# Patient Record
Sex: Male | Born: 1950 | Hispanic: No | Smoking: Never smoker
Health system: Southern US, Community
[De-identification: ages and names within clinical notes are randomized; demographics above are authoritative.]

## PROBLEM LIST (undated history)

## (undated) DIAGNOSIS — R519 Headache, unspecified: Secondary | ICD-10-CM

## (undated) DIAGNOSIS — I82409 Acute embolism and thrombosis of unspecified deep veins of unspecified lower extremity: Secondary | ICD-10-CM

## (undated) DIAGNOSIS — E785 Hyperlipidemia, unspecified: Secondary | ICD-10-CM

## (undated) DIAGNOSIS — S99921A Unspecified injury of right foot, initial encounter: Secondary | ICD-10-CM

## (undated) DIAGNOSIS — G8929 Other chronic pain: Secondary | ICD-10-CM

## (undated) DIAGNOSIS — K635 Polyp of colon: Secondary | ICD-10-CM

## (undated) DIAGNOSIS — C4491 Basal cell carcinoma of skin, unspecified: Secondary | ICD-10-CM

## (undated) HISTORY — DX: Unspecified injury of right foot, initial encounter: S99.921A

## (undated) HISTORY — DX: Basal cell carcinoma of skin, unspecified: C44.91

## (undated) HISTORY — DX: Acute embolism and thrombosis of unspecified deep veins of unspecified lower extremity: I82.409

## (undated) HISTORY — PX: NO PAST SURGERIES: SHX2092

## (undated) HISTORY — DX: Headache, unspecified: R51.9

## (undated) HISTORY — DX: Polyp of colon: K63.5

## (undated) HISTORY — PX: HERNIA REPAIR: SHX51

## (undated) HISTORY — DX: Other chronic pain: G89.29

## (undated) HISTORY — DX: Hyperlipidemia, unspecified: E78.5

---

## 2010-12-24 ENCOUNTER — Ambulatory Visit
Admission: RE | Admit: 2010-12-24 | Discharge: 2010-12-24 | Payer: Self-pay | Source: Home / Self Care | Attending: Cardiovascular Disease | Admitting: Cardiovascular Disease

## 2010-12-24 DIAGNOSIS — I82409 Acute embolism and thrombosis of unspecified deep veins of unspecified lower extremity: Secondary | ICD-10-CM | POA: Insufficient documentation

## 2011-01-08 ENCOUNTER — Ambulatory Visit: Admission: RE | Admit: 2011-01-08 | Discharge: 2011-01-08 | Payer: Self-pay | Source: Home / Self Care

## 2011-01-08 ENCOUNTER — Encounter: Payer: Self-pay | Admitting: Cardiovascular Disease

## 2011-01-14 NOTE — Assessment & Plan Note (Signed)
Summary: NPV/PT HAS BLOOD CLOT IN LEG   Visit Type:  Initial Consult Primary Provider:  none  CC:  right leg blood clot.  History of Present Illness: 60 year-old male presents for initial evaluation of DVT. This was first diagnosed in [place] over one year ago. The patient presented with chronic leg swelling at the time of diagnosis. He had a right foot injury 10 years ago involving a lawnmower and has some degree of swelling ever since then. However the right leg has been more swollen over the past one year. The patient reports no change in leg swelling since warfarin was started. Since the patient has moved to this country several months ago, his INR's have not been checked.   The patient denies chest pain, dyspnea, orthopnea, PND, palpitations, lightheadedness, or syncope. He denies left leg swelling. He has no history of PE or arterial thrombosis.   Current Medications (verified): 1)  Coumadin 2.5 Mg Tabs (Warfarin Sodium) .... Take As Directed  Allergies (verified): No Known Drug Allergies  Past History:  Past medical, surgical, family and social histories (including risk factors) reviewed, and no changes noted (except as noted below).  Past Medical History: None  Past Surgical History: Hernia repair x 3 - remote  Family History: Reviewed history and no changes required. Mother died age 17 from cancer Father died age 70 from MI  Social History: Reviewed history and no changes required. Divorced No children Nonsmoker Occasional Etoh Used to work as a Microbiologist       Negative except as per HPI   Vital Signs:  Patient profile:   60 year old male Height:      70 inches Weight:      210.50 pounds BMI:     30.31 Pulse rate:   64 / minute Pulse rhythm:   regular Resp:     18 per minute BP sitting:   110 / 72  (left arm) Cuff size:   large  Vitals Entered By: Vikki Ports (December 24, 2010 2:07 PM)  Serial Vital Signs/Assessments:  Time       Position  BP       Pulse  Resp  Temp     By           R Arm     110/70                         Vikki Ports   Physical Exam  General:  Pt is alert and oriented, in no acute distress. HEENT: normal Neck: normal carotid upstrokes without bruits, JVP normal Lungs: CTA CV: RRR without murmur or gallop Abd: soft, NT, positive BS, no bruit, no organomegaly Ext: 1+ edema right calf. No thigh edema. Varicosities present bilaterally. Chronic stasis dermatitis left lower leg. 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 He did not have much improvement from warfarin and now has been on chronic anticoagulation for greater than one year (unmonitored for past several months). Fortunately he has had no bleeding problems.  I have recommended repeating a venous duplex of the right leg to assess for chronic DVT. Pending these results will likely discontinue warfarin at this point, especially considering the patient's financial constraints and difficulty with long-term monitoring.  Treatment will likely consist of compression and elevation,  which he is already doing on an as needed basis. Will followup with him when the results of his duplex is available.  Orders: Venous Duplex Lower Extremity (Venous Duplex Lower)  Patient Instructions: 1)  Your physician recommends that you schedule a follow-up appointment as needed. 2)  Your physician recommends that you continue on your current medications as directed. Please refer to the Current Medication list given to you today. 3)  Your physician has requested that you have a RIGHT lower extremity venous duplex.  This test is an ultrasound of the veins in the legs.  It looks at venous blood flow that carries blood from the heart to the legs.  Allow one hour for a Lower Venous exam.  There are no restrictions or  special instructions.

## 2012-06-16 ENCOUNTER — Telehealth: Payer: Self-pay | Admitting: Cardiovascular Disease

## 2012-06-16 DIAGNOSIS — E7800 Pure hypercholesterolemia, unspecified: Secondary | ICD-10-CM

## 2012-06-16 MED ORDER — ROSUVASTATIN CALCIUM 20 MG PO TABS: 20.0000 mg | ORAL_TABLET | Freq: Every day | ORAL | Status: DC

## 2012-06-16 NOTE — Addendum Note (Signed)
Addended by: Dossie Arbour on: 06/16/2012 12:13 PM   Modules accepted: Orders

## 2012-06-16 NOTE — Telephone Encounter (Signed)
Pt is the brother of one of our cath lab nurses. He needs to have Crestor 20 mg po QHS called into [facility] on [address] with 90 pills and 3 refills. Thanks, Media planner

## 2012-07-10 ENCOUNTER — Other Ambulatory Visit: Payer: Self-pay

## 2012-07-10 DIAGNOSIS — I82409 Acute embolism and thrombosis of unspecified deep veins of unspecified lower extremity: Secondary | ICD-10-CM

## 2012-07-14 ENCOUNTER — Other Ambulatory Visit (INDEPENDENT_AMBULATORY_CARE_PROVIDER_SITE_OTHER): Payer: Medicaid Other

## 2012-07-14 DIAGNOSIS — I82409 Acute embolism and thrombosis of unspecified deep veins of unspecified lower extremity: Secondary | ICD-10-CM

## 2012-07-14 LAB — HEPATIC FUNCTION PANEL
Albumin: 4.2 g/dL (ref 3.5–5.2)
Alkaline Phosphatase: 41 U/L (ref 39–117)
Total Protein: 7.2 g/dL (ref 6.0–8.3)

## 2012-07-14 LAB — BASIC METABOLIC PANEL
BUN: 25 mg/dL — ABNORMAL HIGH (ref 6–23)
CO2: 26 mEq/L (ref 19–32)
Calcium: 9.1 mg/dL (ref 8.4–10.5)
GFR: 69.16 mL/min (ref >60.00)
Glucose, Bld: 105 mg/dL — ABNORMAL HIGH (ref 70–99)
Sodium: 138 mEq/L (ref 135–145)

## 2012-07-14 LAB — LIPID PANEL
Cholesterol: 173 mg/dL (ref 0–200)
HDL: 52.6 mg/dL (ref >39.00)

## 2012-07-25 ENCOUNTER — Encounter: Payer: Self-pay | Admitting: Nurse Practitioner

## 2012-07-25 ENCOUNTER — Ambulatory Visit (INDEPENDENT_AMBULATORY_CARE_PROVIDER_SITE_OTHER): Payer: Medicaid Other | Admitting: Nurse Practitioner

## 2012-07-25 VITALS — BP 130/86 | HR 55 | Ht 70.0 in | Wt 209.1 lb

## 2012-07-25 DIAGNOSIS — I82409 Acute embolism and thrombosis of unspecified deep veins of unspecified lower extremity: Secondary | ICD-10-CM

## 2012-07-25 DIAGNOSIS — E785 Hyperlipidemia, unspecified: Secondary | ICD-10-CM

## 2012-07-25 MED ORDER — ATORVASTATIN CALCIUM 40 MG PO TABS: 40.0000 mg | ORAL_TABLET | Freq: Every day | ORAL | Status: DC

## 2012-07-25 NOTE — Assessment & Plan Note (Signed)
His labs are reviewed. I encouraged him to cut back on calories and sugar. Stay active. We will switch him over to generic Lipitor 40 mg. Recheck labs in 3 months. He will see Dr. Excell Seltzer back in one year. Patient is agreeable to this plan and will call if any problems develop in the interim.

## 2012-07-25 NOTE — Addendum Note (Signed)
Addended by: Vista Mink D on: 07/25/2012 09:10 AM   Modules accepted: Orders

## 2012-07-25 NOTE — Assessment & Plan Note (Signed)
Has been having more swelling and some discomfort in the right leg. Will arrange for venous duplex. He is on aspirin. I have encouraged him to use support stockings.

## 2012-07-25 NOTE — Progress Notes (Signed)
   Andrew [name] Date of Birth: 1951-03-06 Medical Record #[id number]  History of Present Illness: Mr. Schubach is seen today for a follow up visit. He is seen for Dr. Excell Seltzer. He has had a history of HLD and prior DVT. Has been off coumadin for over one year. He is here with his brother [name] who works in the cath lab at [employer].   He comes in today. He is doing ok. Tries to walk about every other day. Does like to cook. Has had recent labs which were ok but the Crestor is quite expensive. No chest pain. Not short of breath. Right leg remains swollen and is sore. He wears a brace but no support stocking. He is on aspirin therapy.   Current Outpatient Prescriptions on File Prior to Visit  Medication Sig Dispense Refill  . atorvastatin (LIPITOR) 40 MG tablet Take 1 tablet (40 mg total) by mouth daily.  90 tablet  3    Not on File  Past Medical History  Diagnosis Date  . DVT (deep venous thrombosis)     right leg in 2011; prior injury to right foot  . HLD (hyperlipidemia)   . Right foot injury     History reviewed. No pertinent past surgical history.  History  Smoking status  . Former Smoker  . Types: Cigarettes  . Quit date: 07/25/1982  Smokeless tobacco  . Never Used  Comment: REMOTE SOCIAL HISTORY    History  Alcohol Use No    History reviewed. No pertinent family history.  Review of Systems: The review of systems is per the HPI.  All other systems were reviewed and are negative.  Physical Exam: BP 130/86  Pulse 55  Ht 5\' 10"  (1.778 m)  Wt 209 lb 1.9 oz (94.856 kg)  BMI 30.01 kg/m2 Patient is very pleasant and in no acute distress. He is overweight. Skin is warm and dry. Color is normal.  HEENT is unremarkable. Normocephalic/atraumatic. PERRL. Sclera are nonicteric. Neck is supple. No masses. No JVD. Lungs are clear. Cardiac exam shows a regular rate and rhythm. Abdomen is soft. Extremities are with 1+ edema on the right with brawny stasis changes. Left leg is ok. Gait  and ROM are intact. No gross neurologic deficits noted.   LABORATORY DATA:  Lab Results  Component Value Date   GLUCOSE 105* 07/14/2012   CHOL 173 07/14/2012   TRIG 99.0 07/14/2012   HDL 52.60 07/14/2012   LDLCALC 101* 07/14/2012   ALT 37 07/14/2012   AST 28 07/14/2012   NA 138 07/14/2012   K 5.1 07/14/2012   CL 104 07/14/2012   CREATININE 1.1 07/14/2012   BUN 25* 07/14/2012   CO2 26 07/14/2012     Assessment / Plan:

## 2012-07-25 NOTE — Patient Instructions (Addendum)
Finish your Crestor and then switch to Lipitor 40 mg daily. This prescription is at the drug store  We are going to get a duplex on your right leg.  Try to wear a support stockings  We will see you back for labs in 3 months  Try to cut back on your calories and stay active  See Dr. Excell Seltzer in one year.  Call the [facility] office at [phone] if you have any questions, problems or concerns.

## 2012-08-02 ENCOUNTER — Encounter (INDEPENDENT_AMBULATORY_CARE_PROVIDER_SITE_OTHER): Payer: Medicaid Other

## 2012-08-02 DIAGNOSIS — Z86718 Personal history of other venous thrombosis and embolism: Secondary | ICD-10-CM

## 2012-08-02 DIAGNOSIS — E785 Hyperlipidemia, unspecified: Secondary | ICD-10-CM

## 2012-08-02 DIAGNOSIS — I87009 Postthrombotic syndrome without complications of unspecified extremity: Secondary | ICD-10-CM

## 2012-08-02 DIAGNOSIS — M7989 Other specified soft tissue disorders: Secondary | ICD-10-CM

## 2012-08-02 DIAGNOSIS — M79609 Pain in unspecified limb: Secondary | ICD-10-CM

## 2012-08-08 ENCOUNTER — Telehealth: Payer: Self-pay | Admitting: *Deleted

## 2012-08-08 NOTE — Telephone Encounter (Signed)
Patient brother returning nurse call, he can be reached at [phone]

## 2012-08-08 NOTE — Telephone Encounter (Signed)
Message copied by Awilda Bill on Tue Aug 08, 2012 12:03 PM ------      Message from: Rosalio Macadamia      Created: Fri Aug 04, 2012  4:31 PM       Ok to report. His doppler appears unchanged from his prior study. Would recommend medium compression support stockings.

## 2012-08-08 NOTE — Telephone Encounter (Signed)
Dr Eden Emms reviewed the pt's LEV study and recommended anticoagulation.  Dr Excell Seltzer aware and will have the pt take Xarelto 15mg  take one by mouth twice a day for 3 weeks and then go to Xarelto 20mg  daily.  Dr Excell Seltzer spoke with the pt's brother Maryann Conners about starting Xarelto.  I did place a 3 week supply of Xarelto 15mg  at the front desk with Rx for 20mg .

## 2012-08-08 NOTE — Telephone Encounter (Signed)
LMTCB

## 2012-10-02 ENCOUNTER — Encounter: Payer: Self-pay | Admitting: Vascular Surgery

## 2012-10-03 ENCOUNTER — Ambulatory Visit (INDEPENDENT_AMBULATORY_CARE_PROVIDER_SITE_OTHER): Payer: Medicaid Other | Admitting: Vascular Surgery

## 2012-10-03 ENCOUNTER — Encounter: Payer: Self-pay | Admitting: Vascular Surgery

## 2012-10-03 VITALS — BP 137/85 | HR 57 | Resp 20 | Ht 70.0 in | Wt 204.0 lb

## 2012-10-03 DIAGNOSIS — I82401 Acute embolism and thrombosis of unspecified deep veins of right lower extremity: Secondary | ICD-10-CM | POA: Insufficient documentation

## 2012-10-03 DIAGNOSIS — I82409 Acute embolism and thrombosis of unspecified deep veins of unspecified lower extremity: Secondary | ICD-10-CM

## 2012-10-03 DIAGNOSIS — M79604 Pain in right leg: Secondary | ICD-10-CM | POA: Insufficient documentation

## 2012-10-03 DIAGNOSIS — M79609 Pain in unspecified limb: Secondary | ICD-10-CM

## 2012-10-03 DIAGNOSIS — M7989 Other specified soft tissue disorders: Secondary | ICD-10-CM | POA: Insufficient documentation

## 2012-10-03 NOTE — Progress Notes (Signed)
Subjective:     Patient ID: Andrew [name], male   DOB: June 07, 1951, 61 y.o.   MRN: [id number]  HPI this 61 year old male was referred by Dr. Tonny Bollman for suggestions regarding chronic DVT in the right leg. Apparently the patient had a DVT about 3-4 years ago. 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 He had a repeat ultrasound performed August of 2013 which reveals residual thrombus in the mid to right distal right SFV and popliteal vein. He was on Coumadin originally but recently has been changed toXeralto. He has no history of pulmonary embolism or other thrombotic problems. He has chronic swelling in the right leg and has noticed some skin changes. He has no history of stasis ulcers or bleeding. He has no swelling in the contralateral left leg. He does not elevate his leg on a regular basis nor use elastic stockings.  Past Medical History  Diagnosis Date  . DVT (deep venous thrombosis)     right leg in 2011; prior injury to right foot  . HLD (hyperlipidemia)   . Right foot injury     History  Substance Use Topics  . Smoking status: Former Smoker    Types: Cigarettes    Quit date: 07/25/1982  . Smokeless tobacco: Never Used   Comment: REMOTE SOCIAL HISTORY  . Alcohol Use: No    Family History  Problem Relation Age of Onset  . Heart disease Mother   . Heart disease Father   . Vascular Disease Maternal Grandfather     No Known Allergies  Current outpatient prescriptions:aspirin 81 MG tablet, Take 81 mg by mouth daily., Disp: , Rfl: ;  atorvastatin (LIPITOR) 40 MG tablet, Take 1 tablet (40 mg total) by mouth daily., Disp: 90 tablet, Rfl: 3;  Rivaroxaban (XARELTO) 15 MG TABS tablet, Take one tablet by mouth twice a day for 3 weeks and then go to 20mg  daily, Disp: 1 tablet, Rfl:   BP 137/85  Pulse 57  Resp 20  Ht 5\' 10"  (1.778 m)  Wt 204 lb (92.534 kg)  BMI 29.27 kg/m2  Body mass index is  29.27 kg/(m^2).         Review of Systems denies chest pain, dyspnea on exertion, PND, orthopnea. Does have discomfort in his legs while lying flat, chronic swelling, varicose veins in the right leg, numbness in his legs, ulcers     Objective:   Physical Exam blood pressure 137/85 heart rate 70 respirations 20 Gen.-alert and oriented x3 in no apparent distress HEENT normal for age Lungs no rhonchi or wheezing Cardiovascular regular rhythm no murmurs carotid pulses 3+ palpable no bruits audible Abdomen soft nontender no palpable masses Musculoskeletal free of  major deformities Skin clear -no rashes Neurologic normal Lower extremities 3+ femoral and dorsalis pedis pulses palpable bilaterally Right leg with chronic edema from the knee distally with some areas of hyperpigmentation but no active ulceration. Edema is 1-2+. Left leg has no edema. 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 #3 would consider discontinuing anticoagulation in another 6 months and use aspirin only #4 if patient develops recurrent DVT we'll then need anticoagulation for life-only one documented episode of DVT at this time No further suggestions

## 2012-10-11 ENCOUNTER — Other Ambulatory Visit: Payer: Self-pay | Admitting: *Deleted

## 2012-10-11 DIAGNOSIS — R001 Bradycardia, unspecified: Secondary | ICD-10-CM | POA: Insufficient documentation

## 2012-10-11 DIAGNOSIS — E785 Hyperlipidemia, unspecified: Secondary | ICD-10-CM

## 2012-10-11 DIAGNOSIS — I82409 Acute embolism and thrombosis of unspecified deep veins of unspecified lower extremity: Secondary | ICD-10-CM

## 2012-10-13 ENCOUNTER — Other Ambulatory Visit: Payer: Medicaid Other

## 2013-01-16 ENCOUNTER — Encounter: Payer: Self-pay | Admitting: Nurse Practitioner

## 2013-01-16 ENCOUNTER — Ambulatory Visit (INDEPENDENT_AMBULATORY_CARE_PROVIDER_SITE_OTHER): Payer: Medicaid Other | Admitting: Nurse Practitioner

## 2013-01-16 ENCOUNTER — Encounter (INDEPENDENT_AMBULATORY_CARE_PROVIDER_SITE_OTHER): Payer: Medicaid Other

## 2013-01-16 VITALS — BP 112/76 | HR 52 | Ht 70.0 in | Wt 203.1 lb

## 2013-01-16 DIAGNOSIS — E785 Hyperlipidemia, unspecified: Secondary | ICD-10-CM

## 2013-01-16 DIAGNOSIS — I82409 Acute embolism and thrombosis of unspecified deep veins of unspecified lower extremity: Secondary | ICD-10-CM

## 2013-01-16 DIAGNOSIS — I87009 Postthrombotic syndrome without complications of unspecified extremity: Secondary | ICD-10-CM

## 2013-01-16 NOTE — Progress Notes (Addendum)
Andrew [name] Date of Birth: 25-Jun-1951 Medical Record #[id number]  History of Present Illness: Andrew [name] is seen back today for a follow up visit. He is seen for Dr. Excell [name]. He has a history of HLD and prior DVT. Has chronic swelling of the right leg. Xarelto started after his last duplex in August. Has seen Dr. Hart Rochester who evaluated the chronic obstruction in the mid SFV and popliteal vein on the right and recommended  #1 elevate foot of bed 3-4 inches; #2 needs to wear short leg elastic compression stockings on daily basis to be placed on first-line in a.m.; #3 would consider discontinuing anticoagulation in another 6 months and use aspirin only and #4 if patient develops recurrent DVT we'll then need anticoagulation for life-only one documented episode of DVT at this time.   He comes in today. He is here with his brother, Andrew [name].  Andrew [name] gives most of the history. No real problems heart wise. Not having chest pain. Not short of breath. Has been walking more. Has lost 6 pounds. His right leg has improved with the above measures. He denies any pain now. His swelling has improved. He is not having trouble affording the Xarelto. His only real issue is that he is having migraines with auras. Using Imitrex with relief. This started before he started his Xarelto. His 6 months of anticoagulation is almost up and Andrew [name] is worried about switching back over.   Current Outpatient Prescriptions on File Prior to Visit  Medication Sig Dispense Refill  . aspirin 81 MG tablet Take 81 mg by mouth daily.      Marland Kitchen atorvastatin (LIPITOR) 40 MG tablet Take 1 tablet (40 mg total) by mouth daily.  90 tablet  3  . SUMAtriptan (IMITREX) 100 MG tablet Take 100 mg by mouth as needed.        Allergies  Allergen Reactions  . Influenza Vaccines     Broke out in hives and had breathing problems    Past Medical History  Diagnosis Date  . DVT (deep venous thrombosis)     right leg in 2011; prior injury to right foot    . HLD (hyperlipidemia)   . Right foot injury     History reviewed. No pertinent past surgical history.  History  Smoking status  . Former Smoker  . Types: Cigarettes  . Quit date: 07/25/1982  Smokeless tobacco  . Never Used    Comment: REMOTE SOCIAL HISTORY    History  Alcohol Use No    Family History  Problem Relation Age of Onset  . Heart disease Mother   . Heart disease Father   . Vascular Disease Maternal Grandfather     Review of Systems: The review of systems is per the HPI.  All other systems were reviewed and are negative.  Physical Exam: BP 112/76  Pulse 52  Ht 5\' 10"  (1.778 m)  Wt 203 lb 1.9 oz (92.135 kg)  BMI 29.14 kg/m2 Patient is very pleasant and in no acute distress. [place] accent. Skin is warm and dry. Color is normal.  HEENT is unremarkable. Normocephalic/atraumatic. PERRL. Sclera are nonicteric. Neck is supple. No masses. No JVD. Lungs are clear. Cardiac exam shows a regular rate and rhythm. Abdomen is soft. Extremities are without edema. He has a support stocking on the right leg. Some varicosities on the left leg noted. His swelling is greatly improved from what I remember. Gait and ROM are intact. No gross neurologic deficits noted.   LABORATORY DATA:  Lab Results  Component Value Date   GLUCOSE 105* 07/14/2012   CHOL 173 07/14/2012   TRIG 99.0 07/14/2012   HDL 52.60 07/14/2012   LDLCALC 101* 07/14/2012   ALT 37 07/14/2012   AST 28 07/14/2012   NA 138 07/14/2012   K 5.1 07/14/2012   CL 104 07/14/2012   CREATININE 1.1 07/14/2012   BUN 25* 07/14/2012   CO2 26 07/14/2012     Assessment / Plan: 1. HLD - on statin therapy  2. Remote DVT with chronic swelling of the right leg - his leg looks better to me. Andrew [name] is worried about the chronic thrombus. Will recheck his duplex. They are not adverse to staying on the Xarelto long term. Expense is not an issue. Will discuss with Dr. Excell [name] as well.   Patient is agreeable to this plan and will call if any problems  develop in the interim.    Addendum 01/27/13 Notes Recorded by Tonny Bollman, MD on 01/26/2013 at 11:42 AM Discussed result with his brother, Andrew [name], who works in the cath lab. I have recommended stopping anticoagulation and starting ASA 81 mg daily

## 2013-01-16 NOTE — Patient Instructions (Addendum)
Stay on your current medicines  We will check a duplex of your right leg  If this is ok, we will stop your Xarelto and go back to aspirin - we will be letting you know  Call the [facility] office at [phone] if you have any questions, problems or concerns.

## 2013-01-31 ENCOUNTER — Other Ambulatory Visit: Payer: Self-pay

## 2013-08-11 ENCOUNTER — Emergency Department (HOSPITAL_BASED_OUTPATIENT_CLINIC_OR_DEPARTMENT_OTHER)
Admission: EM | Admit: 2013-08-11 | Discharge: 2013-08-11 | Disposition: A | Discharge status: Home / Self Care | Payer: Medicaid Other | Attending: Emergency Medicine | Admitting: Emergency Medicine

## 2013-08-11 ENCOUNTER — Encounter (HOSPITAL_BASED_OUTPATIENT_CLINIC_OR_DEPARTMENT_OTHER): Payer: Self-pay | Admitting: *Deleted

## 2013-08-11 ENCOUNTER — Emergency Department (HOSPITAL_BASED_OUTPATIENT_CLINIC_OR_DEPARTMENT_OTHER): Payer: Medicaid Other

## 2013-08-11 DIAGNOSIS — W19XXXA Unspecified fall, initial encounter: Secondary | ICD-10-CM

## 2013-08-11 DIAGNOSIS — R296 Repeated falls: Secondary | ICD-10-CM | POA: Insufficient documentation

## 2013-08-11 DIAGNOSIS — R55 Syncope and collapse: Secondary | ICD-10-CM | POA: Insufficient documentation

## 2013-08-11 DIAGNOSIS — Y92009 Unspecified place in unspecified non-institutional (private) residence as the place of occurrence of the external cause: Secondary | ICD-10-CM | POA: Insufficient documentation

## 2013-08-11 DIAGNOSIS — E785 Hyperlipidemia, unspecified: Secondary | ICD-10-CM | POA: Insufficient documentation

## 2013-08-11 DIAGNOSIS — Y9389 Activity, other specified: Secondary | ICD-10-CM | POA: Insufficient documentation

## 2013-08-11 DIAGNOSIS — Z86718 Personal history of other venous thrombosis and embolism: Secondary | ICD-10-CM | POA: Insufficient documentation

## 2013-08-11 DIAGNOSIS — Z79899 Other long term (current) drug therapy: Secondary | ICD-10-CM | POA: Insufficient documentation

## 2013-08-11 DIAGNOSIS — S161XXA Strain of muscle, fascia and tendon at neck level, initial encounter: Secondary | ICD-10-CM

## 2013-08-11 DIAGNOSIS — Z87828 Personal history of other (healed) physical injury and trauma: Secondary | ICD-10-CM | POA: Insufficient documentation

## 2013-08-11 DIAGNOSIS — Z7982 Long term (current) use of aspirin: Secondary | ICD-10-CM | POA: Insufficient documentation

## 2013-08-11 DIAGNOSIS — S139XXA Sprain of joints and ligaments of unspecified parts of neck, initial encounter: Secondary | ICD-10-CM | POA: Insufficient documentation

## 2013-08-11 LAB — CBC WITH DIFFERENTIAL/PLATELET
Basophils Relative: 0 % (ref 0–1)
Eosinophils Absolute: 0.1 10*3/uL (ref 0.0–0.7)
MCH: 30.1 pg (ref 26.0–34.0)
MCHC: 32.5 g/dL (ref 30.0–36.0)
Monocytes Relative: 7 % (ref 3–12)
Neutrophils Relative %: 74 % (ref 43–77)
Platelets: 221 10*3/uL (ref 150–400)
RDW: 12.8 % (ref 11.5–15.5)

## 2013-08-11 LAB — BASIC METABOLIC PANEL
BUN: 18 mg/dL (ref 6–23)
GFR calc Af Amer: 73 mL/min — ABNORMAL LOW (ref >90)
GFR calc non Af Amer: 63 mL/min — ABNORMAL LOW (ref >90)
Potassium: 4.8 mEq/L (ref 3.5–5.1)
Sodium: 138 mEq/L (ref 135–145)

## 2013-08-11 MED ORDER — DIAZEPAM 5 MG PO TABS: 5.0000 mg | ORAL_TABLET | Freq: Every evening | ORAL | Status: DC | PRN

## 2013-08-11 MED ORDER — IBUPROFEN 800 MG PO TABS
800.0000 mg | ORAL_TABLET | Freq: Once | ORAL | Status: AC
  Administered 2013-08-11: 800 mg via ORAL
  Filled 2013-08-11: qty 1

## 2013-08-11 MED ORDER — IBUPROFEN 800 MG PO TABS: 800.0000 mg | ORAL_TABLET | Freq: Three times a day (TID) | ORAL | Status: DC

## 2013-08-11 MED ORDER — DIAZEPAM 5 MG PO TABS
5.0000 mg | ORAL_TABLET | Freq: Once | ORAL | Status: AC
  Administered 2013-08-11: 5 mg via ORAL
  Filled 2013-08-11: qty 1

## 2013-08-11 NOTE — ED Provider Notes (Signed)
CSN: [id number]     Arrival date & time 08/11/13  1027 History   First MD Initiated Contact with Patient 08/11/13 1100     Chief Complaint  Patient presents with  . Fall   (Consider location/radiation/quality/duration/timing/severity/associated sxs/prior Treatment) HPI This is a 62 year old male with a history of hyperlipidemia, and DVT who presents with a fall. The patient speaks limited English and history is taken from his family.  Per report, the patient was outside working 2 days ago when he bent over and felt dizzy.  He fell. It is unsure whether he lost consciousness. He was noted to have an abrasion on his head. Since that time he has been complaining right neck pain.  He denies any focal weakness or numbness. He denies any urinary retention or bowel problems. Patient has not had any vomiting. He is not currently on any anticoagulants. He does take a baby aspirin. Patient denies any chest pain, shortness of breath, abdominal pain, urinary symptoms, or lower extremity swelling.  Past Medical History  Diagnosis Date  . DVT (deep venous thrombosis)     right leg in 2011; prior injury to right foot  . HLD (hyperlipidemia)   . Right foot injury    No past surgical history on file. Family History  Problem Relation Age of Onset  . Heart disease Mother   . Heart disease Father   . Vascular Disease Maternal Grandfather    History  Substance Use Topics  . Smoking status: Former Smoker    Types: Cigarettes    Quit date: 07/25/1982  . Smokeless tobacco: Never Used     Comment: REMOTE SOCIAL HISTORY  . Alcohol Use: No    Review of Systems  Constitutional: Negative.  Negative for fever.  HENT: Positive for neck pain.   Respiratory: Negative.  Negative for chest tightness and shortness of breath.   Cardiovascular: Negative.  Negative for chest pain.  Gastrointestinal: Negative.  Negative for abdominal pain.  Genitourinary: Negative.        No urinary retention  Musculoskeletal:  Negative for back pain.  Skin: Positive for wound.  Neurological: Positive for headaches.  All other systems reviewed and are negative.    Allergies  Influenza vaccines  Home Medications   Current Outpatient Rx  Name  Route  Sig  Dispense  Refill  . aspirin 81 MG tablet   Oral   Take 81 mg by mouth daily.         Marland Kitchen EXPIRED: atorvastatin (LIPITOR) 40 MG tablet   Oral   Take 1 tablet (40 mg total) by mouth daily.   90 tablet   3   . SUMAtriptan (IMITREX) 100 MG tablet   Oral   Take 100 mg by mouth as needed.          BP 125/71  Pulse 64  Temp(Src) 97.6 F (36.4 C) (Oral)  Resp 22  Ht 5\' 10"  (1.778 m)  Wt 188 lb (85.276 kg)  BMI 26.98 kg/m2  SpO2 97% Physical Exam  Nursing note and vitals reviewed. Constitutional: He is oriented to person, place, and time. He appears well-developed and well-nourished. No distress.  HENT:  Head: Normocephalic.  Oversized abrasion to the vertex of the head  Eyes: Pupils are equal, round, and reactive to light.  Neck: Neck supple.  No midline tenderness to palpation. Tenderness to palpation of the right paraspinous muscles cervical spine  Cardiovascular: Normal rate, regular rhythm and normal heart sounds.   No murmur heard. Pulmonary/Chest: Effort  normal and breath sounds normal. No respiratory distress. He has no wheezes.  Abdominal: Soft. Bowel sounds are normal. There is no tenderness. There is no rebound.  Musculoskeletal: He exhibits no edema.  No Homans sign   Lymphadenopathy:    He has no cervical adenopathy.  Neurological: He is alert and oriented to person, place, and time.  Strength 5 out of 5 in all 4 extremities. No ataxia noted. Gait normal. Deep tendon reflexes within normal limits.  Skin: Skin is warm and dry.  Psychiatric: He has a normal mood and affect.    ED Course  Procedures (including critical care time) Labs Review Labs Reviewed  BASIC METABOLIC PANEL - Abnormal; Notable for the following:     GFR calc non Af Amer 63 (*)    GFR calc Af Amer 73 (*)    All other components within normal limits  CBC WITH DIFFERENTIAL   Imaging Review Dg Cervical Spine Complete  08/11/2013   *RADIOLOGY REPORT*  Clinical Data: Fall  CERVICAL SPINE - COMPLETE 4+ VIEW  Comparison: None.  Findings: Normal alignment and no fracture.  Disc degeneration and spondylosis C5-6 and C6-7 causing foraminal encroachment.  Negative for fracture.  IMPRESSION: Negative for fracture.   Original Report Authenticated By: Janeece Riggers, M.D.   Dg Hand Complete Left  08/11/2013   *RADIOLOGY REPORT*  Clinical Data: Fall.  Pain middle finger  LEFT HAND - COMPLETE 3+ VIEW  Comparison: None  Findings: Normal alignment and no fracture.  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  No significant joint space narrowing of the wrist.  IMPRESSION: Negative for fracture.   Original Report Authenticated By: Janeece Riggers, M.D.   EKG independently reviewed by myself: Sinus rhythm with a rate of 57, no evidence of ST elevation or ischemia, no interval prolongation or arrhythmia noted. No prior for comparison MDM   1. Fall at home, initial encounter   2. Cervical strain, acute, initial encounter   3. Near syncope    This is a 62 year old who presents following a fall 2 days ago. He is nontoxic-appearing on exam. Exam is notable for tenderness to palpation over the right neck as well as a small abrasion over the 4 head. He is awake alert and oriented in his neurologic exam is intact. Near syncopal workup will be initiated including EKG, orthostatics, and basic lab work. Plain films of the cervical spine were obtained and are negative for acute fracture. Patient was given Valium and ibuprofen with improvement of his pain. Syncopal workup is negative at this time. Patient does have a history of DVT but denies any chest pain, shortness of breath or lower extremity swelling. My suspicion is that his dizziness may be related to dehydration  and/or heat as it was late in the afternoon. The patient will be discharged home. Patient was given strict return precautions.  After history, exam, and medical workup I feel the patient has been appropriately medically screened and is safe for discharge home. Pertinent diagnoses were discussed with the patient. Patient was given return precautions.    Shon Baton, MD 08/11/13 1259

## 2013-08-11 NOTE — ED Notes (Signed)
Patient fell Thursday whild working outside and fell on head. C/o back & neck pain

## 2013-08-21 ENCOUNTER — Other Ambulatory Visit: Payer: Self-pay | Admitting: Nurse Practitioner

## 2013-09-10 ENCOUNTER — Ambulatory Visit (INDEPENDENT_AMBULATORY_CARE_PROVIDER_SITE_OTHER): Payer: Medicaid Other | Admitting: Nurse Practitioner

## 2013-09-10 ENCOUNTER — Encounter: Payer: Self-pay | Admitting: Nurse Practitioner

## 2013-09-10 VITALS — BP 110/70 | HR 60 | Ht 70.0 in | Wt 202.0 lb

## 2013-09-10 DIAGNOSIS — R55 Syncope and collapse: Secondary | ICD-10-CM

## 2013-09-10 DIAGNOSIS — E785 Hyperlipidemia, unspecified: Secondary | ICD-10-CM

## 2013-09-10 NOTE — Patient Instructions (Addendum)
We will arrange for a treadmill test  We will check fasting labs on the day of the treadmill test  Continue with current medicines  Call the [facility] office at [phone] if you have any questions, problems or concerns.

## 2013-09-10 NOTE — Progress Notes (Signed)
Andrew [name] Date of Birth: [birth year] Medical Record #[id number]  History of Present Illness: He is seen for Dr. Excell Seltzer. He has a history of HLD and prior DVT. Has chronic swelling of the right leg. Xarelto started after his last duplex in August. Has seen Dr. 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   Seen 6 months ago. Was doing ok. He has been maintained on low dose aspirin.   Comes back today. Here with [name], his brother. [name] provides a lot of the history due to the language barrier. [place] has been doing ok. No chest pain. Does have some DOE with steps. Did have a spell back in the summer where he may have had a syncopal spell - was working in the yard - it was hot - sounds like he usually drinks enough - fell over and hit his head - went to the ER and had a negative CT of the head.  Baseline labs were ok. EKG was ok. He has had no recurrence since. He really does not remember much about the spell and could not provide a lot of information. Some mild cramps in his legs but his leg has been doing fine. He is elevating it and using compression stockings. No excessive swelling or redness. Remains on low dose Aspirin.       Current Outpatient Prescriptions  Medication Sig Dispense Refill  . aspirin 81 MG tablet Take 81 mg by mouth daily.      Marland Kitchen atorvastatin (LIPITOR) 40 MG tablet Take one tablet by mouth one time daily  30 tablet  4  . ibuprofen (ADVIL,MOTRIN) 800 MG tablet Take 1 tablet (800 mg total) by mouth 3 (three) times daily.  21 tablet  0   No current  facility-administered medications for this visit.    Allergies  Allergen Reactions  . Influenza Vaccines     Broke out in hives and had breathing problems    Past Medical History  Diagnosis Date  . DVT (deep venous thrombosis)     right leg in 2011; prior injury to right foot  . HLD (hyperlipidemia)   . Right foot injury     History reviewed. No pertinent past surgical history.  History  Smoking status  . Former Smoker  . Types: Cigarettes  . Quit date: 07/25/1982  Smokeless tobacco  . Never Used    Comment: REMOTE SOCIAL HISTORY    History  Alcohol Use No    Family History  Problem Relation Age of Onset  . Heart disease Mother   . Heart disease Father   . Vascular Disease Maternal Grandfather     Review of Systems: The review of systems is per the HPI.  All other systems were reviewed and are negative.  Physical Exam: BP 110/70  Pulse 60  Ht 5\' 10"  (1.778 m)  Wt 202 lb (91.627 kg)  BMI 28.98 kg/m2 Patient is very pleasant and in no acute distress. Very limited English. Skin is warm and dry. Color is normal.  HEENT is unremarkable. Normocephalic/atraumatic. PERRL. Sclera are nonicteric. Neck is supple. No  masses. No JVD. Lungs are clear. Cardiac exam shows a regular rate and rhythm. Abdomen is soft. Extremities are without edema. Gait and ROM are intact. No gross neurologic deficits noted.  LABORATORY DATA:  Lab Results  Component Value Date   WBC 6.9 08/11/2013   HGB 15.0 08/11/2013   HCT 46.1 08/11/2013   PLT 221 08/11/2013   GLUCOSE 93 08/11/2013   CHOL 173 07/14/2012   TRIG 99.0 07/14/2012   HDL 52.60 07/14/2012   LDLCALC 101* 07/14/2012   ALT 37 07/14/2012   AST 28 07/14/2012   NA 138 08/11/2013   K 4.8 08/11/2013   CL 102 08/11/2013   CREATININE 1.20 08/11/2013   BUN 18 08/11/2013   CO2 30 08/11/2013   No results found for this basename: CKTOTAL,  CKMB,  CKMBINDEX,  TROPONINI     Assessment / Plan: 1. DVT - on just aspirin therapy - doing ok.   2. HLD -  not fasting today - will arrange for fasting labs on the day of his GXT  3. Syncope/DOE - has a positive FH for CAD - will arrange for GXT testing to risk stratify.   Tentatively see him back in one year.   Patient is agreeable to this plan and will call if any problems develop in the interim.   Rosalio Macadamia, RN, ANP-C [facility] [address] [place], [place]  [address]

## 2013-10-23 ENCOUNTER — Other Ambulatory Visit (INDEPENDENT_AMBULATORY_CARE_PROVIDER_SITE_OTHER): Payer: Medicaid Other

## 2013-10-23 ENCOUNTER — Ambulatory Visit (INDEPENDENT_AMBULATORY_CARE_PROVIDER_SITE_OTHER): Payer: Medicaid Other | Admitting: Cardiovascular Disease

## 2013-10-23 DIAGNOSIS — E785 Hyperlipidemia, unspecified: Secondary | ICD-10-CM

## 2013-10-23 DIAGNOSIS — R55 Syncope and collapse: Secondary | ICD-10-CM

## 2013-10-23 LAB — LIPID PANEL
Cholesterol: 159 mg/dL (ref 0–200)
HDL: 44.3 mg/dL (ref >39.00)
LDL Cholesterol: 93 mg/dL (ref 0–99)
Total CHOL/HDL Ratio: 4
Triglycerides: 111 mg/dL (ref 0.0–149.0)
VLDL: 22.2 mg/dL (ref 0.0–40.0)

## 2013-10-23 LAB — HEPATIC FUNCTION PANEL
ALT: 30 U/L (ref 0–53)
AST: 26 U/L (ref 0–37)
Albumin: 3.8 g/dL (ref 3.5–5.2)
Alkaline Phosphatase: 38 U/L — ABNORMAL LOW (ref 39–117)
Bilirubin, Direct: 0 mg/dL (ref 0.0–0.3)
Total Bilirubin: 0.9 mg/dL (ref 0.3–1.2)
Total Protein: 6.9 g/dL (ref 6.0–8.3)

## 2013-10-23 NOTE — Progress Notes (Signed)
Exercise Treadmill Test  Pre-Exercise Testing Evaluation Rhythm: sinus bradycardia  Rate: 57 bpm     Test  Exercise Tolerance Test Ordering MD: Tonny Bollman, MD  Interpreting MD: Tonny Bollman, MD  Unique Test No: 1  Treadmill:  1  Indication for ETT: syncope  Contraindication to ETT: No   Stress Modality: exercise - treadmill  Cardiac Imaging Performed: non   Protocol: standard Bruce - maximal  Max BP:  181/91  Max MPHR (bpm):  157 85% MPR (bpm):  133  MPHR obtained (bpm):  153 % MPHR obtained:  97%  Reached 85% MPHR (min:sec):  5:15 Total Exercise Time (min-sec):  9:00  Workload in METS:  10.1 Borg Scale: 15  Reason ETT Terminated:  dyspnea    ST Segment Analysis At Rest: normal ST segments - no evidence of significant ST depression With Exercise: no evidence of significant ST depression  Other Information Arrhythmia:  No Angina during ETT:  absent (0) Quality of ETT:  diagnostic  ETT Interpretation:  normal - no evidence of ischemia by ST analysis  Comments: Good exercise tolerance. No angina, arrhythmia, or significant EKG changes with exertion.  Recommendations: Graded exercise program. No further cardiac evaluation indicated.

## 2013-10-24 ENCOUNTER — Telehealth: Payer: Self-pay | Admitting: Cardiovascular Disease

## 2013-10-24 NOTE — Telephone Encounter (Signed)
Left message on machine for pt's brother to contact the office.

## 2013-10-24 NOTE — Telephone Encounter (Signed)
Follow up   Pt returning call for results

## 2013-10-29 NOTE — Telephone Encounter (Signed)
Lab results were mailed to pt.  Notes Recorded by Debbe Bales on 10/26/2013 at 8:15 AM Mailed pt lab results

## 2014-02-09 ENCOUNTER — Other Ambulatory Visit: Payer: Self-pay | Admitting: Nurse Practitioner

## 2014-04-08 ENCOUNTER — Ambulatory Visit (INDEPENDENT_AMBULATORY_CARE_PROVIDER_SITE_OTHER): Payer: Medicaid Other | Admitting: *Deleted

## 2014-04-08 DIAGNOSIS — E785 Hyperlipidemia, unspecified: Secondary | ICD-10-CM

## 2014-04-08 DIAGNOSIS — I82409 Acute embolism and thrombosis of unspecified deep veins of unspecified lower extremity: Secondary | ICD-10-CM

## 2014-04-08 LAB — LIPID PANEL
Cholesterol: 170 mg/dL (ref 0–200)
HDL: 57.4 mg/dL (ref >39.00)
LDL Cholesterol: 97 mg/dL (ref 0–99)
TRIGLYCERIDES: 79 mg/dL (ref 0.0–149.0)
Total CHOL/HDL Ratio: 3
VLDL: 15.8 mg/dL (ref 0.0–40.0)

## 2014-04-08 LAB — HEPATIC FUNCTION PANEL
ALBUMIN: 4.2 g/dL (ref 3.5–5.2)
ALT: 36 U/L (ref 0–53)
AST: 26 U/L (ref 0–37)
Alkaline Phosphatase: 41 U/L (ref 39–117)
Bilirubin, Direct: 0 mg/dL (ref 0.0–0.3)
TOTAL PROTEIN: 7.4 g/dL (ref 6.0–8.3)
Total Bilirubin: 0.5 mg/dL (ref 0.3–1.2)

## 2014-04-08 LAB — BASIC METABOLIC PANEL
BUN: 20 mg/dL (ref 6–23)
CALCIUM: 9.3 mg/dL (ref 8.4–10.5)
CO2: 27 meq/L (ref 19–32)
CREATININE: 1 mg/dL (ref 0.4–1.5)
Chloride: 104 mEq/L (ref 96–112)
GFR: 82.86 mL/min (ref >60.00)
GLUCOSE: 98 mg/dL (ref 70–99)
Potassium: 4.2 mEq/L (ref 3.5–5.1)
Sodium: 138 mEq/L (ref 135–145)

## 2014-07-12 ENCOUNTER — Other Ambulatory Visit: Payer: Self-pay | Admitting: Nurse Practitioner

## 2014-08-07 ENCOUNTER — Ambulatory Visit: Payer: Medicaid Other | Admitting: Nurse Practitioner

## 2014-08-20 ENCOUNTER — Ambulatory Visit: Payer: Medicaid Other | Admitting: Cardiology

## 2014-09-03 ENCOUNTER — Ambulatory Visit (INDEPENDENT_AMBULATORY_CARE_PROVIDER_SITE_OTHER): Payer: Medicaid Other | Admitting: Nurse Practitioner

## 2014-09-03 ENCOUNTER — Encounter: Payer: Self-pay | Admitting: Nurse Practitioner

## 2014-09-03 VITALS — BP 140/70 | HR 62 | Ht 69.0 in | Wt 200.1 lb

## 2014-09-03 DIAGNOSIS — I82409 Acute embolism and thrombosis of unspecified deep veins of unspecified lower extremity: Secondary | ICD-10-CM

## 2014-09-03 DIAGNOSIS — E785 Hyperlipidemia, unspecified: Secondary | ICD-10-CM

## 2014-09-03 MED ORDER — ATORVASTATIN CALCIUM 40 MG PO TABS: ORAL_TABLET | ORAL | Status: DC

## 2014-09-03 NOTE — Patient Instructions (Addendum)
Stay on your current medicines  I refilled the Lipitor today  Stay active  See me or Dr. Burt Knack in one year.  Call the [place] office at [phone] if you have any questions, problems or concerns.

## 2014-09-03 NOTE — Progress Notes (Signed)
Marcial Pacas Date of Birth: [birth year] Medical Record #[id number]  History of Present Illness: Mr. Dhaliwal is seen back today for a one year check. He is seen for Dr. Burt Knack. He has a history of HLD and prior DVT. Has chronic swelling of the right leg. Has been on Xarelto in the past. Has also seen Dr. 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   Seen a year ago - was doing ok but had had a syncopal spell earlier last summer. Negative evaluation. Negative GXT back in November.   Comes back today. Here with [name], his brother. [name] provides a lot of the history due to the language barrier. [place] has been doing ok. On Prednisone for some back issues. BP good at home. No chest pain. Not short of breath. Swelling in his right leg stable - wearing his support stocking. Really has no issue. Needs Lipitor refilled. For physical with his PCP later this fall.   Current Outpatient Prescriptions  Medication Sig Dispense Refill  . aspirin 81 MG tablet Take 81 mg by mouth daily.      Marland Kitchen atorvastatin (LIPITOR) 40 MG tablet TAKE ONE TABLET BY MOUTH ONE TIME DAILY   30 tablet  3  . HYDROcodone-acetaminophen (NORCO/VICODIN) 5-325 MG per tablet Take 1 tablet by mouth as needed.       Marland Kitchen ibuprofen (ADVIL,MOTRIN) 800 MG tablet Take 1 tablet (800 mg total) by mouth 3 (three) times daily.  21 tablet  0  . predniSONE (DELTASONE) 20 MG tablet Take 3 tabs daily for 5 days       No current facility-administered medications for this visit.    Allergies  Allergen Reactions  . Influenza  Vaccines     Broke out in hives and had breathing problems    Past Medical History  Diagnosis Date  . DVT (deep venous thrombosis)     right leg in 2011; prior injury to right foot  . HLD (hyperlipidemia)   . Right foot injury     History reviewed. No pertinent past surgical history.  History  Smoking status  . Former Smoker  . Types: Cigarettes  . Quit date: 07/25/1982  Smokeless tobacco  . Never Used    Comment: REMOTE SOCIAL HISTORY    History  Alcohol Use No    Family History  Problem Relation Age of Onset  . Heart disease Mother   . Heart disease Father   . Vascular Disease Maternal Grandfather     Review of Systems: The review of systems is per the HPI.  All other systems were reviewed and are negative.  Physical Exam: BP 140/70  Pulse 62  Ht 5\' 9"  (1.753 m)  Wt 200 lb 1.9 oz (90.774 kg)  BMI 29.54 kg/m2  SpO2 95% BP by me is 118/70 Patient is very pleasant and in no acute distress. Weigh down 2 pounds. Skin is warm and dry. Color is normal.  HEENT is unremarkable. Normocephalic/atraumatic. PERRL. Sclera are nonicteric. Neck is supple. No masses. No JVD. Lungs are clear. Cardiac exam shows a regular rate and rhythm. Abdomen  is soft. Right lower leg with just trace edema -  Has brawny stasis changes - wearing his support stocking. Gait and ROM are intact. No gross neurologic deficits noted.  Wt Readings from Last 3 Encounters:  09/03/14 200 lb 1.9 oz (90.774 kg)  09/10/13 202 lb (91.627 kg)  08/11/13 188 lb (85.276 kg)    LABORATORY DATA/PROCEDURES:  Lab Results  Component Value Date   WBC 6.9 08/11/2013   HGB 15.0 08/11/2013   HCT 46.1 08/11/2013   PLT 221 08/11/2013   GLUCOSE 98 04/08/2014   CHOL 170 04/08/2014   TRIG 79.0 04/08/2014   HDL 57.40 04/08/2014   LDLCALC 97 04/08/2014   ALT 36 04/08/2014   AST 26 04/08/2014   NA 138 04/08/2014   K 4.2 04/08/2014   CL 104 04/08/2014   CREATININE 1.0 04/08/2014   BUN 20 04/08/2014   CO2 27 04/08/2014    BNP  (last 3 results) No results found for this basename: PROBNP,  in the last 8760 hours   Assessment / Plan: 1. DVT - on just aspirin therapy - doing ok.   2. HLD - Needs fasting labs in November - seeing his PCP later this fall for physical and labs.  3. Syncope/DOE last summer - no recurrence.  See back in a year. No change with current regimen.  Patient is agreeable to this plan and will call if any problems develop in the interim.   Burtis Junes, RN, [place] [address] [place],   [phone]

## 2014-09-04 ENCOUNTER — Ambulatory Visit: Payer: Self-pay | Admitting: Podiatry

## 2014-09-20 ENCOUNTER — Ambulatory Visit: Payer: Medicaid Other | Admitting: Podiatrist

## 2014-09-20 ENCOUNTER — Ambulatory Visit (INDEPENDENT_AMBULATORY_CARE_PROVIDER_SITE_OTHER): Payer: Medicaid Other

## 2014-09-20 VITALS — BP 138/76 | HR 60 | Resp 16

## 2014-09-20 DIAGNOSIS — M2140 Flat foot [pes planus] (acquired), unspecified foot: Secondary | ICD-10-CM

## 2014-09-20 DIAGNOSIS — R52 Pain, unspecified: Secondary | ICD-10-CM

## 2014-09-20 DIAGNOSIS — M7750 Other enthesopathy of unspecified foot: Secondary | ICD-10-CM

## 2014-09-20 DIAGNOSIS — Z79899 Other long term (current) drug therapy: Secondary | ICD-10-CM

## 2014-09-20 IMAGING — CR DG HAND COMPLETE 3+V*L*
3 series · 3 of 3 positions shown · non-contrast
Comparison: None

CLINICAL DATA: Fall.  Pain middle finger

LEFT HAND - COMPLETE 3+ VIEW

[x hand pa left]
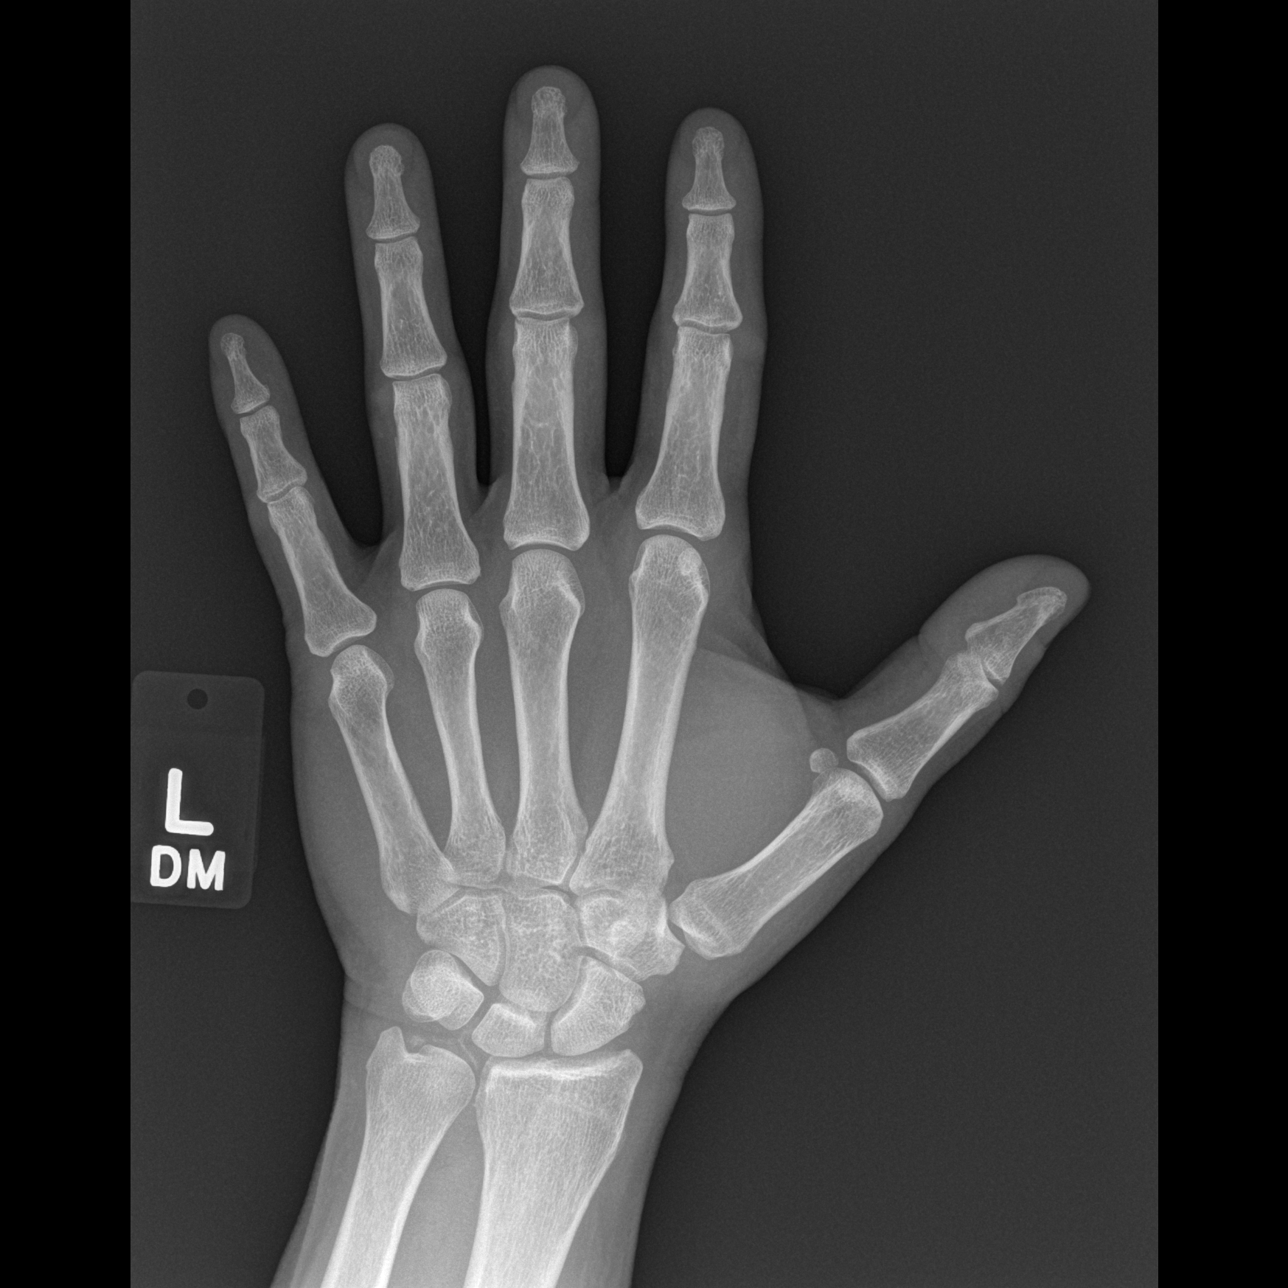

[x hand oblique left]
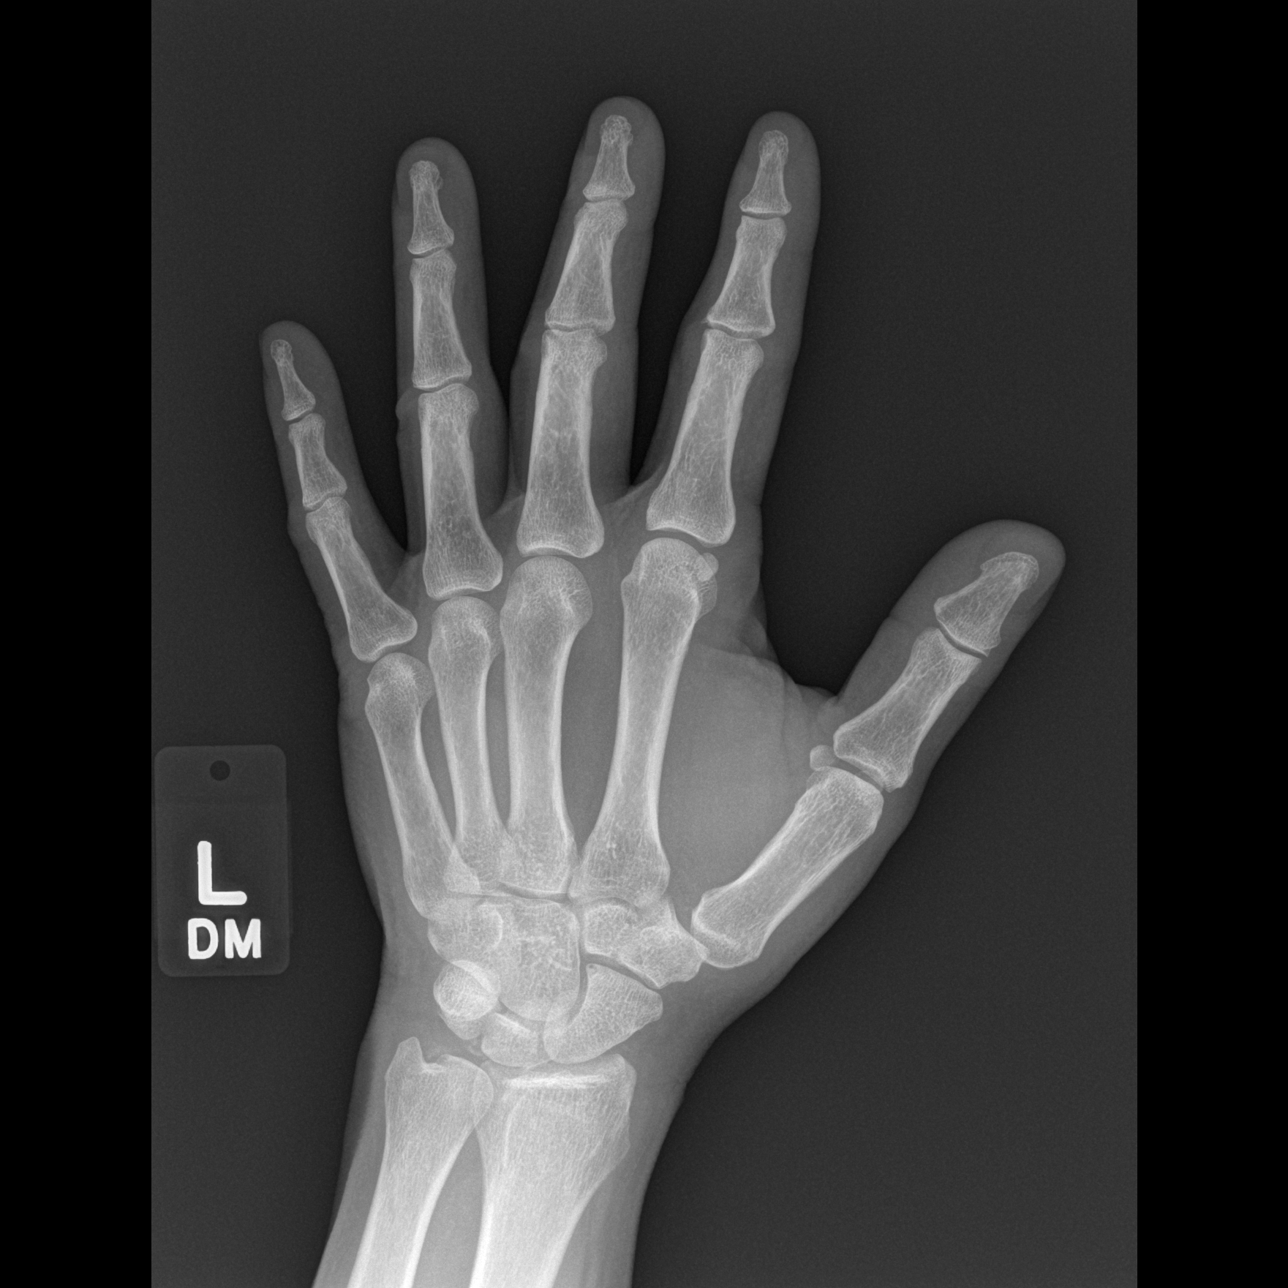

[x hand lat left]
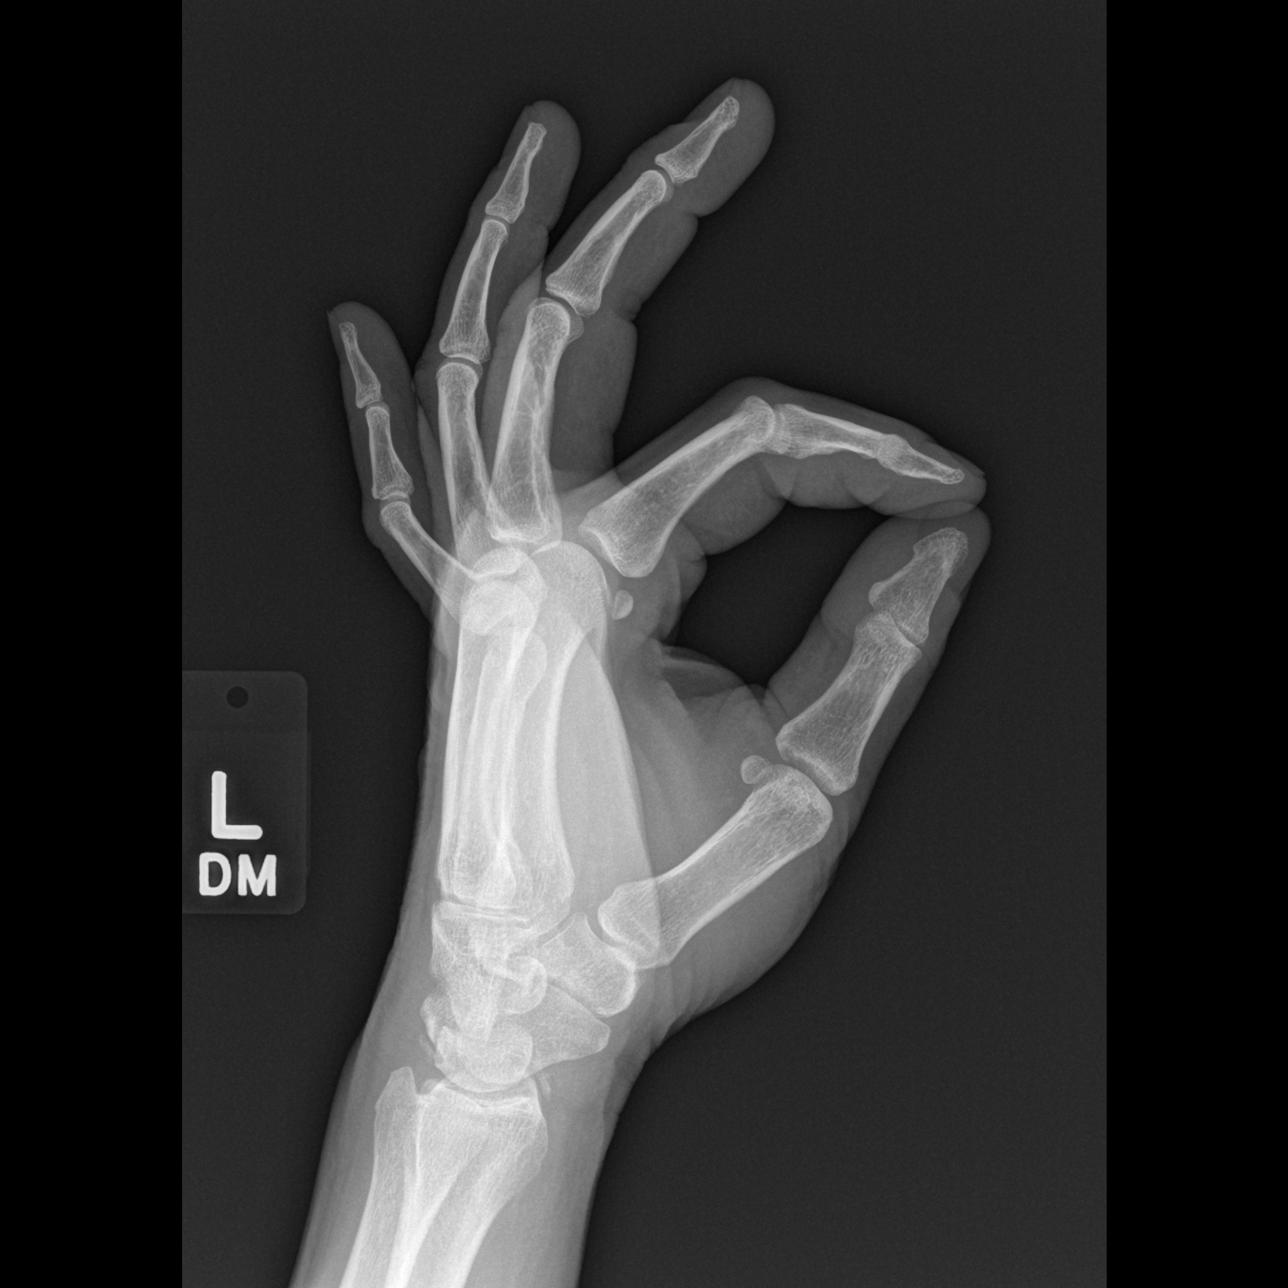

[3 of 3 positions shown; findings below may reference images not displayed]

FINDINGS: Normal alignment and no fracture.

Chondrocalcinosis in the triangular fibrocartilage.  No significant
joint space narrowing of the wrist.
IMPRESSION: Negative for fracture.

## 2014-09-20 IMAGING — CR DG CERVICAL SPINE COMPLETE 4+V
6 series · 6 of 6 positions shown · non-contrast
Comparison: None.

CLINICAL DATA: Fall

CERVICAL SPINE - COMPLETE 4+ VIEW

[w c-spine lat]
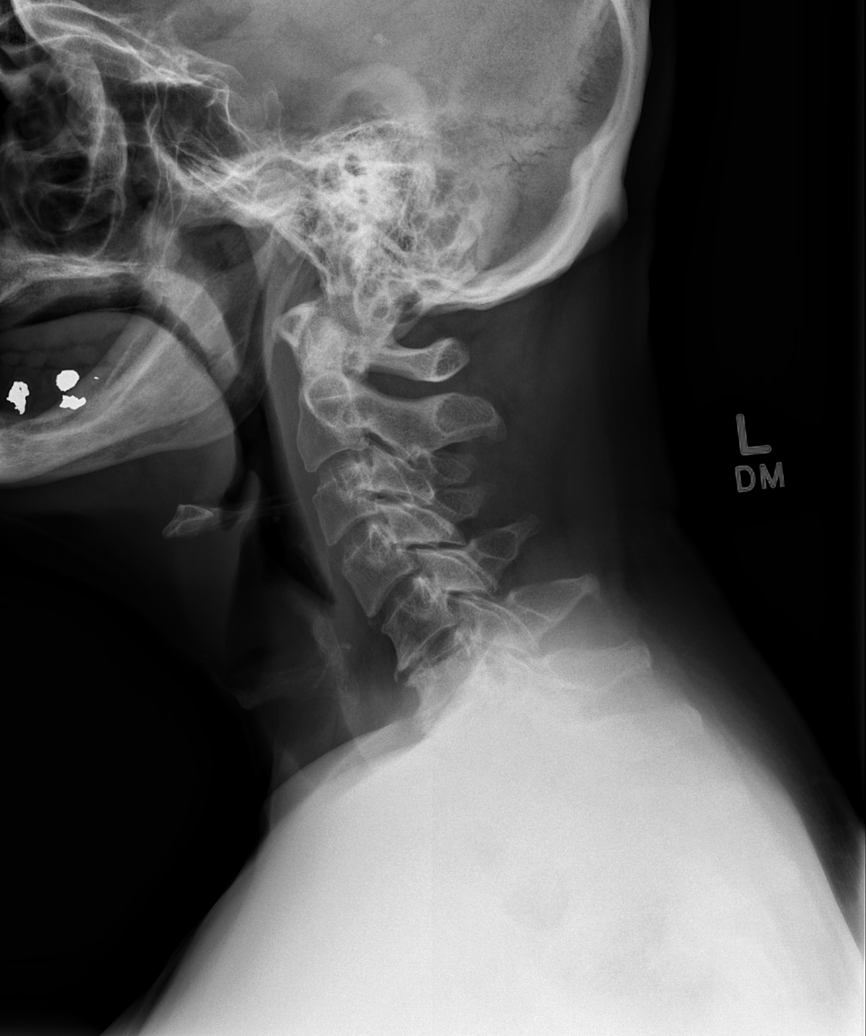

[w c-spine oblique (1 of 2)]
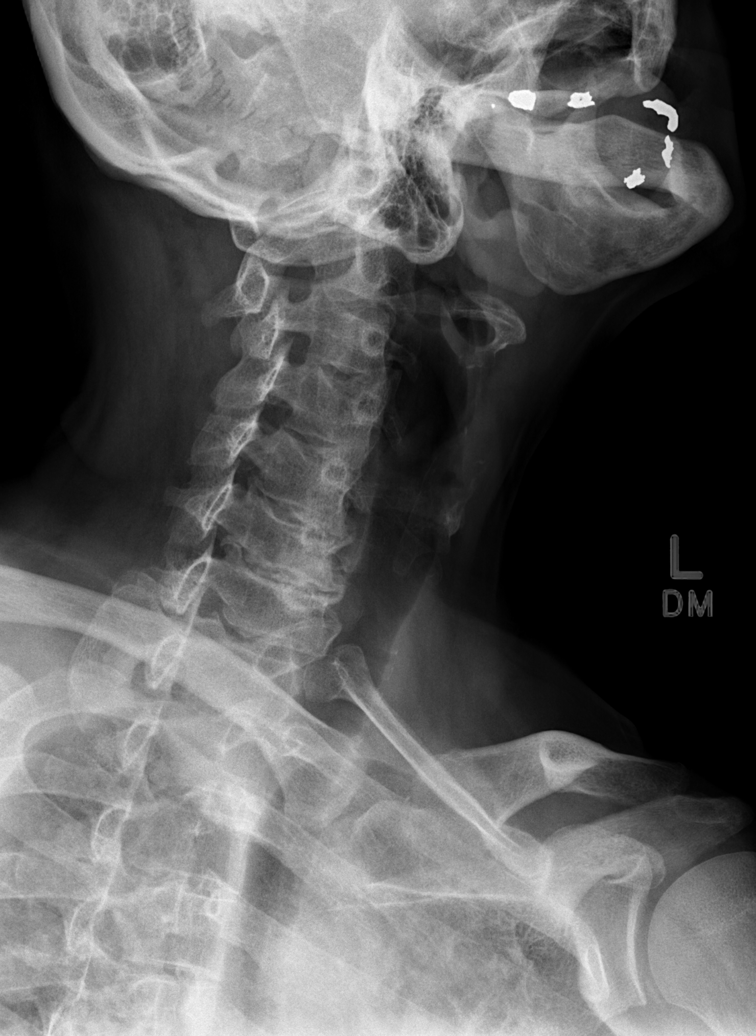

[w c-spine oblique (2 of 2)]
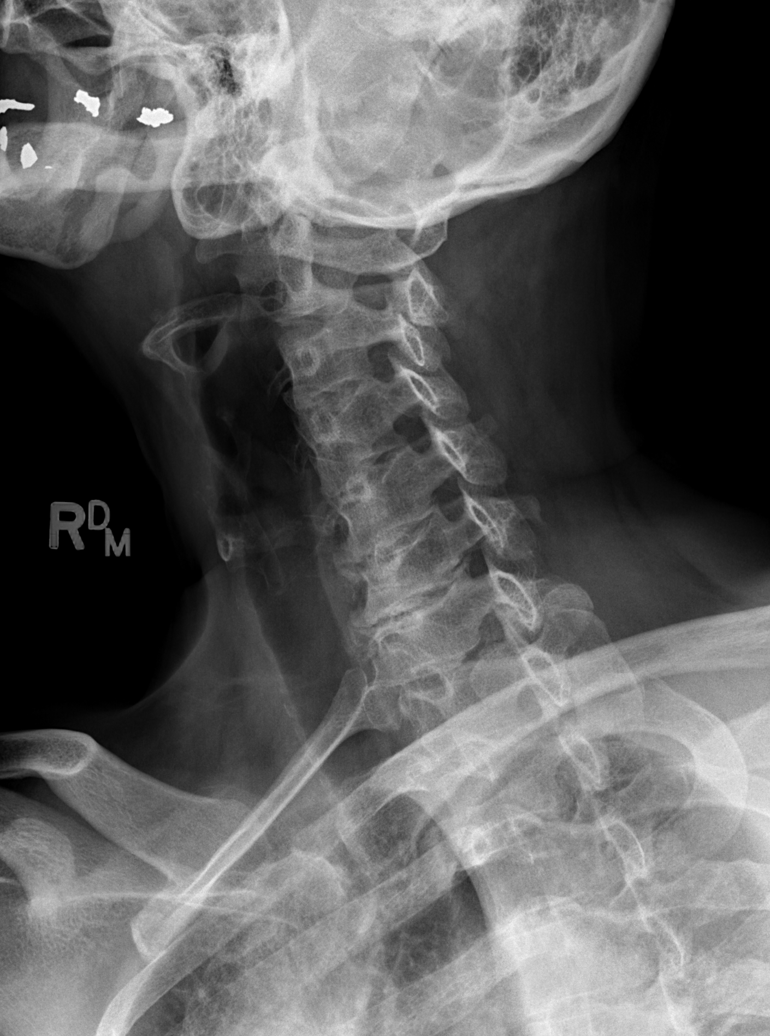

[w c-spine a.p.]
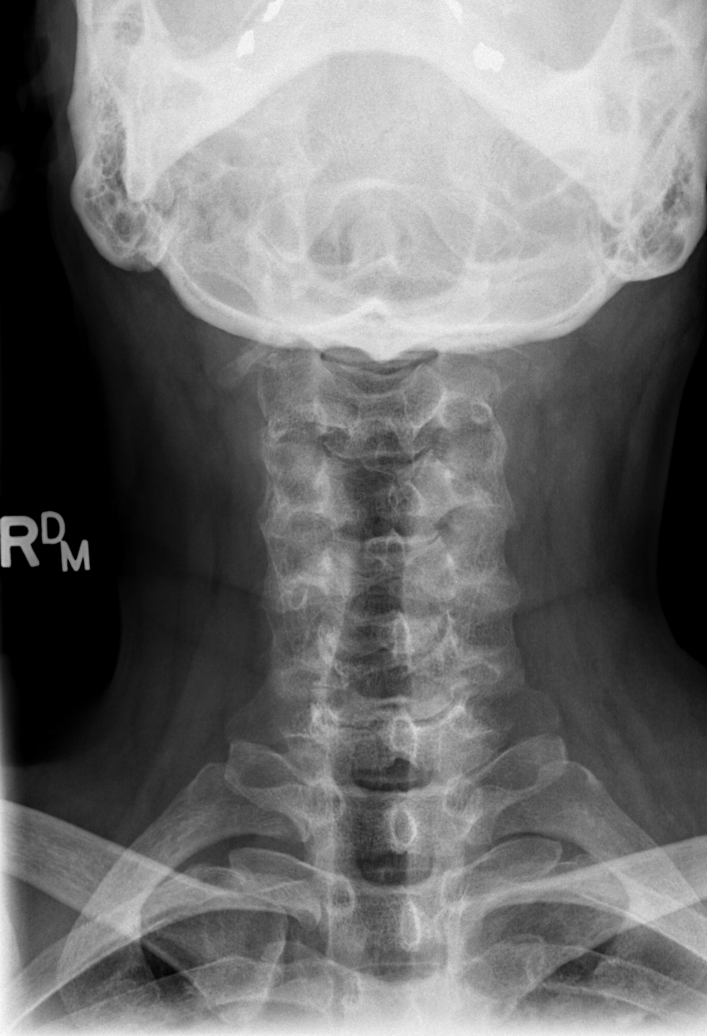

[w c-spine odontoid]
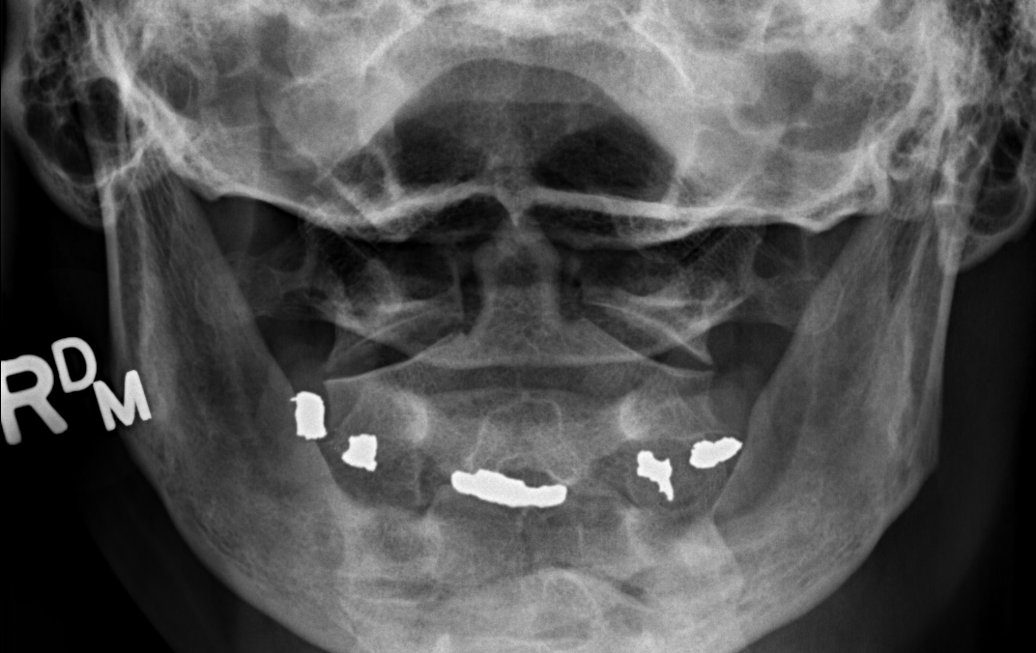

[w swimmers view]
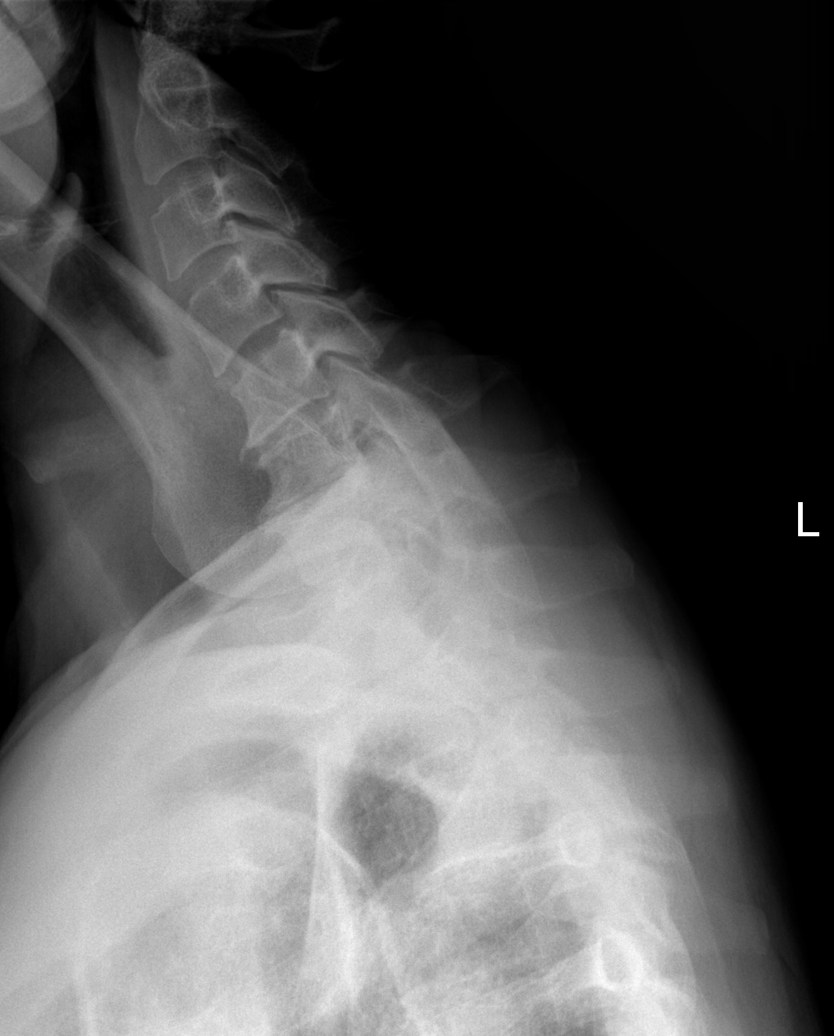

[6 of 6 positions shown; findings below may reference images not displayed]

FINDINGS: Normal alignment and no fracture.  Disc degeneration and
spondylosis C5-6 and C6-7 causing foraminal encroachment.  Negative
for fracture.
IMPRESSION: Negative for fracture.

## 2014-09-20 MED ORDER — TERBINAFINE HCL 250 MG PO TABS: 250.0000 mg | ORAL_TABLET | Freq: Every day | ORAL | Status: DC

## 2014-09-20 MED ORDER — PREDNISONE 10 MG PO KIT: PACK | ORAL | Status: DC

## 2014-09-20 NOTE — Patient Instructions (Signed)
Posterior Tibial Tendon Tendinitis with Rehab Tendonitis is a condition that is characterized by inflammation of a tendon or the lining (sheath) that surrounds it. The inflammation is usually caused by damage to the tendon, such as a tendon tear (strain). Sprains are classified into three categories. Grade 1 sprains cause pain, but the tendon is not lengthened. Grade 2 sprains include a lengthened ligament due to the ligament being stretched or partially ruptured. With grade 2 sprains there is still function, although the function may be diminished. Grade 3 sprains are characterized by a complete tear of the tendon or muscle, and function is usually impaired. Posterior tibialis tendonitis is tendonitis of the posterior tibial tendon, which attaches muscles of the lower leg to the foot. The posterior tibial tendon is located in the back of the ankle and helps the body straighten (plantar flex) and rotate inward (medially rotate) the ankle. SYMPTOMS   Pain, tenderness, swelling, warmth, and/or redness over the back of the inner ankle at the posterior tibial tendon or the inner part of the mid-foot.  Pain that worsens with plantar flexion or medial rotation of the ankle.  A crackling sound (crepitation) when the tendon is moved or touched. CAUSES  Posterior tibial tendonitis occurs when damage to the posterior tibial tendon starts an inflammatory response. Common mechanisms of injury include:  Degenerative (occurs with aging) processes that weaken the tendon and make it more susceptible to injury.  Stress placed on the tendon from an increase in the intensity, frequency, or duration of training.  Direct trauma to the ankle.  Returning to activity before a previous ankle injury is allowed to heal. RISK INCREASES WITH:  Activities that involve repetitive and/or stressful plantar flexion (jumping, kicking, or running up/down hills).  Poor strength and flexibility.  Flat feet.  Previous injury  to the foot, ankle, or leg. PREVENTION   Warm up and stretch properly before activity.  Allow for adequate recovery between workouts.  Maintain physical fitness:  Strength, flexibility, and endurance.  Cardiovascular fitness.  Learn and use proper technique. When possible, have a coach correct improper technique.  Complete rehabilitation from a previous foot, ankle, or leg injury.  If you have flat feet, wear arch supports (orthotics). PROGNOSIS  If treated properly, the symptoms of tendonitis usually resolve within 6 weeks. This period may be shorter for injuries caused by direct trauma. RELATED COMPLICATIONS   Prolonged healing time, if improperly treated or reinjured.  Recurrent symptoms that result in a chronic problem.  Partial or complete tendon tear (rupture) requiring surgery. TREATMENT  Treatment initially involves the use of ice and medication to help reduce pain and inflammation. The use of strengthening and stretching exercises may help reduce pain with activity. These exercises may be performed at home or with referral to a therapist. Often times, your caregiver will recommend immobilizing the ankle to allow the tendon to heal. If you have flat feet, you may be advised to wear orthotic arch supports. If symptoms persist for greater than 6 months despite nonsurgical (conservative) treatment, then surgery may be recommended. MEDICATION   If pain medication is necessary, then nonsteroidal anti-inflammatory medications, such as aspirin and ibuprofen, or other minor pain relievers, such as acetaminophen, are often recommended.  Do not take pain medication for 7 days before surgery.  Prescription pain relievers may be given if deemed necessary by your caregiver. Use only as directed and only as much as you need.  Corticosteroid injections may be given by your caregiver. These injections should  be reserved for the most serious cases because they may only be given a certain  number of times. HEAT AND COLD  Cold treatment (icing) relieves pain and reduces inflammation. Cold treatment should be applied for 10 to 15 minutes every 2 to 3 hours for inflammation and pain and immediately after any activity that aggravates your symptoms. Use ice packs or massage the area with a piece of ice (ice massage).  Heat treatment may be used prior to performing the stretching and strengthening activities prescribed by your caregiver, physical therapist, or athletic trainer. Use a heat pack or soak the injury in warm water. SEEK MEDICAL CARE IF:  Treatment seems to offer no benefit, or the condition worsens.  Any medications produce adverse side effects. EXERCISES RANGE OF MOTION (ROM) AND STRETCHING EXERCISES - Posterior Tibial Tendon Tendinitis These exercises may help you when beginning to rehabilitate your injury. Your symptoms may resolve with or without further involvement from your physician, physical therapist or athletic trainer. While completing these exercises, remember:   Restoring tissue flexibility helps normal motion to return to the joints. This allows healthier, less painful movement and activity.  An effective stretch should be held for at least 30 seconds.  A stretch should never be painful. You should only feel a gentle lengthening or release in the stretched tissue. RANGE OF MOTION - Ankle Plantar Flexion   Sit with your right / left leg crossed over your opposite knee.  Use your opposite hand to pull the top of your foot and toes toward you.  You should feel a gentle stretch on the top of your foot/ankle. Hold this position for __________ seconds. Repeat __________ times. Complete this exercise __________ times per day.  RANGE OF MOTION - Ankle Eversion   Sit with your right / left ankle crossed over your opposite knee.  Grip your foot with your opposite hand, placing your thumb on the top of your foot and your fingers across the bottom of your  foot.  Gently push your foot downward with a slight rotation so your littlest toes rise slightly.  You should feel a gentle stretch on the inside of your ankle. Hold the stretch for __________ seconds. Repeat __________ times. Complete this exercise __________ times per day.  RANGE OF MOTION - Ankle Inversion   Sit with your right / left ankle crossed over your opposite knee.  Grip your foot with your opposite hand, placing your thumb on the bottom of your foot and your fingers across the top of your foot.  Gently pull your foot so the smallest toe comes toward you and your thumb pushes the inside of the ball of your foot away from you.  You should feel a gentle stretch on the outside of your ankle. Hold the stretch for __________ seconds. Repeat __________ times. Complete this exercise __________ times per day.  RANGE OF MOTION - Dorsi/Plantar Flexion  While sitting with your right / left knee straight, draw the top of your foot upward by flexing your ankle. Then reverse the motion, pointing your toes downward.  Hold each position for __________ seconds.  After completing your first set of exercises, repeat this exercise with your knee bent. Repeat __________ times. Complete this exercise __________ times per day.  RANGE OF MOTION - Ankle Alphabet  Imagine your right / left big toe is a pen.  Keeping your hip and knee still, write out the entire alphabet with your "pen." Make the letters as large as you can without   increasing any discomfort. Repeat __________ times. Complete this exercise __________ times per day.  STRETCH - Gastrocsoleus   Sit with your right / left leg extended. Holding onto both ends of a belt or towel, loop it around the ball of your foot.  Keeping your right / left ankle and foot relaxed and your knee straight, pull your foot and ankle toward you using the belt/towel.  You should feel a gentle stretch behind your calf or knee. Hold this position for  __________ seconds. Repeat __________ times. Complete this exercise __________ times per day.  STRETCH - Gastroc, Standing   Place hands on wall.  Extend right / left leg, keeping the front knee somewhat bent.  Slightly point your toes inward on your back foot.  Keeping your right / left heel on the floor and your knee straight, shift your weight toward the wall, not allowing your back to arch.  You should feel a gentle stretch in the right / left calf. Hold this position for __________ seconds. Repeat __________ times. Complete this stretch __________ times per day. STRETCH - Soleus, Standing   Place hands on wall.  Extend right / left leg, keeping the other knee somewhat bent.  Slightly point your toes inward on your back foot.  Keep your right / left heel on the floor, bend your back knee, and slightly shift your weight over the back leg so that you feel a gentle stretch deep in your back calf.  Hold this position for __________ seconds. Repeat __________ times. Complete this stretch __________ times per day. STRENGTHENING EXERCISES - Posterior Tibial Tendon Tendinitis These exercises may help you when beginning to rehabilitate your injury. They may resolve your symptoms with or without further involvement from your physician, physical therapist, or athletic trainer. While completing these exercises, remember:   Muscles can gain both the endurance and the strength needed for everyday activities through controlled exercises.  Complete these exercises as instructed by your physician, physical therapist, or athletic trainer. Progress the resistance and repetitions only as guided. STRENGTH - Dorsiflexors  Secure a rubber exercise band/tubing to a fixed object (i.e., table, pole) and loop the other end around your right / left foot.  Sit on the floor facing the fixed object. The band/tubing should be slightly tense when your foot is relaxed.  Slowly draw your foot back toward you  using your ankle and toes.  Hold this position for __________ seconds. Slowly release the tension in the band and return your foot to the starting position. Repeat __________ times. Complete this exercise __________ times per day.  STRENGTH - Towel Curls  Sit in a chair positioned on a non-carpeted surface.  Place your foot on a towel, keeping your heel on the floor.  Pull the towel toward your heel by only curling your toes. Keep your heel on the floor.  If instructed by your physician, physical therapist, or athletic trainer, add ____________________ at the end of the towel. Repeat __________ times. Complete this exercise __________ times per day. STRENGTH - Ankle Eversion   Secure one end of a rubber exercise band/tubing to a fixed object (table, pole). Loop the other end around your foot just before your toes.  Place your fists between your knees. This will focus your strengthening at your ankle.  Drawing the band/tubing across your opposite foot, slowly pull your little toe out and up. Make sure the band/tubing is positioned to resist the entire motion.  Hold this position for __________ seconds.  Have   your muscles resist the band/tubing as it slowly pulls your foot back to the starting position. Repeat __________ times. Complete this exercise __________ times per day.  STRENGTH - Ankle Inversion   Secure one end of a rubber exercise band/tubing to a fixed object (table, pole). Loop the other end around your foot just before your toes.  Place your fists between your knees. This will focus your strengthening at your ankle.  Slowly, pull your big toe up and in, making sure the band/tubing is positioned to resist the entire motion.  Hold this position for __________ seconds.  Have your muscles resist the band/tubing as it slowly pulls your foot back to the starting position. Repeat __________ times. Complete this exercises __________ times per day.  Document Released:  11/29/2005 Document Revised: 04/15/2014 Document Reviewed: 03/13/2009 [employer] Patient Information 2015 [employer]. This information is not intended to replace advice given to you by your health care provider. Make sure you discuss any questions you have with your health care provider.  

## 2014-09-20 NOTE — Progress Notes (Signed)
   Subjective:    Patient ID: Andrew [name], male    DOB: [birth year], 63 y.o.   MRN: [id number]  HPI Pt presents with right foot pain, pain is located on the top of his foot, worsens with walking and pain is intermittent. Pt has h/o of right left DVT   Review of Systems  All other systems reviewed and are negative.      Objective:   Physical Exam Patient is awake, alert, and oriented x 3.  In no acute distress.  Vascular status is intact with palpable pedal pulses at 2/4 DP and PT bilateral and capillary refill time within normal limits. Neurological sensation is also intact bilaterally via Semmes Weinstein monofilament at 5/5 sites. Light touch, vibratory sensation, Achilles tendon reflex is intact. Dermatological exam reveals skin color, turger and texture as normal. Right second toenails appears fungal with brownish discoloration present.   Musculature intact with dorsiflexion, plantarflexion, inversion, eversion. Pain at the insertion of the posterior tibial tendon is present right foot.  Pes planus deformity is also seen bilateral    Assessment & Plan:  Pes planus, posterior tibial tendonitis right, onychomycosis right 2nd toenail  Plan:  Injected at the area of maximal tenderness on the right foot with dexamethasone and marcaine plain.  Started him on a steroid dose pack to be followe by mobic.  Recommended powerstep inserts which were dispensed.  Also discussed lamisil for the right 2nd toenail.  Pre-liverfunction panel was recommended and rx was written.  Will recheck again in 1 month for tendonitis and for lamisil.

## 2014-09-21 LAB — HEPATIC FUNCTION PANEL
ALK PHOS: 40 U/L (ref 39–117)
ALT: 24 U/L (ref 0–53)
AST: 19 U/L (ref 0–37)
Albumin: 4 g/dL (ref 3.5–5.2)
BILIRUBIN DIRECT: 0.2 mg/dL (ref 0.0–0.3)
BILIRUBIN INDIRECT: 0.5 mg/dL (ref 0.2–1.2)
BILIRUBIN TOTAL: 0.7 mg/dL (ref 0.2–1.2)
Total Protein: 6.6 g/dL (ref 6.0–8.3)

## 2014-10-01 ENCOUNTER — Telehealth: Payer: Self-pay | Admitting: *Deleted

## 2014-10-01 NOTE — Telephone Encounter (Signed)
I left her a message that her labs look great per Dr. Valentina Lucks.  Call if you have any questions.

## 2014-10-01 NOTE — Telephone Encounter (Signed)
Message copied by Lolita Rieger on Tue Oct 01, 2014  2:16 PM ------      Message from: Bronson Ing      Created: Wed Sep 25, 2014 10:31 PM       I feel like I've already told you this,  But.. labs look great.        ----- Message -----         From: [facility]         Sent: 09/21/2014   1:06 AM           To: Bronson Ing, DPM                   ------

## 2014-10-18 ENCOUNTER — Ambulatory Visit: Payer: Medicaid Other | Admitting: Podiatrist

## 2014-11-28 DIAGNOSIS — I872 Venous insufficiency (chronic) (peripheral): Secondary | ICD-10-CM | POA: Insufficient documentation

## 2014-12-05 DIAGNOSIS — K4091 Unilateral inguinal hernia, without obstruction or gangrene, recurrent: Secondary | ICD-10-CM | POA: Insufficient documentation

## 2014-12-12 ENCOUNTER — Emergency Department (HOSPITAL_COMMUNITY): Payer: Medicaid Other

## 2014-12-12 ENCOUNTER — Emergency Department (HOSPITAL_COMMUNITY)
Admission: EM | Admit: 2014-12-12 | Discharge: 2014-12-12 | Disposition: A | Discharge status: Home / Self Care | Payer: Medicaid Other | Attending: Emergency Medicine | Admitting: Emergency Medicine

## 2014-12-12 ENCOUNTER — Encounter (HOSPITAL_COMMUNITY): Payer: Self-pay | Admitting: *Deleted

## 2014-12-12 DIAGNOSIS — Z79899 Other long term (current) drug therapy: Secondary | ICD-10-CM | POA: Diagnosis not present

## 2014-12-12 DIAGNOSIS — Y9289 Other specified places as the place of occurrence of the external cause: Secondary | ICD-10-CM | POA: Diagnosis not present

## 2014-12-12 DIAGNOSIS — Z791 Long term (current) use of non-steroidal anti-inflammatories (NSAID): Secondary | ICD-10-CM | POA: Diagnosis not present

## 2014-12-12 DIAGNOSIS — Z87891 Personal history of nicotine dependence: Secondary | ICD-10-CM | POA: Diagnosis not present

## 2014-12-12 DIAGNOSIS — Z87828 Personal history of other (healed) physical injury and trauma: Secondary | ICD-10-CM | POA: Diagnosis not present

## 2014-12-12 DIAGNOSIS — S70361A Insect bite (nonvenomous), right thigh, initial encounter: Secondary | ICD-10-CM | POA: Insufficient documentation

## 2014-12-12 DIAGNOSIS — E785 Hyperlipidemia, unspecified: Secondary | ICD-10-CM | POA: Insufficient documentation

## 2014-12-12 DIAGNOSIS — J069 Acute upper respiratory infection, unspecified: Secondary | ICD-10-CM | POA: Diagnosis not present

## 2014-12-12 DIAGNOSIS — Z7982 Long term (current) use of aspirin: Secondary | ICD-10-CM | POA: Diagnosis not present

## 2014-12-12 DIAGNOSIS — W57XXXA Bitten or stung by nonvenomous insect and other nonvenomous arthropods, initial encounter: Secondary | ICD-10-CM | POA: Diagnosis not present

## 2014-12-12 DIAGNOSIS — Z86718 Personal history of other venous thrombosis and embolism: Secondary | ICD-10-CM | POA: Diagnosis not present

## 2014-12-12 DIAGNOSIS — Y9389 Activity, other specified: Secondary | ICD-10-CM | POA: Insufficient documentation

## 2014-12-12 DIAGNOSIS — R05 Cough: Secondary | ICD-10-CM

## 2014-12-12 DIAGNOSIS — Y998 Other external cause status: Secondary | ICD-10-CM | POA: Insufficient documentation

## 2014-12-12 DIAGNOSIS — R059 Cough, unspecified: Secondary | ICD-10-CM

## 2014-12-12 MED ORDER — ALBUTEROL SULFATE HFA 108 (90 BASE) MCG/ACT IN AERS
2.0000 | INHALATION_SPRAY | Freq: Once | RESPIRATORY_TRACT | Status: AC
  Administered 2014-12-12: 2 via RESPIRATORY_TRACT
  Filled 2014-12-12: qty 6.7

## 2014-12-12 MED ORDER — DOXYCYCLINE HYCLATE 100 MG PO TABS
100.0000 mg | ORAL_TABLET | Freq: Once | ORAL | Status: AC
  Administered 2014-12-12: 100 mg via ORAL
  Filled 2014-12-12: qty 1

## 2014-12-12 MED ORDER — ALBUTEROL SULFATE HFA 108 (90 BASE) MCG/ACT IN AERS: 2.0000 | INHALATION_SPRAY | Freq: Four times a day (QID) | RESPIRATORY_TRACT | Status: DC | PRN

## 2014-12-12 MED ORDER — DOXYCYCLINE HYCLATE 100 MG PO TABS: 100.0000 mg | ORAL_TABLET | Freq: Two times a day (BID) | ORAL | Status: DC

## 2014-12-12 NOTE — ED Notes (Signed)
Discharge instructions reviewed with pt and pt's family, no further questions.

## 2014-12-12 NOTE — ED Notes (Signed)
MD at bedside. 

## 2014-12-12 NOTE — ED Provider Notes (Signed)
CSN: [id number]     Arrival date & time 12/12/14  1652 History   First MD Initiated Contact with Patient 12/12/14 1843     Chief Complaint  Patient presents with  . Cough  . Fever     (Consider location/radiation/quality/duration/timing/severity/associated sxs/prior Treatment) Patient is a 63 y.o. male presenting with URI and animal bite. The history is provided by the patient and a relative.  URI Presenting symptoms: congestion, cough and rhinorrhea   Presenting symptoms: no fever (t max of 99 degrees at home) and no sore throat   Severity:  Moderate Onset quality:  Gradual Duration:  5 days Timing:  Constant Progression:  Unchanged Chronicity:  New Relieved by:  None tried Worsened by:  Nothing tried Ineffective treatments:  None tried Associated symptoms: no headaches, no myalgias, no neck pain and no wheezing   Animal Bite Attacking animal: tick. Location:  Leg Leg injury location:  R upper leg Time since incident: unknown, removed 2 hours prior to arrival. Pain details:    Quality:  Aching   Severity:  Mild   Timing:  Constant   Progression:  Worsening Incident location:  Outside (patient is "outdoorsy" and goes into woods alot) Associated symptoms: no fever (t max of 99 degrees at home) and no rash     Past Medical History  Diagnosis Date  . DVT (deep venous thrombosis)     right leg in 2011; prior injury to right foot  . HLD (hyperlipidemia)   . Right foot injury    History reviewed. No pertinent past surgical history. Family History  Problem Relation Age of Onset  . Heart disease Mother   . Heart disease Father   . Vascular Disease Maternal Grandfather    History  Substance Use Topics  . Smoking status: Former Smoker    Types: Cigarettes    Quit date: 07/25/1982  . Smokeless tobacco: Never Used     Comment: REMOTE SOCIAL HISTORY  . Alcohol Use: No    Review of Systems  Constitutional: Negative for fever (t max of 99 degrees at home),  diaphoresis, activity change and appetite change.  HENT: Positive for congestion and rhinorrhea. Negative for facial swelling, sore throat, tinnitus, trouble swallowing and voice change.   Eyes: Negative for pain, redness and visual disturbance.  Respiratory: Positive for cough. Negative for chest tightness, shortness of breath and wheezing.   Cardiovascular: Negative for chest pain, palpitations and leg swelling.  Gastrointestinal: Negative for nausea, vomiting, abdominal pain, diarrhea, constipation and abdominal distention.  Endocrine: Negative.   Genitourinary: Negative.  Negative for dysuria, decreased urine volume, scrotal swelling and testicular pain.  Musculoskeletal: Negative for myalgias, back pain, gait problem and neck pain.  Skin: Positive for wound (right upper thigh). Negative for rash.  Neurological: Negative.  Negative for dizziness, tremors, weakness and headaches.  Psychiatric/Behavioral: Negative for suicidal ideas, hallucinations and self-injury. The patient is not nervous/anxious.       Allergies  Influenza vaccines  Home Medications   Prior to Admission medications   Medication Sig Start Date End Date Taking? Authorizing Provider  aspirin 81 MG tablet Take 81 mg by mouth daily.   Yes Historical Provider, MD  atorvastatin (LIPITOR) 40 MG tablet TAKE ONE TABLET BY MOUTH ONE TIME DAILY 09/03/14  Yes Burtis Junes, NP  ibuprofen (ADVIL,MOTRIN) 800 MG tablet Take 1 tablet (800 mg total) by mouth 3 (three) times daily. 08/11/13  Yes Merryl Hacker, MD  albuterol (PROVENTIL HFA;VENTOLIN HFA) 108 (90 BASE) MCG/ACT  inhaler Inhale 2 puffs into the lungs every 6 (six) hours as needed for wheezing or shortness of breath. 12/12/14   Margaretann Loveless, MD  doxycycline (VIBRA-TABS) 100 MG tablet Take 1 tablet (100 mg total) by mouth 2 (two) times daily. 12/12/14   Margaretann Loveless, MD  PredniSONE 10 MG KIT 6 day tapering dose Patient not taking: Reported on 12/12/2014 09/20/14   Bronson Ing, DPM  terbinafine (LAMISIL) 250 MG tablet Take 1 tablet (250 mg total) by mouth daily. Patient not taking: Reported on 12/12/2014 09/20/14   Bronson Ing, DPM   BP 128/77 mmHg  Pulse 56  Temp(Src) 97.5 F (36.4 C) (Oral)  Resp 12  SpO2 100% Physical Exam  Constitutional: He is oriented to person, place, and time. He appears well-developed and well-nourished. No distress.  HENT:  Head: Normocephalic and atraumatic.  Right Ear: External ear normal.  Left Ear: External ear normal.  Nose: Rhinorrhea (clear) present.  Mouth/Throat: Oropharynx is clear and moist.  Eyes: Conjunctivae and EOM are normal. Pupils are equal, round, and reactive to light. No scleral icterus.  Neck: Normal range of motion. Neck supple. No JVD present. No tracheal deviation present. No thyromegaly present.  Cardiovascular: Normal rate and intact distal pulses.  Exam reveals no gallop and no friction rub.   No murmur heard. Pulmonary/Chest: Effort normal. No stridor. No respiratory distress. He has wheezes (mild expiratory wheezing in bilateral apical lung fields). He has no rales.  Abdominal: Soft. He exhibits no distension. There is no tenderness. There is no rebound and no guarding.  Musculoskeletal: Normal range of motion. He exhibits no edema or tenderness.  Neurological: He is alert and oriented to person, place, and time. No cranial nerve deficit. He exhibits normal muscle tone. Coordination normal.  5/5 strength in all 4 extremities. Normal Gait.   Skin: Skin is warm and dry. He is not diaphoretic.  2 cm round area of erythema on upper right anterior thigh where patient states tick was removed. No tick head or jaws still present on close inspection.   Psychiatric: He has a normal mood and affect. His behavior is normal.  Nursing note and vitals reviewed.   ED Course  Procedures (including critical care time) Labs Review Labs Reviewed - No data to display  Imaging Review Dg Chest 2  View  12/12/2014   CLINICAL DATA:  Cough and fever. Sore throat for 4 days. Initial encounter.  EXAM: CHEST  2 VIEW  COMPARISON:  None.  FINDINGS: The heart size and mediastinal contours are within normal limits. Both lungs are clear. The visualized skeletal structures are unremarkable.  IMPRESSION: No active cardiopulmonary disease.   Electronically Signed   By: Misty Stanley M.D.   On: 12/12/2014 18:29     EKG Interpretation None      MDM   Final diagnoses:  URI, acute  Tick bite    The patient is a 63 y.o. M who presents for 5 days of viral URI symptoms and a tick bite to her right upper thigh. Tick was discovered two hours prior to presentation and immediately removed by family member prior to coming to ED. Tick is light brown and engorged (family brings tick in ziplock back). Will empirically treat with doxycyline for possible RMSF and also give albuterol inhaler. Patient expresses understanding and agreement with this plan and is discharged home with PCP follow up and standard ED return precaution.  Patient seen with attending, Dr. Johnney Killian, who oversaw clinical decision making.  Margaretann Loveless, MD 12/12/14 1956  Charlesetta Shanks, MD 12/13/14 [phone]

## 2014-12-12 NOTE — ED Notes (Signed)
Pt and family reports pt having cough and cold symptoms for several days, cough is productive with white sputum and now fever. Airway intact at triage. Pt also had tick bite that was embedded right upper thigh, they wanted that checked as well. No acute distress noted at this time.

## 2015-10-06 ENCOUNTER — Other Ambulatory Visit: Payer: Self-pay | Admitting: Nurse Practitioner

## 2015-10-07 ENCOUNTER — Other Ambulatory Visit: Payer: Self-pay

## 2015-10-07 MED ORDER — ATORVASTATIN CALCIUM 40 MG PO TABS: ORAL_TABLET | ORAL | Status: DC

## 2015-10-17 ENCOUNTER — Other Ambulatory Visit: Payer: Self-pay | Admitting: Nurse Practitioner

## 2015-11-04 ENCOUNTER — Other Ambulatory Visit: Payer: Self-pay | Admitting: Nurse Practitioner

## 2016-03-26 ENCOUNTER — Telehealth: Payer: Self-pay | Admitting: *Deleted

## 2016-03-26 NOTE — Telephone Encounter (Signed)
lvm to let pt know appointment time and date were rescheduled due to providers vacation.

## 2016-04-09 ENCOUNTER — Ambulatory Visit: Payer: Medicaid Other | Admitting: Nurse Practitioner

## 2016-04-20 ENCOUNTER — Ambulatory Visit: Payer: Medicaid Other | Admitting: Nurse Practitioner

## 2016-04-28 ENCOUNTER — Ambulatory Visit (INDEPENDENT_AMBULATORY_CARE_PROVIDER_SITE_OTHER): Payer: Medicaid Other | Admitting: Nurse Practitioner

## 2016-04-28 ENCOUNTER — Encounter: Payer: Self-pay | Admitting: Nurse Practitioner

## 2016-04-28 VITALS — BP 128/78 | HR 66 | Ht 70.0 in | Wt 219.2 lb

## 2016-04-28 DIAGNOSIS — E785 Hyperlipidemia, unspecified: Secondary | ICD-10-CM

## 2016-04-28 DIAGNOSIS — I82409 Acute embolism and thrombosis of unspecified deep veins of unspecified lower extremity: Secondary | ICD-10-CM

## 2016-04-28 LAB — CBC
HCT: 43.6 % (ref 38.5–50.0)
Hemoglobin: 14 g/dL (ref 13.2–17.1)
MCH: 29.8 pg (ref 27.0–33.0)
MCHC: 32.1 g/dL (ref 32.0–36.0)
MCV: 92.8 fL (ref 80.0–100.0)
MPV: 8.4 fL (ref 7.5–12.5)
Platelets: 220 10*3/uL (ref 140–400)
RBC: 4.7 MIL/uL (ref 4.20–5.80)
RDW: 14.1 % (ref 11.0–15.0)
WBC: 8.1 10*3/uL (ref 3.8–10.8)

## 2016-04-28 LAB — HEPATIC FUNCTION PANEL
ALT: 25 U/L (ref 9–46)
AST: 18 U/L (ref 10–35)
Albumin: 3.7 g/dL (ref 3.6–5.1)
Alkaline Phosphatase: 42 U/L (ref 40–115)
Bilirubin, Direct: 0.1 mg/dL (ref <=0.2)
Indirect Bilirubin: 0.6 mg/dL (ref 0.2–1.2)
Total Bilirubin: 0.7 mg/dL (ref 0.2–1.2)
Total Protein: 6.4 g/dL (ref 6.1–8.1)

## 2016-04-28 LAB — BASIC METABOLIC PANEL
BUN: 14 mg/dL (ref 7–25)
CO2: 25 mmol/L (ref 20–31)
Calcium: 8.9 mg/dL (ref 8.6–10.3)
Chloride: 106 mmol/L (ref 98–110)
Creat: 1.07 mg/dL (ref 0.70–1.25)
Glucose, Bld: 103 mg/dL — ABNORMAL HIGH (ref 65–99)
Potassium: 3.7 mmol/L (ref 3.5–5.3)
Sodium: 141 mmol/L (ref 135–146)

## 2016-04-28 MED ORDER — ATORVASTATIN CALCIUM 40 MG PO TABS: ORAL_TABLET | ORAL | Status: DC

## 2016-04-28 MED ORDER — XARELTO 20 MG PO TABS: ORAL_TABLET | ORAL | Status: DC

## 2016-04-28 NOTE — Progress Notes (Signed)
CARDIOLOGY OFFICE NOTE  Date:  7/94/8016    Andrew [name] Date of Birth: [birth year] Medical Record #[id number]  PCP:  Leamon Arnt, MD  Cardiologist:  Burt Knack    Chief Complaint  Patient presents with  . Hyperlipidemia  . DVT    1 year check - seen for Dr. Burt Knack    History of Present Illness: Andrew [name] is a 65 y.o. male who presents today for a one year check. He is seen for Dr. Burt Knack.   He has a history of HLD and prior DVT. Has chronic swelling of the right leg. Has been on Xarelto in the past. Has also seen Dr. 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   Seen a year ago - was doing ok from our standpoint. Labs are checked by PCP. We do fill the Lipitor.   Comes back today. Here with Andrew [name], his brother. Andrew [name] provides a lot of the history due to the language barrier. Andrew [name] has been back to his [place] for the past year. He did suffer another DVT in the right leg while there - was put on Coumadin - now on Xarelto. Not fasting today - went to [employer] this morning. Loves salt. Has swelling in both legs - only wearing support stocking on the right. Also using NSAID. Complains of both legs hurting and wanting to have another duplex.   Past Medical History  Diagnosis Date  . DVT (deep venous thrombosis) (HCC)     right leg in 2011; prior injury to right foot  . HLD (hyperlipidemia)   . Right foot injury     No past surgical history on file.   Medications: Current Outpatient Prescriptions  Medication Sig Dispense Refill    . atorvastatin (LIPITOR) 40 MG tablet TAKE ONE TABLET BY MOUTH ONE TIME DAILY 90 tablet 3  . doxycycline (VIBRA-TABS) 100 MG tablet Take 1 tablet (100 mg total) by mouth 2 (two) times daily. 20 tablet 0  . gabapentin (NEURONTIN) 300 MG capsule TAKE ONE CAPSULE (300 MG TOTAL) BY MOUTH 3 (THREE) TIMES A DAY.  1  . HYDROcodone-acetaminophen (NORCO/VICODIN) 5-325 MG tablet TAKE 1 TABLET BY MOUTH EVERY 4 HRS AS NEEDED FOR PAIN  0  . predniSONE (DELTASONE) 20 MG tablet 3 TABS DAILY X 2 DAYS,2 TABS X 2 DAYS, 1 TAB X 3 DAYS, 1/2 TAB DAILY X 3 DAYS ONCE DAILY BY MOUTH  0  . PredniSONE 10 MG KIT 6 day tapering dose 1 kit 0  . terbinafine (LAMISIL) 250 MG tablet Take 1 tablet (250 mg total) by mouth daily. 90 tablet 0  . XARELTO 20 MG TABS tablet TAKE ONE TABLET (20 MG TOTAL) BY MOUTH DAILY. 90 tablet 3   No current facility-administered medications for this visit.    Allergies: Allergies  Allergen Reactions  . Influenza Vaccines     Broke out in hives and had breathing problems    Social History: The patient  reports that he quit smoking about 33 years ago. His smoking use included Cigarettes. He has never used smokeless  tobacco. He reports that he does not drink alcohol or use illicit drugs.   Family History: The patient's family history includes Heart disease in his father and mother; Vascular Disease in his maternal grandfather.   Review of Systems: Please see the history of present illness.   Otherwise, the review of systems is positive for none.   All other systems are reviewed and negative.   Physical Exam: VS:  BP 128/78 mmHg  Pulse 66  Ht '5\' 10"'$  (1.778 m)  Wt 219 lb 3.2 oz (99.428 kg)  BMI 31.45 kg/m2 .  BMI Body mass index is 31.45 kg/(m^2).  Wt Readings from Last 3 Encounters:  04/28/16 219 lb 3.2 oz (99.428 kg)  09/03/14 200 lb 1.9 oz (90.774 kg)  09/10/13 202 lb (91.627 kg)    General: Pleasant. Little hard to understand but he is alert and in no acute distress.  HEENT:  Normal. Neck: Supple, no JVD, carotid bruits, or masses noted.  Cardiac: Regular rate and rhythm. No murmurs, rubs, or gallops. Both legs with swelling. Lots of varicosities on the left. Support stocking just on the right.  Respiratory:  Lungs are clear to auscultation bilaterally with normal work of breathing.  GI: Soft and nontender.  MS: No deformity or atrophy. Gait and ROM intact. Skin: Warm and dry. Color is normal.  Neuro:  Strength and sensation are intact and no gross focal deficits noted.  Psych: Alert, appropriate and with normal affect.   LABORATORY DATA:  EKG:  EKG is ordered today. This demonstrates NSR.  Lab Results  Component Value Date   WBC 6.9 08/11/2013   HGB 15.0 08/11/2013   HCT 46.1 08/11/2013   PLT 221 08/11/2013   GLUCOSE 98 04/08/2014   CHOL 170 04/08/2014   TRIG 79.0 04/08/2014   HDL 57.40 04/08/2014   LDLCALC 97 04/08/2014   ALT 24 09/20/2014   AST 19 09/20/2014   NA 138 04/08/2014   K 4.2 04/08/2014   CL 104 04/08/2014   CREATININE 1.0 04/08/2014   BUN 20 04/08/2014   CO2 27 04/08/2014    BNP (last 3 results) No results for input(s): BNP in the last 8760 hours.  ProBNP (last 3 results) No results for input(s): PROBNP in the last 8760 hours.   Other Studies Reviewed Today:   Assessment/Plan: 1. 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 Needs follow up labs. Will recheck duplex. Would wear compression stockings on both legs. Avoid salt.   2. HLD - Needs fasting labs with PCP - he remains on his statin therapy.   3. Swelling - most likely from venous insufficiency - would wear support stockings on both legs. Avoid salt.   Current medicines are reviewed with the patient today.  The patient does not have concerns regarding medicines other than what has been noted above.  The following changes have been made:  See above.  Labs/ tests ordered today include:    Orders Placed This  Encounter  Procedures  . Basic metabolic panel  . CBC  . Hepatic function panel  . EKG 12-Lead     Disposition:   FU with Dr. Burt Knack in one year.   Patient is agreeable to this plan and will call if any problems develop in the interim.   Signed: Burtis Junes, RN, ANP-C 04/28/2016 11:06 AM  [facility] [address] [place], [place]  [address] Phone: [phone] Fax: [phone]  [phone]         

## 2016-04-28 NOTE — Patient Instructions (Addendum)
We will be checking the following labs today - BMET, CBC and HPF  Please get lipids checked by primary care doctor.    Medication Instructions:    Continue with your current medicines.   I sent in your refills today.   STOP Ibuprofen - no Advil, Aleve, Naproxen, etc - can increase the risk of bleeding    Testing/Procedures To Be Arranged:  Doppler study of lower legs.   Follow-Up:   See Dr. Burt Knack back in one year.     Other Special Instructions:   Restrict your salt.   Wear your compression stockings on both legs    If you need a refill on your cardiac medications before your next appointment, please call your pharmacy.   Call the [place] office at [phone] if you have any questions, problems or concerns.

## 2016-04-30 ENCOUNTER — Ambulatory Visit (HOSPITAL_COMMUNITY)
Admission: RE | Admit: 2016-04-30 | Discharge: 2016-04-30 | Disposition: A | Discharge status: Home / Self Care | Payer: Medicaid Other | Source: Ambulatory Visit | Attending: Internal Medicine | Admitting: Internal Medicine

## 2016-04-30 DIAGNOSIS — Z86718 Personal history of other venous thrombosis and embolism: Secondary | ICD-10-CM | POA: Insufficient documentation

## 2016-04-30 DIAGNOSIS — I82409 Acute embolism and thrombosis of unspecified deep veins of unspecified lower extremity: Secondary | ICD-10-CM | POA: Diagnosis not present

## 2016-04-30 DIAGNOSIS — R2240 Localized swelling, mass and lump, unspecified lower limb: Secondary | ICD-10-CM | POA: Insufficient documentation

## 2016-04-30 DIAGNOSIS — Z87891 Personal history of nicotine dependence: Secondary | ICD-10-CM | POA: Insufficient documentation

## 2016-04-30 DIAGNOSIS — E785 Hyperlipidemia, unspecified: Secondary | ICD-10-CM | POA: Insufficient documentation

## 2016-05-05 ENCOUNTER — Other Ambulatory Visit: Payer: Self-pay | Admitting: *Deleted

## 2016-05-05 DIAGNOSIS — M7120 Synovial cyst of popliteal space [Baker], unspecified knee: Secondary | ICD-10-CM

## 2016-06-02 DIAGNOSIS — Z7901 Long term (current) use of anticoagulants: Secondary | ICD-10-CM | POA: Insufficient documentation

## 2016-06-08 DIAGNOSIS — H2513 Age-related nuclear cataract, bilateral: Secondary | ICD-10-CM | POA: Insufficient documentation

## 2016-06-08 DIAGNOSIS — H5203 Hypermetropia, bilateral: Secondary | ICD-10-CM | POA: Insufficient documentation

## 2016-06-08 DIAGNOSIS — G43909 Migraine, unspecified, not intractable, without status migrainosus: Secondary | ICD-10-CM | POA: Insufficient documentation

## 2016-06-08 DIAGNOSIS — H524 Presbyopia: Secondary | ICD-10-CM | POA: Insufficient documentation

## 2016-06-09 ENCOUNTER — Ambulatory Visit: Payer: Medicaid Other | Attending: Family Medicine | Admitting: Physical Therapy

## 2016-06-09 DIAGNOSIS — M25562 Pain in left knee: Secondary | ICD-10-CM | POA: Insufficient documentation

## 2016-06-10 NOTE — Therapy (Signed)
[place], [place], [address] Phone: [phone]   Fax:  [phone]  Physical Therapy Treatment  Patient Details  Name: Dreylon Luscher MRN: [id number] Date of Birth: Apr 13, 1951 Referring Provider: Billey Chang MD  Encounter Date: 06/09/2016    Past Medical History  Diagnosis Date  . DVT (deep venous thrombosis) (HCC)     right leg in 2011; prior injury to right foot  . HLD (hyperlipidemia)   . Right foot injury     No past surgical history on file.  There were no vitals filed for this visit.                                        Plan - 06/10/16 1357    Clinical Impression Statement Educated pt upon arriving that they get only one visit for the year per Medicaid protocol. pt reported it wasn't his knee that was bothering him but it was more his back and hip; which was explained he would need an updated certificaiton. due to patient having medicaid as primary insurance and they only cover one visit. pt opted to save his one covered visit for a referral for his back at this time and will not be evaluated today.       Patient will benefit from skilled therapeutic intervention in order to improve the following deficits and impairments:     Visit Diagnosis: Pain in left knee     Problem List Patient Active Problem List   Diagnosis Date Noted  . Leg pain, right 10/03/2012  . Leg swelling 10/03/2012  . Right leg DVT ([place]) 10/03/2012  . HLD (hyperlipidemia) 07/25/2012  . DVT 12/24/2010   Starr Lake PT, DPT, LAT, ATC  06/10/2016  2:08 PM      [facility] [address] [place], [place], [address] Phone: [phone]   Fax:  [phone]  Name: Daison Stegen MRN: [id number] Date of Birth: 18-Dec-1950

## 2016-06-10 NOTE — Addendum Note (Signed)
Addended by: Larey Days on: 06/10/2016 02:57 PM   Modules accepted: Miquel Dunn

## 2016-06-10 NOTE — Addendum Note (Signed)
Addended by: Larey Days on: 06/10/2016 03:01 PM   Modules accepted: Miquel Dunn

## 2016-07-14 ENCOUNTER — Ambulatory Visit: Payer: Medicaid Other | Admitting: Podiatry

## 2016-07-15 ENCOUNTER — Encounter: Payer: Self-pay | Admitting: Podiatry

## 2016-07-15 ENCOUNTER — Ambulatory Visit (INDEPENDENT_AMBULATORY_CARE_PROVIDER_SITE_OTHER): Payer: Medicaid Other | Admitting: Podiatry

## 2016-07-15 VITALS — BP 124/71 | HR 63 | Resp 16 | Ht 70.0 in | Wt 195.0 lb

## 2016-07-15 DIAGNOSIS — M7750 Other enthesopathy of unspecified foot: Secondary | ICD-10-CM | POA: Diagnosis not present

## 2016-07-15 DIAGNOSIS — B351 Tinea unguium: Secondary | ICD-10-CM

## 2016-07-15 DIAGNOSIS — M779 Enthesopathy, unspecified: Secondary | ICD-10-CM

## 2016-07-15 MED ORDER — TRIAMCINOLONE ACETONIDE 10 MG/ML IJ SUSP
10.0000 mg | Freq: Once | INTRAMUSCULAR | Status: AC
  Administered 2016-07-15: 10 mg

## 2016-07-15 NOTE — Progress Notes (Signed)
Subjective:     Patient ID: Andrew [name], male   DOB: [birth year], 65 y.o.   MRN: [id number]  HPI patient presents stating that he has several nails that become dark and they're thick and he's been on Lamisil in the past and is wondering if it's appropriate and also is complaining of a lot of pain on the inside of the right ankle states it's been hurting him for several months. Does wear compression stockings for chronic ulcerations of the venous system   Review of Systems     Objective:   Physical Exam Neurovascular status found to be intact with discolored nails second bilateral third left it's thickened and localized. Patient also is noted to have inflammation and pain around the posterior tibial tendon right as it comes under the medial malleolus and inserts into the navicular. Patient has good digital perfusion and is well oriented 3    Assessment:     Tendinitis of the posterior tibial tendon right along with damage nailbed with mycotic component localized but trauma as more the factor    Plan:     H&P both conditions discussed and carefully injected the sheath of the posterior tibial tendon 3 mg Kenalog 5 mg Xylocaine and discussed nail disease and do not recommend oral medicine but allowing these nails to regrow due to the trauma element of the condition. Reappoint to recheck also applied fascial brace to lift up the medial arch right

## 2016-10-01 ENCOUNTER — Ambulatory Visit (INDEPENDENT_AMBULATORY_CARE_PROVIDER_SITE_OTHER): Payer: Medicaid Other | Admitting: Specialist

## 2016-10-01 DIAGNOSIS — M47816 Spondylosis without myelopathy or radiculopathy, lumbar region: Secondary | ICD-10-CM | POA: Diagnosis not present

## 2016-10-01 DIAGNOSIS — M5416 Radiculopathy, lumbar region: Secondary | ICD-10-CM | POA: Diagnosis not present

## 2016-10-11 ENCOUNTER — Encounter (INDEPENDENT_AMBULATORY_CARE_PROVIDER_SITE_OTHER): Payer: Self-pay | Admitting: Physical Medicine and Rehabilitation

## 2016-10-11 ENCOUNTER — Ambulatory Visit (INDEPENDENT_AMBULATORY_CARE_PROVIDER_SITE_OTHER): Payer: Medicaid Other | Admitting: Physical Medicine and Rehabilitation

## 2016-10-11 VITALS — BP 138/81 | HR 55 | Temp 98.0°F

## 2016-10-11 DIAGNOSIS — M47816 Spondylosis without myelopathy or radiculopathy, lumbar region: Secondary | ICD-10-CM | POA: Diagnosis not present

## 2016-10-11 MED ORDER — LIDOCAINE HCL (PF) 1 % IJ SOLN
0.3300 mL | Freq: Once | INTRAMUSCULAR | Status: AC
  Administered 2016-10-11: 0.3 mL

## 2016-10-11 MED ORDER — METHYLPREDNISOLONE ACETATE 80 MG/ML IJ SUSP
80.0000 mg | Freq: Once | INTRAMUSCULAR | Status: AC
  Administered 2016-10-11: 80 mg

## 2016-10-11 MED ORDER — BUPIVACAINE HCL 0.25 % IJ SOLN: 2.0000 mL | Freq: Once | INTRAMUSCULAR | Status: DC

## 2016-10-11 NOTE — Procedures (Signed)
Lumbar Facet Joint Intra-Articular Injection(s) with Fluoroscopic Guidance  Patient: Andrew [name]      Date of Birth: October 27, 1951 MRN: [id number] PCP: Leamon Arnt, MD      Visit Date: 10/11/2016   Universal Protocol:    Date/Time: 10/11/1709:29 AM  Consent Given By: the patient  Position: PRONE   Additional Comments: Vital signs were monitored before and after the procedure. Patient was prepped and draped in the usual sterile fashion. The correct patient, procedure, and site was verified.   Injection Procedure Details:  Procedure Site One Meds Administered:  Meds ordered this encounter  Medications  . lidocaine (PF) (XYLOCAINE) 1 % injection 0.3 mL  . bupivacaine (MARCAINE) 0.25 % (with pres) injection 2 mL  . methylPREDNISolone acetate (DEPO-MEDROL) injection 80 mg     Laterality: Left  Location/Site:  L4-L5  Needle size: 22 guage  Needle type: Spinal  Needle Placement: Articular  Findings:  -Contrast Used: 1 mL iohexol 180 mg iodine/mL   -Comments: Excellent flow of contrast producing a partial arthrogram.  Procedure Details: The fluoroscope beam is vertically oriented in AP, and the inferior recess is visualized beneath the lower pole of the inferior apophyseal process, which represents the target point for needle insertion. When direct visualization is difficult the target point is located at the medial projection of the vertebral pedicle. The region overlying each aforementioned target is locally anesthetized with a 1 to 2 ml. volume of 1% Lidocaine without Epinephrine.   The spinal needle was inserted into each of the above mentioned facet joints using biplanar fluoroscopic guidance. A 0.25 to 0.5 ml. volume of Isovue-250 was injected and a partial facet joint arthrogram was obtained. A single spot film was obtained of the resulting arthrogram.    One to 1.25 ml of the steroid/anesthetic solution was then injected into each of the facet joints noted  above.   Additional Comments:  The patient tolerated the procedure well Dressing: Band-Aid    Post-procedure details: Patient was observed during the procedure. Post-procedure instructions were reviewed.  Patient left the clinic in stable condition.

## 2016-10-11 NOTE — Progress Notes (Signed)
Office Visit Note  Patient: Andrew [name]           Date of Birth: [birth year]           MRN: [id number] Visit Date: 10/11/2016              Requested by: Leamon Arnt, MD [address] [place], [place] [address] PCP: Leamon Arnt, MD   Assessment & Plan: Visit Diagnoses:  1. Spondylosis without myelopathy or radiculopathy, lumbar region     Follow-Up Instructions: Return With Dr. Louanne Skye has scheduled, for Recheck spine.  Orders:  Orders Placed This Encounter  Procedures  . Nerve Block    Meds ordered this encounter  Medications  . lidocaine (PF) (XYLOCAINE) 1 % injection 0.3 mL  . bupivacaine (MARCAINE) 0.25 % (with pres) injection 2 mL  . methylPREDNISolone acetate (DEPO-MEDROL) injection 80 mg      Procedures: Lumbar Facet Joint Intra-Articular Injection(s) with Fluoroscopic Guidance  Patient: Wences Delpozo      Date of Birth: Feb 04, 1951 MRN: [id number] PCP: Leamon Arnt, MD      Visit Date: 10/11/2016   Universal Protocol:    Date/Time: 10/11/1709:29 AM  Consent Given By: the patient  Position: PRONE   Additional Comments: Vital signs were monitored before and after the procedure. Patient was prepped and draped in the usual sterile fashion. The correct patient, procedure, and site was verified.   Injection Procedure Details:  Procedure Site One Meds Administered:  Meds ordered this encounter  Medications  . lidocaine (PF) (XYLOCAINE) 1 % injection 0.3 mL  . bupivacaine (MARCAINE) 0.25 % (with pres) injection 2 mL  . methylPREDNISolone acetate (DEPO-MEDROL) injection 80 mg     Laterality: Left  Location/Site:  L4-L5  Needle size: 22 guage  Needle type: Spinal  Needle Placement: Articular  Findings:  -Contrast Used: 1 mL iohexol 180 mg iodine/mL   -Comments: Excellent flow of contrast producing a partial arthrogram.  Procedure Details: The fluoroscope beam is vertically oriented in AP, and the inferior recess is  visualized beneath the lower pole of the inferior apophyseal process, which represents the target point for needle insertion. When direct visualization is difficult the target point is located at the medial projection of the vertebral pedicle. The region overlying each aforementioned target is locally anesthetized with a 1 to 2 ml. volume of 1% Lidocaine without Epinephrine.   The spinal needle was inserted into each of the above mentioned facet joints using biplanar fluoroscopic guidance. A 0.25 to 0.5 ml. volume of Isovue-250 was injected and a partial facet joint arthrogram was obtained. A single spot film was obtained of the resulting arthrogram.    One to 1.25 ml of the steroid/anesthetic solution was then injected into each of the facet joints noted above.   Additional Comments:  The patient tolerated the procedure well Dressing: Band-Aid    Post-procedure details: Patient was observed during the procedure. Post-procedure instructions were reviewed.  Patient left the clinic in stable condition.      Other Procedures: No procedures performed   Clinical Data: No additional findings.   Subjective: Chief Complaint  Patient presents with  . Lower Back - Pain    HPI low back pain x 1 year. Gotten worse over time. Down left leg to foot. Worse with walking. Relief with sitting.His friend is with him today and ask as interpreter. The patient does understand some English but does not speak it very well. He is having axial low back pain  on the left for some referral pattern. 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  Blood thinner-Xarelto No dye allergy  Review of Systems   Objective: Vital Signs: BP 138/81 (BP Location: Left Arm, Patient Position: Sitting, Cuff Size: Normal)   Pulse (!) 55   Temp 98 F (36.7 C) (Oral)   SpO2 95%    Physical Exam General appearance: NAD, conversant  Psych: Appropriate affect, alert and oriented to  person, place and time  Eyes: anicteric sclerae, moist conjunctivae; no lid-lag; PERRLA Lungs: normal respiratory effort and no intercostal retractions, no wheezing CVA: normal pulses Extremities: No peripheral edema  Skin: Normal temperature, turgor and texture; no rash, ulcers or subcutaneous nodules MSK:/Neuro:   On manual muscle testing there is 5/5 strength in the distal muscle groups of the lower extremities bilaterally without deficits. There is no clonus test bilaterally.   Ortho Exam  Specialty Comments:  No specialty comments available. Imaging: No results found.   PMFS History: Patient Active Problem List   Diagnosis Date Noted  . 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 Leg pain, right 10/03/2012  . Leg swelling 10/03/2012  . Right leg DVT ([place]) 10/03/2012  . HLD (hyperlipidemia) 07/25/2012  . DVT 12/24/2010   Past Medical History:  Diagnosis Date  . DVT (deep venous thrombosis) (HCC)    right leg in 2011; prior injury to right foot  . HLD (hyperlipidemia)   . Right foot injury     Family History  Problem Relation Age of Onset  . Heart disease Mother   . Heart disease Father   . Vascular Disease Maternal Grandfather    History reviewed. No pertinent surgical history. Social History   Occupational History  . Not on file.   Social History Main Topics  . Smoking status: Former Smoker    Types: Cigarettes    Quit date: 07/25/1982  . Smokeless tobacco: Never Used     Comment: REMOTE SOCIAL HISTORY  . Alcohol use No  . Drug use: No  . Sexual activity: Not Currently

## 2016-10-12 ENCOUNTER — Other Ambulatory Visit: Payer: Self-pay | Admitting: Nurse Practitioner

## 2016-10-21 ENCOUNTER — Other Ambulatory Visit: Payer: Self-pay | Admitting: Nurse Practitioner

## 2016-10-21 NOTE — Telephone Encounter (Signed)
atorvastatin (LIPITOR) 40 MG tablet  Medication  Date: 04/28/2016 Department: [address] Office Ordering/Authorizing: Burtis Junes, NP  Order Providers   Prescribing Provider Encounter Provider  Burtis Junes, NP Burtis Junes, NP  Supervision Information   Supervising Provider Type of Supervision  Peter M Martinique, MD Incident To  Medication Detail    Disp Refills Start End   atorvastatin (LIPITOR) 40 MG tablet 90 tablet 3 04/28/2016    Sig: TAKE ONE TABLET BY MOUTH ONE TIME DAILY   E-Prescribing Status: Receipt confirmed by pharmacy (04/28/2016 11:03 AM EDT)   Pharmacy   [facility], [place] - [address]

## 2016-11-12 ENCOUNTER — Ambulatory Visit (INDEPENDENT_AMBULATORY_CARE_PROVIDER_SITE_OTHER): Payer: Medicaid Other | Admitting: Specialist

## 2016-11-12 ENCOUNTER — Encounter (INDEPENDENT_AMBULATORY_CARE_PROVIDER_SITE_OTHER): Payer: Self-pay | Admitting: Specialist

## 2016-11-12 VITALS — BP 146/75 | HR 72 | Ht 70.0 in | Wt 205.0 lb

## 2016-11-12 DIAGNOSIS — M47816 Spondylosis without myelopathy or radiculopathy, lumbar region: Secondary | ICD-10-CM

## 2016-11-12 NOTE — Progress Notes (Signed)
Office Visit Note   Patient: Andrew [name]           Date of Birth: [birth year]           MRN: [id number] Visit Date: 11/12/2016              Requested by: Leamon Arnt, MD [address] [place], [place] [address] PCP: Leamon Arnt, MD   Assessment & Plan: Visit Diagnoses:  1. Spondylosis without myelopathy or radiculopathy, lumbar region     Plan:Avoid frequency bending, stooping and avoid lifting weights greater than 25 lbs. Avoid prolong standing and walking. Expect improved walking and standing tolerance.    Follow-Up Instructions: Return if symptoms worsen or fail to improve.   Orders:  No orders of the defined types were placed in this encounter.  No orders of the defined types were placed in this encounter.     Procedures: No procedures performed   Clinical Data: No additional findings.   Subjective: Chief Complaint  Patient presents with  . Lower Back - Pain, Follow-up    Patient is returning today to follow up with lumbar facet joint injection done on 10/11/2016 with Dr. Ernestina Patches. States this injection/aspiration has helped a lot. Not having any pain. Doing much better.  Review of Systems  HENT: Positive for rhinorrhea, sinus pain, sinus pressure and sneezing.   Eyes: Positive for visual disturbance.  Respiratory: Positive for cough and wheezing.   Cardiovascular: Negative.   Gastrointestinal: Negative.   Endocrine: Negative.   Genitourinary: Negative.   Musculoskeletal: Positive for back pain, gait problem and joint swelling.  Skin: Negative.   Allergic/Immunologic: Negative.   Neurological: Positive for weakness and numbness.  Hematological: Negative.   Psychiatric/Behavioral: Negative.      Objective: Vital Signs: BP (!) 146/75   Pulse 72   Ht 5\' 10"  (1.778 m)   Wt 205 lb (93 kg)   BMI 29.41 kg/m   Physical Exam  Constitutional: He is oriented to person, place, and time. He appears well-developed and well-nourished.  Eyes: EOM are normal. Pupils are equal, round, and reactive to light.  Neck: Normal range of motion. Neck supple.  Pulmonary/Chest: Effort normal and breath sounds normal.  Abdominal: Soft. Bowel sounds are normal.  Musculoskeletal: Normal range of motion.  Neurological: He is alert and oriented to person, place, and time.  Skin: Skin is warm and dry.  Psychiatric: He has a normal mood and affect. His behavior is normal. Judgment and thought content normal.    Back Exam   Tenderness  The patient is experiencing tenderness in the lumbar.  Range of Motion  Extension: normal  Flexion: normal  Lateral Bend Right: normal  Lateral Bend Left: normal  Rotation Right: normal  Rotation Left: normal   Muscle Strength  Right Quadriceps:  5/5  Left Quadriceps:   5/5  Right Hamstrings:  5/5  Left Hamstrings:  5/5   Tests  Straight leg raise right: negative Straight leg raise left: negative  Reflexes  Patellar: normal Achilles: normal Babinski's sign: normal   Other  Toe Walk: normal Heel Walk: normal Sensation: normal Gait: normal  Erythema: no back redness Scars: absent      Specialty Comments:  No specialty comments available.  Imaging: No results found.   PMFS History: Patient Active Problem List   Diagnosis Date Noted  . 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 Leg pain, right 10/03/2012  . Leg swelling 10/03/2012  . Right leg DVT ([place]) 10/03/2012  . HLD (hyperlipidemia) 07/25/2012  . DVT 12/24/2010   Past Medical History:  Diagnosis Date  . DVT (deep venous thrombosis) (HCC)    right leg in 2011; prior injury to right foot  . HLD (hyperlipidemia)   . Right foot injury     Family History  Problem Relation Age of Onset  . Heart disease Mother   . Heart disease Father   . Vascular Disease Maternal Grandfather     No past surgical history on file. Social History   Occupational History  . Not on file.   Social History Main Topics  . Smoking status: Former Smoker    Types: Cigarettes    Quit date: 07/25/1982  . Smokeless tobacco: Never Used     Comment: REMOTE SOCIAL HISTORY  . Alcohol use No  . Drug use: No  . Sexual activity: Not Currently

## 2016-11-12 NOTE — Patient Instructions (Signed)
Avoid frequency bending, stooping and avoid lifting weights greater than 25 lbs. Avoid prolong standing and walking. Expect improved walking and standing tolerance.

## 2017-11-28 ENCOUNTER — Telehealth: Payer: Self-pay | Admitting: *Deleted

## 2017-11-28 NOTE — Telephone Encounter (Signed)
Pt's sister in law called Kathrene Alu, on personal cell phone and stated this pt needed appt.Cecille Rubin gave the pt's number and called back to schedule appt.

## 2018-01-12 ENCOUNTER — Ambulatory Visit: Payer: Self-pay | Admitting: Cardiovascular Disease

## 2018-03-02 ENCOUNTER — Encounter: Payer: Self-pay | Admitting: Cardiovascular Disease

## 2018-03-02 ENCOUNTER — Ambulatory Visit: Payer: Medicaid Other | Admitting: Cardiovascular Disease

## 2018-03-02 VITALS — BP 124/66 | HR 55 | Ht 70.0 in | Wt 211.2 lb

## 2018-03-02 DIAGNOSIS — R0602 Shortness of breath: Secondary | ICD-10-CM

## 2018-03-02 DIAGNOSIS — I82409 Acute embolism and thrombosis of unspecified deep veins of unspecified lower extremity: Secondary | ICD-10-CM | POA: Diagnosis not present

## 2018-03-02 MED ORDER — RIVAROXABAN 10 MG PO TABS: 10.0000 mg | ORAL_TABLET | Freq: Every day | ORAL | 3 refills | Status: DC

## 2018-03-02 MED ORDER — ATORVASTATIN CALCIUM 40 MG PO TABS: 40.0000 mg | ORAL_TABLET | Freq: Every day | ORAL | 3 refills | Status: DC

## 2018-03-02 NOTE — Progress Notes (Signed)
Cardiology Office Note Date:  03/02/2018   ID:  Travone Georg, DOB [birth year], MRN [id number]  PCP:  Bernerd Limbo, MD  Cardiologist:  Sherren Mocha, MD    Chief Complaint  Patient presents with  . Follow-up    DVT     History of Present Illness: Andrew [name] is a 67 y.o. male who presents for follow-up of hyperlipidemia and history of DVT.  He was last seen here in 2017.  At that time he had another DVT and it was felt he should be on lifelong anticoagulation.  He has had chronic leg edema and is also been followed by vascular surgery.  Compression stockings have been prescribed.  The patient is here today with his brother.  He has been doing relatively well.  He has had some problems with epistaxis recently.  Otherwise no complaints over the last few months.  He did have an episode of acute shortness of breath and discomfort in his left arm associated with stress of international travel in December 2018.  He developed some diaphoresis and numbness down the arm as well.  Apparently he underwent evaluation in [place] and by report his troponins were negative.  No other evaluation was done at that time.  He has had no recurrence of chest pain or shortness of breath.  He has chronic leg swelling but manages this with compression stockings and reports no change in symptoms.  He denies orthopnea or PND.   Past Medical History:  Diagnosis Date  . DVT (deep venous thrombosis) (HCC)    right leg in 2011; prior injury to right foot  . HLD (hyperlipidemia)   . Right foot injury     History reviewed. No pertinent surgical history.  Current Outpatient Medications  Medication Sig Dispense Refill  . atorvastatin (LIPITOR) 40 MG tablet Take 1 tablet (40 mg total) by mouth daily at 6 PM. 90 tablet 3  . gabapentin (NEURONTIN) 300 MG capsule TAKE ONE CAPSULE (300 MG TOTAL) BY MOUTH 3 (THREE) TIMES A DAYAS NEEDED FOR PAIN  1  . rivaroxaban (XARELTO) 10 MG TABS tablet Take 1 tablet (10 mg  total) by mouth daily. 90 tablet 3   Current Facility-Administered Medications  Medication Dose Route Frequency Provider Last Rate Last Dose  . bupivacaine (MARCAINE) 0.25 % (with pres) injection 2 mL  2 mL Other Once Magnus Sinning, MD        Allergies:   Penicillins and Influenza vaccines   Social History:  The patient  reports that he quit smoking about 35 years ago. His smoking use included cigarettes. He has never used smokeless tobacco. He reports that he does not drink alcohol or use drugs.   Family History:  The patient's family history includes Heart disease in his father and mother; Vascular Disease in his maternal grandfather.    ROS:  Please see the history of present illness.  Otherwise, review of systems is positive for leg swelling.  All other systems are reviewed and negative.    PHYSICAL EXAM: VS:  BP 124/66   Pulse (!) 55   Ht 5\' 10"  (1.778 m)   Wt 211 lb 3.2 oz (95.8 kg)   SpO2 97%   BMI 30.30 kg/m  , BMI Body mass index is 30.3 kg/m. GEN: Well nourished, well developed, in no acute distress  HEENT: normal  Neck: no JVD, no masses. No carotid bruits Cardiac: RRR without murmur or gallop  Respiratory:  clear to auscultation bilaterally, normal work of breathing GI: soft, nontender, nondistended, + BS MS: no deformity or atrophy  Ext: 1+ pretibial edema, pedal pulses 2+= bilaterally, varicosities, spider veins noted Skin: warm and dry, no rash Neuro:  Strength and sensation are intact Psych: euthymic mood, full affect  EKG:  EKG is ordered today. The ekg ordered today shows sinus bradycardia 55 bpm, otherwise within normal limits.  Recent Labs: No results found for requested labs within last 8760 hours.   Lipid Panel     Component Value Date/Time   CHOL 170 04/08/2014 1006   TRIG 79.0 04/08/2014 1006   HDL 57.40 04/08/2014 1006   CHOLHDL 3 04/08/2014 1006   VLDL 15.8 04/08/2014 1006   LDLCALC 97 04/08/2014 1006      Wt Readings  from Last 3 Encounters:  03/02/18 211 lb 3.2 oz (95.8 kg)  11/12/16 205 lb (93 kg)  07/15/16 195 lb (88.5 kg)     ASSESSMENT AND PLAN: 1.  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  Reviewed potential treatment options and considering his recent problems with epistaxis it seems reasonable to reduce his rivaroxaban dose to 10 mg daily.  This dose is approved for long-term DVT prevention after 6 months of initial therapy.  2.  Hyperlipidemia: Most recent labs reviewed with an LDL cholesterol of 119, HDL 47, triglycerides 119, total cholesterol 190.  Considering absence of diabetes or coronary artery disease, reasonable to continue on current therapy.  Lifestyle modification reviewed with the patient.  3.  Shortness of breath/chest pain: Apparent isolated episode associated with stress in December 2018.  Considering his risk factors of hyperlipidemia and family history of CAD, I would like to assess him with an exercise stress echocardiogram to evaluate for significant ischemic changes.  Current medicines are reviewed with the patient today.  The patient does not have concerns regarding medicines.  Labs/ tests ordered today include:   Orders Placed This Encounter  Procedures  . EKG 12-Lead  . ECHOCARDIOGRAM STRESS TEST    Disposition:   FU one year  Signed, Sherren Mocha, MD  03/02/2018 1:28 PM    [facility] [place], [place], [place]  [address] Phone: [phone]; Fax: [phone]

## 2018-03-02 NOTE — Patient Instructions (Signed)
Medication Instructions:  1) DECREASE XARELTO to 10 mg daily  Labwork: None  Testing/Procedures: Your physician has requested that you have a stress echocardiogram. For further information please visit [email or web address]. Please follow instruction sheet as given.  Your physician has requested that you have a lower extremity venous duplex. This test is an ultrasound of the veins in the legs. It looks at venous blood flow that carries blood from the heart to the legs. Allow one hour for a Lower Venous exam. There are no restrictions or special instructions.  Follow-Up: Your provider wants you to follow-up in: 1 year with Dr. Burt Knack. You will receive a reminder letter in the mail two months in advance. If you don't receive a letter, please call our office to schedule the follow-up appointment.    Any Other Special Instructions Will Be Listed Below (If Applicable).     If you need a refill on your cardiac medications before your next appointment, please call your pharmacy.

## 2018-03-03 ENCOUNTER — Ambulatory Visit (HOSPITAL_COMMUNITY)
Admission: RE | Admit: 2018-03-03 | Discharge: 2018-03-03 | Disposition: A | Discharge status: Home / Self Care | Payer: Medicaid Other | Source: Ambulatory Visit | Attending: Cardiovascular Disease | Admitting: Cardiovascular Disease

## 2018-03-03 DIAGNOSIS — I82409 Acute embolism and thrombosis of unspecified deep veins of unspecified lower extremity: Secondary | ICD-10-CM

## 2018-03-23 ENCOUNTER — Other Ambulatory Visit (HOSPITAL_COMMUNITY): Payer: Medicaid Other

## 2018-03-30 ENCOUNTER — Telehealth (HOSPITAL_COMMUNITY): Payer: Self-pay | Admitting: *Deleted

## 2018-03-30 NOTE — Telephone Encounter (Signed)
Patient given detailed instructions per Stress Test Requisition Sheet for test on 04/05/18 at 2:30. Patient Notified to arrive 30 minutes early, and that it is imperative to arrive on time for appointment to keep from having the test rescheduled.  Patient verbalized understanding. Andrew [name]

## 2018-04-05 ENCOUNTER — Ambulatory Visit (HOSPITAL_COMMUNITY): Payer: Medicaid Other | Attending: Cardiovascular Disease

## 2018-04-05 ENCOUNTER — Ambulatory Visit (HOSPITAL_COMMUNITY): Payer: Medicaid Other

## 2018-04-05 DIAGNOSIS — R0602 Shortness of breath: Secondary | ICD-10-CM | POA: Insufficient documentation

## 2018-04-05 DIAGNOSIS — Z6830 Body mass index (BMI) 30.0-30.9, adult: Secondary | ICD-10-CM | POA: Insufficient documentation

## 2018-04-05 DIAGNOSIS — E785 Hyperlipidemia, unspecified: Secondary | ICD-10-CM | POA: Insufficient documentation

## 2018-04-05 DIAGNOSIS — E669 Obesity, unspecified: Secondary | ICD-10-CM | POA: Insufficient documentation

## 2018-10-25 ENCOUNTER — Encounter: Payer: Self-pay | Admitting: Nurse Practitioner

## 2018-10-25 ENCOUNTER — Telehealth: Payer: Self-pay

## 2018-10-25 ENCOUNTER — Ambulatory Visit: Payer: Medicaid Other | Admitting: Nurse Practitioner

## 2018-10-25 VITALS — BP 128/80 | HR 72 | Ht 70.0 in | Wt 205.1 lb

## 2018-10-25 DIAGNOSIS — Z7901 Long term (current) use of anticoagulants: Secondary | ICD-10-CM | POA: Diagnosis not present

## 2018-10-25 DIAGNOSIS — R252 Cramp and spasm: Secondary | ICD-10-CM

## 2018-10-25 DIAGNOSIS — I82409 Acute embolism and thrombosis of unspecified deep veins of unspecified lower extremity: Secondary | ICD-10-CM | POA: Diagnosis not present

## 2018-10-25 LAB — BASIC METABOLIC PANEL
BUN/Creatinine Ratio: 15 (ref 10–24)
BUN: 16 mg/dL (ref 8–27)
CO2: 25 mmol/L (ref 20–29)
Calcium: 9.6 mg/dL (ref 8.6–10.2)
Chloride: 103 mmol/L (ref 96–106)
Creatinine, Ser: 1.07 mg/dL (ref 0.76–1.27)
GFR calc Af Amer: 82 mL/min/{1.73_m2} (ref >59)
GFR calc non Af Amer: 71 mL/min/{1.73_m2} (ref >59)
Glucose: 98 mg/dL (ref 65–99)
Potassium: 5.1 mmol/L (ref 3.5–5.2)
Sodium: 142 mmol/L (ref 134–144)

## 2018-10-25 LAB — CBC
Hematocrit: 43 % (ref 37.5–51.0)
Hemoglobin: 14.6 g/dL (ref 13.0–17.7)
MCH: 31 pg (ref 26.6–33.0)
MCHC: 34 g/dL (ref 31.5–35.7)
MCV: 91 fL (ref 79–97)
Platelets: 276 10*3/uL (ref 150–450)
RBC: 4.71 x10E6/uL (ref 4.14–5.80)
RDW: 12.2 % — ABNORMAL LOW (ref 12.3–15.4)
WBC: 5.1 10*3/uL (ref 3.4–10.8)

## 2018-10-25 LAB — MAGNESIUM: Magnesium: 2 mg/dL (ref 1.6–2.3)

## 2018-10-25 NOTE — Telephone Encounter (Signed)
LMTCB

## 2018-10-25 NOTE — Patient Instructions (Addendum)
We will be checking the following labs today - BMET, MG and CBC    Medication Instructions:    Continue with your current medicines.    If you need a refill on your cardiac medications before your next appointment, please call your pharmacy.     Testing/Procedures To Be Arranged:  N/A  Follow-Up:   See Dr. Burt Knack in March    At [facility], you and your health needs are our priority.  As part of our continuing mission to provide you with exceptional heart care, we have created designated Provider Care Teams.  These Care Teams include your primary Cardiologist (physician) and Advanced Practice Providers (APPs -  Physician Assistants and Nurse Practitioners) who all work together to provide you with the care you need, when you need it.  Special Instructions:  . Let's see what the lab shows - if this is ok - would try some tonic water for the cramps  Call the [place] office at [phone] if you have any questions, problems or concerns.

## 2018-10-25 NOTE — Progress Notes (Signed)
CARDIOLOGY OFFICE NOTE  Date:  54/65/0354    Marcial Pacas Date of Birth: [birth year] Medical Record #[id number]  PCP:  Bernerd Limbo, MD  Cardiologist:  Jerel Shepherd    Chief Complaint  Patient presents with  . Follow-up    Work in visit for cramps - seen for Dr. Burt Knack    History of Present Illness: Andrew [name] is a 67 y.o. male who presents today for a 8 month check. Seen for Dr. Burt Knack.   He has a history of HLD and history of DVT.  He is on lifelong anticoagulation.  He has had chronic leg edema and has also been followed by vascular surgery.  Compression stockings have been prescribed.  Last seen here in March by Dr. Burt Knack - doing relatively well. Some epistaxis but otherwise ok. Had been out of the country last December and developed diaphoresis and numbness down the arm - was seen in [place] and troponins were negative. No other evaluation noted to have been done. He had had no recurrence of his symptoms.   Comes in today. Here with his sister-in-law today(Mary) and her sister.  Mary augments the history. He is here because of having some cramps - in his hands, legs, feet - mostly at night. Notes a "drawing" sensation of his fingers. Sounds like having MRI with neurosurgery OV coming up. No chest pain. Not short of breath. Tolerating low dose Xarelto. No recent labs noted. Has been using some narcotic for leg pain.   Past Medical History:  Diagnosis Date  . DVT (deep venous thrombosis) (HCC)    right leg in 2011; prior injury to right foot  . HLD (hyperlipidemia)   . Right foot injury     History reviewed. No pertinent surgical history.   Medications: Current Meds  Medication Sig  . atorvastatin (LIPITOR) 40 MG tablet Take 1 tablet (40 mg total) by mouth daily at 6 PM.  . HYDROcodone-acetaminophen (NORCO/VICODIN) 5-325 MG tablet TAKE 1 TABLET BY MOUTH EVERY 4 HRS AS NEEDED FOR PAIN  . rivaroxaban (XARELTO) 10 MG TABS tablet Take 1 tablet (10 mg  total) by mouth daily.   Current Facility-Administered Medications for the 10/25/18 encounter (Office Visit) with Burtis Junes, NP  Medication  . bupivacaine (MARCAINE) 0.25 % (with pres) injection 2 mL     Allergies: Allergies  Allergen Reactions  . Penicillins Anaphylaxis  . Influenza Vaccines     Broke out in hives and had breathing problems    Social History: The patient  reports that he quit smoking about 36 years ago. His smoking use included cigarettes. He has never used smokeless tobacco. He reports that he does not drink alcohol or use drugs.   Family History: The patient's family history includes Heart disease in his father and mother; Vascular Disease in his maternal grandfather.   Review of Systems: Please see the history of present illness.   Otherwise, the review of systems is positive for none.   All other systems are reviewed and negative.   Physical Exam: VS:  BP 128/80 (BP Location: Left Arm, Patient Position: Sitting, Cuff Size: Normal)   Pulse 72   Ht 5\' 10"  (1.778 m)   Wt 205 lb 1.9 oz (93 kg)   SpO2 93% Comment: at rest  BMI 29.43 kg/m  .  BMI Body mass index is 29.43 kg/m.  Wt Readings from Last 3 Encounters:  10/25/18 205 lb 1.9 oz (93 kg)  03/02/18 211 lb 3.2 oz (  95.8 kg)  11/12/16 205 lb (93 kg)    General: Pleasant. Alert and in no acute distress.   HEENT: Normal.  Neck: Supple, no JVD, carotid bruits, or masses noted.  Cardiac: Regular rate and rhythm. No murmurs, rubs, or gallops. Respiratory:  Lungs are clear to auscultation bilaterally with normal work of breathing.  GI: Soft and nontender.  MS: No deformity or atrophy. Gait and ROM intact.  Skin: Warm and dry. Color is normal.  Neuro:  Strength and sensation are intact and no gross focal deficits noted.  Psych: Alert, appropriate and with normal affect.   LABORATORY DATA:  EKG:  EKG is not ordered today.  Lab Results  Component Value Date   WBC 8.1 04/28/2016   HGB 14.0  04/28/2016   HCT 43.6 04/28/2016   PLT 220 04/28/2016   GLUCOSE 103 (H) 04/28/2016   CHOL 170 04/08/2014   TRIG 79.0 04/08/2014   HDL 57.40 04/08/2014   LDLCALC 97 04/08/2014   ALT 25 04/28/2016   AST 18 04/28/2016   NA 141 04/28/2016   K 3.7 04/28/2016   CL 106 04/28/2016   CREATININE 1.07 04/28/2016   BUN 14 04/28/2016   CO2 25 04/28/2016     BNP (last 3 results) No results for input(s): BNP in the last 8760 hours.  ProBNP (last 3 results) No results for input(s): PROBNP in the last 8760 hours.   Other Studies Reviewed Today:  Stress Echo Impressions 03/2018:  - This is intrepreted as a negative stress echo.   There is no evidence of ischemia. Normal LV function  Assessment/Plan:  1. Cramps - will check lab - not sure much to do - if lab is ok could try some tonic water.   2. Chronic DVT - on maintenance dose of Xarelto (had had 6 months of initial therapy) - needs surveillance labs today.   3. Prior epistaxis - not noted today  4. Prior chest pain - negative stress echo from April - this has not recurred.   Current medicines are reviewed with the patient today.  The patient does not have concerns regarding medicines other than what has been noted above.  The following changes have been made:  See above.  Labs/ tests ordered today include:    Orders Placed This Encounter  Procedures  . Basic metabolic panel  . CBC  . Magnesium     Disposition:   FU with Dr. Burt Knack as planned in 2020.   Patient is agreeable to this plan and will call if any problems develop in the interim.   SignedTruitt Merle, NP  10/25/2018 12:06 PM  [facility] [address] [place], [place]  [address] Phone: [phone] Fax: [phone]

## 2018-10-25 NOTE — Telephone Encounter (Signed)
-----   Message from Burtis Junes, NP sent at 10/25/2018  3:47 PM EST ----- Ok to report. May need to speak to his brother or sister in law - his labs are stable - potassium actually on the higher end of normal - does not need potassium or magnesium replacement - ok to try the suggestions we talked about at his visit for cramps. Otherwise, would continue on current regimen.

## 2018-11-01 ENCOUNTER — Ambulatory Visit (INDEPENDENT_AMBULATORY_CARE_PROVIDER_SITE_OTHER): Payer: Medicaid Other | Admitting: Specialist

## 2018-11-01 ENCOUNTER — Encounter (INDEPENDENT_AMBULATORY_CARE_PROVIDER_SITE_OTHER): Payer: Self-pay | Admitting: Specialist

## 2018-11-01 ENCOUNTER — Ambulatory Visit (INDEPENDENT_AMBULATORY_CARE_PROVIDER_SITE_OTHER): Payer: Medicaid Other

## 2018-11-01 VITALS — BP 120/65 | HR 58 | Ht 70.0 in | Wt 205.0 lb

## 2018-11-01 DIAGNOSIS — M79672 Pain in left foot: Secondary | ICD-10-CM

## 2018-11-01 DIAGNOSIS — M2022 Hallux rigidus, left foot: Secondary | ICD-10-CM

## 2018-11-01 DIAGNOSIS — M47816 Spondylosis without myelopathy or radiculopathy, lumbar region: Secondary | ICD-10-CM

## 2018-11-01 DIAGNOSIS — M5116 Intervertebral disc disorders with radiculopathy, lumbar region: Secondary | ICD-10-CM | POA: Diagnosis not present

## 2018-11-01 LAB — EXTRA LAV TOP TUBE

## 2018-11-01 LAB — URIC ACID: Uric Acid, Serum: 4.5 mg/dL (ref 4.0–8.0)

## 2018-11-01 MED ORDER — TRAMADOL HCL 50 MG PO TABS: 50.0000 mg | ORAL_TABLET | Freq: Four times a day (QID) | ORAL | 0 refills | Status: DC

## 2018-11-01 MED ORDER — DICLOFENAC SODIUM 50 MG PO TBEC: 50.0000 mg | DELAYED_RELEASE_TABLET | Freq: Three times a day (TID) | ORAL | 0 refills | Status: DC

## 2018-11-01 NOTE — Patient Instructions (Addendum)
Avoid frequent bending and stooping  No lifting greater than 10 lbs. May use ice or moist heat for pain. Weight loss is of benefit. You should not take medication for lumbar disc disease that are arthritis medications like motrin, celebrex and naprosyn. Tylenol or tramadol for pain. Exercise is important to improve your indurance and does allow people to function better inspite of back pain. Handicap license is approved. Dr. Romona Curls secretary/Assistant will call to arrange for epidural steroid injection  Rigid soled tennis shoes that do not flex with testing helps decrease pain due to the arthritis in the left great toe base. Uric acid level is drawn to assess for gout.

## 2018-11-01 NOTE — Progress Notes (Signed)
Office Visit Note   Patient: Andrew [name]           Date of Birth: May 06, 1951           MRN: [id number] Visit Date: 11/01/2018              Requested by: Bernerd Limbo, MD [address], [place] [address] PCP: Bernerd Limbo, MD   Assessment & Plan: Visit Diagnoses:  1. Spondylosis without myelopathy or radiculopathy, lumbar region   2. Herniation of lumbar intervertebral disc with radiculopathy   3. Hallux rigidus, left foot     Plan: Avoid frequent bending and stooping  No lifting greater than 10 lbs. May use ice or moist heat for pain. Weight loss is of benefit. You should not take medication for lumbar disc disease that are arthritis medications like motrin, celebrex and naprosyn. Tylenol or tramadol for pain. Exercise is important to improve your indurance and does allow people to function better inspite of back pain. Handicap license is approved. Dr. Romona Curls secretary/Assistant will call to arrange for epidural steroid injection  Rigid soled tennis shoes that do not flex with testing helps decrease pain due to the arthritis in the left great toe base. Uric acid level is drawn to assess for gout.  Follow-Up Instructions: Return in about 3 weeks (around 11/22/2018).   Orders:  Orders Placed This Encounter  Procedures  . XR Lumbar Spine 2-3 Views  . Uric acid  . EXTRA LAV TOP TUBE  . Ambulatory referral to Physical Medicine Rehab   Meds ordered this encounter  Medications  . diclofenac (VOLTAREN) 50 MG EC tablet    Sig: Take 1 tablet (50 mg total) by mouth 3 (three) times daily.    Dispense:  60 tablet    Refill:  0  . traMADol (ULTRAM) 50 MG tablet    Sig: Take 1 tablet (50 mg total) by mouth 4 (four) times daily.    Dispense:  30 tablet    Refill:  0      Procedures: No procedures performed   Clinical Data: No additional findings.   Subjective: Chief Complaint  Patient presents with  . Lower Back - Follow-up    67 year old  male with AM stiffness and pain into the left lower back with some radiation into the left leg and foot. He has pain with prolong standing and walking. Last seen in 09/2016 when he was found to spondylosis of the lumbar spine that responded to facet injection  Returns with increasing back pain with radiation into the left posterior leg and into the left great toe. It began about 6 months ago. He was seen by his primary care MD and MRI repeated which showed no significant change since his last study. No bowel or bladder difficulty. Pain is "5" and is severe to where it last 2-3 days.    Review of Systems   Objective: Vital Signs: BP 120/65 (BP Location: Left Arm, Patient Position: Sitting)   Pulse (!) 58   Ht 5\' 10"  (1.778 m)   Wt 205 lb (93 kg)   BMI 29.41 kg/m   Physical Exam  Constitutional: He is oriented to person, place, and time. He appears well-developed and well-nourished.  HENT:  Head: Normocephalic and atraumatic.  Eyes: Pupils are equal, round, and reactive to light. EOM are normal.  Neck: Normal range of motion. Neck supple.  Pulmonary/Chest: Effort normal and breath sounds normal.  Abdominal: Soft. Bowel sounds are normal.  Neurological: He is alert and oriented to person, place, and time.  Skin: Skin is warm and dry.  Psychiatric: He has a normal mood and affect. His behavior is normal. Judgment and thought content normal.    Left Ankle Exam   Comments:  Left foot with enlargement of the great toe MTP joint with mild erythrema, no warmth or fluctuance. The joint has decreased ROM DF 25 degrees and Plantar flexion 15 degrees. Grating with ROM. Findings consistent with hallux ridgidus. Minimal valgus at the MTP joint.    Back Exam   Tenderness  The patient is experiencing tenderness in the lumbar.  Range of Motion  Extension: abnormal  Flexion: abnormal  Lateral bend right: abnormal  Lateral bend left: abnormal  Rotation right: abnormal  Rotation left: abnormal     Muscle Strength  Right Quadriceps:  5/5  Right Hamstrings:  5/5  Left Hamstrings:  5/5   Tests  Straight leg raise right: negative Straight leg raise left: positive  Reflexes  Patellar: normal Achilles: normal Babinski's sign: abnormal   Other  Toe walk: normal Heel walk: normal Sensation: normal Gait: antalgic  Erythema: no back redness Scars: absent      Specialty Comments:  No specialty comments available.  Imaging: No results found.   PMFS History: Patient Active Problem List   Diagnosis Date Noted  . 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 Leg pain, right 10/03/2012  . Leg swelling 10/03/2012  . Right leg DVT ([place]) 10/03/2012  . HLD (hyperlipidemia) 07/25/2012  . DVT 12/24/2010   Past Medical History:  Diagnosis Date  . DVT (deep venous thrombosis) (HCC)    right leg in 2011; prior injury to right foot  . HLD (hyperlipidemia)   . Right foot injury     Family History  Problem Relation Age of Onset  . Heart disease Mother   . Heart disease Father   . Vascular Disease Maternal Grandfather     History reviewed. No pertinent surgical history. Social History   Occupational History  . Not on file  Tobacco Use  . Smoking status: Former Smoker    Types: Cigarettes    Last attempt to quit: 07/25/1982    Years since quitting: 36.3  . Smokeless tobacco: Never Used  . Tobacco comment: REMOTE SOCIAL HISTORY  Substance and Sexual Activity  . Alcohol use: No  . Drug use: No  . Sexual activity: Not Currently

## 2018-11-03 ENCOUNTER — Telehealth (INDEPENDENT_AMBULATORY_CARE_PROVIDER_SITE_OTHER): Payer: Self-pay | Admitting: Radiology

## 2018-11-03 NOTE — Telephone Encounter (Signed)
Pt is scheduled for 11/21/18 with driver.

## 2018-11-03 NOTE — Telephone Encounter (Signed)
Mary sister-in-law to patient has called to try and make appointment for brother-in-law. 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

## 2018-11-21 ENCOUNTER — Ambulatory Visit (INDEPENDENT_AMBULATORY_CARE_PROVIDER_SITE_OTHER): Payer: Self-pay

## 2018-11-21 ENCOUNTER — Ambulatory Visit (INDEPENDENT_AMBULATORY_CARE_PROVIDER_SITE_OTHER): Payer: Medicaid Other | Admitting: Physical Medicine and Rehabilitation

## 2018-11-21 ENCOUNTER — Telehealth (INDEPENDENT_AMBULATORY_CARE_PROVIDER_SITE_OTHER): Payer: Self-pay | Admitting: Specialist

## 2018-11-21 ENCOUNTER — Encounter (INDEPENDENT_AMBULATORY_CARE_PROVIDER_SITE_OTHER): Payer: Self-pay | Admitting: Physical Medicine and Rehabilitation

## 2018-11-21 VITALS — BP 123/78 | HR 72 | Temp 97.9°F

## 2018-11-21 DIAGNOSIS — M5416 Radiculopathy, lumbar region: Secondary | ICD-10-CM

## 2018-11-21 DIAGNOSIS — M47816 Spondylosis without myelopathy or radiculopathy, lumbar region: Secondary | ICD-10-CM

## 2018-11-21 MED ORDER — BETAMETHASONE SOD PHOS & ACET 6 (3-3) MG/ML IJ SUSP
12.0000 mg | Freq: Once | INTRAMUSCULAR | Status: AC
  Administered 2018-11-21: 12 mg

## 2018-11-21 NOTE — Patient Instructions (Signed)

## 2018-11-21 NOTE — Progress Notes (Signed)
 .  Numeric Pain Rating Scale and Functional Assessment Average Pain 8   In the last MONTH (on 0-10 scale) has pain interfered with the following?  1. General activity like being  able to carry out your everyday physical activities such as walking, climbing stairs, carrying groceries, or moving a chair?  Rating(6)   +Driver, +BT(Xarelto, ok for inj), -Dye Allergies.

## 2018-11-21 NOTE — Telephone Encounter (Signed)
Gerald Stabs -pharmacist from [facility] called needing pre auth for Rx (Tramadol) The number to contact Gerald Stabs is [phone]

## 2018-11-22 NOTE — Telephone Encounter (Signed)
I faxed the approval from Tramadol to [facility] in [place].  This was approved on 11/15/18

## 2018-11-24 ENCOUNTER — Other Ambulatory Visit (INDEPENDENT_AMBULATORY_CARE_PROVIDER_SITE_OTHER): Payer: Self-pay | Admitting: Specialist

## 2018-11-24 NOTE — Telephone Encounter (Signed)
Recommend holding off on taking Voltaren for now.  Just had injection a couple of days ago.

## 2018-11-24 NOTE — Telephone Encounter (Signed)
Diclofenac refill request 

## 2018-11-27 NOTE — Procedures (Signed)
Lumbar Epidural Steroid Injection - Interlaminar Approach with Fluoroscopic Guidance  Patient: Andrew [name]      Date of Birth: Jun 15, 1951 MRN: [id number] PCP: Bernerd Limbo, MD      Visit Date: 11/21/2018   Universal Protocol:     Consent Given By: the patient  Position: PRONE  Additional Comments: Vital signs were monitored before and after the procedure. Patient was prepped and draped in the usual sterile fashion. The correct patient, procedure, and site was verified.   Injection Procedure Details:  Procedure Site One Meds Administered:  Meds ordered this encounter  Medications  . betamethasone acetate-betamethasone sodium phosphate (CELESTONE) injection 12 mg     Laterality: Left  Location/Site:  L5-S1  Needle size: 20 G  Needle type: Tuohy  Needle Placement: Paramedian epidural  Findings:   -Comments: Excellent flow of contrast into the epidural space.  Procedure Details: Using a paramedian approach from the side mentioned above, the region overlying the inferior lamina was localized under fluoroscopic visualization and the soft tissues overlying this structure were infiltrated with 4 ml. of 1% Lidocaine without Epinephrine. The Tuohy needle was inserted into the epidural space using a paramedian approach.   The epidural space was localized using loss of resistance along with lateral and bi-planar fluoroscopic views.  After negative aspirate for air, blood, and CSF, a 2 ml. volume of Isovue-250 was injected into the epidural space and the flow of contrast was observed. Radiographs were obtained for documentation purposes.    The injectate was administered into the level noted above.   Additional Comments:  The patient tolerated the procedure well Dressing: Band-Aid    Post-procedure details: Patient was observed during the procedure. Post-procedure instructions were reviewed.  Patient left the clinic in stable condition.

## 2018-11-27 NOTE — Progress Notes (Signed)
[name] - 67 y.o. male MRN [id number]  Date of birth: 06/29/1951  Office Visit Note: Visit Date: 11/21/2018 PCP: Bernerd Limbo, MD Referred by: Bernerd Limbo, MD  Subjective: Chief Complaint  Patient presents with  . Lower Back - Pain  . Left Leg - Pain  . Left Foot - Pain, Tingling   HPI:  Andrew [name] is a 67 y.o. male who comes in today For planned left L4-5 facet joint injection and aspiration as well as left L5-S1 interlaminar epidural steroid injection.  This is at the request of Dr. Wandra Feinstein notes can be further reviewed.  Patient has had prior facet joint block almost 2 years ago with good relief do the facet joint cyst.  ROS Otherwise per HPI.  Assessment & Plan: Visit Diagnoses:  1. Spondylosis without myelopathy or radiculopathy, lumbar region   2. Lumbar radiculopathy     Plan: No additional findings.   Meds & Orders:  Meds ordered this encounter  Medications  . betamethasone acetate-betamethasone sodium phosphate (CELESTONE) injection 12 mg    Orders Placed This Encounter  Procedures  . Facet Injection  . XR C-ARM NO REPORT  . Epidural Steroid injection    Follow-up: Return if symptoms worsen or fail to improve, for Basil Dess, MD.   Procedures: No procedures performed  Lumbar Facet Joint Intra-Articular Injection(s) with Fluoroscopic Guidance  Patient: [name]      Date of Birth: December 11, 1951 MRN: [id number] PCP: Bernerd Limbo, MD      Visit Date: 11/21/2018   Universal Protocol:    Date/Time: 11/21/2018  Consent Given By: the patient  Position: PRONE   Additional Comments: Vital signs were monitored before and after the procedure. Patient was prepped and draped in the usual sterile fashion. The correct patient, procedure, and site was verified.   Injection Procedure Details:  Procedure Site One Meds Administered:  Meds ordered this encounter  Medications  . betamethasone acetate-betamethasone sodium phosphate  (CELESTONE) injection 12 mg     Laterality: Left  Location/Site:  L4-L5  Needle size: 22 guage  Needle type: Spinal  Needle Placement: Articular  Findings:  -Comments: Excellent flow of contrast producing a partial arthrogram.  Aspiration attempted but no fluid obtained  Procedure Details: The fluoroscope beam is vertically oriented in AP, and the inferior recess is visualized beneath the lower pole of the inferior apophyseal process, which represents the target point for needle insertion. When direct visualization is difficult the target point is located at the medial projection of the vertebral pedicle. The region overlying each aforementioned target is locally anesthetized with a 1 to 2 ml. volume of 1% Lidocaine without Epinephrine.   The spinal needle was inserted into each of the above mentioned facet joints using biplanar fluoroscopic guidance. A 0.25 to 0.5 ml. volume of Isovue-250 was injected and a partial facet joint arthrogram was obtained. A single spot film was obtained of the resulting arthrogram.    One to 1.25 ml of the steroid/anesthetic solution was then injected into each of the facet joints noted above.   Additional Comments:  The patient tolerated the procedure well Dressing: Band-Aid    Post-procedure details: Patient was observed during the procedure. Post-procedure instructions were reviewed.  Patient left the clinic in stable condition.   Lumbar Epidural Steroid Injection - Interlaminar Approach with Fluoroscopic Guidance  Patient: [name]      Date of Birth: [birth year] MRN: [id number] PCP: Bernerd Limbo, MD      Visit Date: 11/21/2018  Universal Protocol:     Consent Given By: the patient  Position: PRONE  Additional Comments: Vital signs were monitored before and after the procedure. Patient was prepped and draped in the usual sterile fashion. The correct patient, procedure, and site was verified.   Injection Procedure Details:    Procedure Site One Meds Administered:  Meds ordered this encounter  Medications  . betamethasone acetate-betamethasone sodium phosphate (CELESTONE) injection 12 mg     Laterality: Left  Location/Site:  L5-S1  Needle size: 20 G  Needle type: Tuohy  Needle Placement: Paramedian epidural  Findings:   -Comments: Excellent flow of contrast into the epidural space.  Procedure Details: Using a paramedian approach from the side mentioned above, the region overlying the inferior lamina was localized under fluoroscopic visualization and the soft tissues overlying this structure were infiltrated with 4 ml. of 1% Lidocaine without Epinephrine. The Tuohy needle was inserted into the epidural space using a paramedian approach.   The epidural space was localized using loss of resistance along with lateral and bi-planar fluoroscopic views.  After negative aspirate for air, blood, and CSF, a 2 ml. volume of Isovue-250 was injected into the epidural space and the flow of contrast was observed. Radiographs were obtained for documentation purposes.    The injectate was administered into the level noted above.   Additional Comments:  The patient tolerated the procedure well Dressing: Band-Aid    Post-procedure details: Patient was observed during the procedure. Post-procedure instructions were reviewed.  Patient left the clinic in stable condition.   Clinical History: No specialty comments available.     Objective:  VS:  HT:    WT:   BMI:     BP:123/78  HR:72bpm  TEMP:97.9 F (36.6 C)(Oral)  RESP:  Physical Exam  Ortho Exam Imaging: No results found.

## 2018-11-27 NOTE — Procedures (Signed)
Lumbar Facet Joint Intra-Articular Injection(s) with Fluoroscopic Guidance  Patient: Andrew [name]      Date of Birth: 23-Dec-1950 MRN: [id number] PCP: Bernerd Limbo, MD      Visit Date: 11/21/2018   Universal Protocol:    Date/Time: 11/21/2018  Consent Given By: the patient  Position: PRONE   Additional Comments: Vital signs were monitored before and after the procedure. Patient was prepped and draped in the usual sterile fashion. The correct patient, procedure, and site was verified.   Injection Procedure Details:  Procedure Site One Meds Administered:  Meds ordered this encounter  Medications  . betamethasone acetate-betamethasone sodium phosphate (CELESTONE) injection 12 mg     Laterality: Left  Location/Site:  L4-L5  Needle size: 22 guage  Needle type: Spinal  Needle Placement: Articular  Findings:  -Comments: Excellent flow of contrast producing a partial arthrogram.  Aspiration attempted but no fluid obtained  Procedure Details: The fluoroscope beam is vertically oriented in AP, and the inferior recess is visualized beneath the lower pole of the inferior apophyseal process, which represents the target point for needle insertion. When direct visualization is difficult the target point is located at the medial projection of the vertebral pedicle. The region overlying each aforementioned target is locally anesthetized with a 1 to 2 ml. volume of 1% Lidocaine without Epinephrine.   The spinal needle was inserted into each of the above mentioned facet joints using biplanar fluoroscopic guidance. A 0.25 to 0.5 ml. volume of Isovue-250 was injected and a partial facet joint arthrogram was obtained. A single spot film was obtained of the resulting arthrogram.    One to 1.25 ml of the steroid/anesthetic solution was then injected into each of the facet joints noted above.   Additional Comments:  The patient tolerated the procedure well Dressing: Band-Aid     Post-procedure details: Patient was observed during the procedure. Post-procedure instructions were reviewed.  Patient left the clinic in stable condition.

## 2018-11-29 ENCOUNTER — Ambulatory Visit (INDEPENDENT_AMBULATORY_CARE_PROVIDER_SITE_OTHER): Payer: Medicaid Other | Admitting: Specialist

## 2018-11-29 ENCOUNTER — Encounter (INDEPENDENT_AMBULATORY_CARE_PROVIDER_SITE_OTHER): Payer: Self-pay | Admitting: Specialist

## 2018-11-29 VITALS — BP 111/69 | HR 59 | Ht 70.0 in | Wt 205.0 lb

## 2018-11-29 DIAGNOSIS — M4726 Other spondylosis with radiculopathy, lumbar region: Secondary | ICD-10-CM

## 2018-11-29 DIAGNOSIS — M47816 Spondylosis without myelopathy or radiculopathy, lumbar region: Secondary | ICD-10-CM | POA: Diagnosis not present

## 2018-11-29 DIAGNOSIS — M5116 Intervertebral disc disorders with radiculopathy, lumbar region: Secondary | ICD-10-CM

## 2018-11-29 DIAGNOSIS — M5126 Other intervertebral disc displacement, lumbar region: Secondary | ICD-10-CM | POA: Diagnosis not present

## 2018-11-29 DIAGNOSIS — M79672 Pain in left foot: Secondary | ICD-10-CM

## 2018-11-29 MED ORDER — TRAMADOL HCL 50 MG PO TABS: 50.0000 mg | ORAL_TABLET | Freq: Four times a day (QID) | ORAL | 0 refills | Status: DC

## 2018-11-29 NOTE — Patient Instructions (Signed)
Avoid frequent bending and stooping  No lifting greater than 10 lbs. May use ice or moist heat for pain. Weight loss is of benefit. Do not take NSAIDs while on xarelto due to risk of bleeding complications.  Exercise is important to improve your indurance and does allow people to function better inspite of back pain.

## 2018-11-29 NOTE — Progress Notes (Signed)
Office Visit Note   Patient: Andrew [name]           Date of Birth: 08-24-1951           MRN: [id number] Visit Date: 11/29/2018              Requested by: Bernerd Limbo, MD [address] [place], [place] [address] PCP: Bernerd Limbo, MD   Assessment & Plan: Visit Diagnoses:  1. Other spondylosis with radiculopathy, lumbar region   2. Lumbar disc herniation   3. Spondylosis without myelopathy or radiculopathy, lumbar region   4. Herniation of lumbar intervertebral disc with radiculopathy   5. Pain in left foot     Plan: Avoid frequent bending and stooping  No lifting greater than 10 lbs. May use ice or moist heat for pain. Weight loss is of benefit. Do not take NSAIDs while on xarelto due to risk of bleeding complications.  Exercise is important to improve your indurance and does allow people to function better inspite of back pain.  Follow-Up Instructions: Return if symptoms worsen or fail to improve.   Orders:  No orders of the defined types were placed in this encounter.  Meds ordered this encounter  Medications  . traMADol (ULTRAM) 50 MG tablet    Sig: Take 1 tablet (50 mg total) by mouth 4 (four) times daily.    Dispense:  30 tablet    Refill:  0      Procedures: No procedures performed   Clinical Data: No additional findings.   Subjective: Chief Complaint  Patient presents with  . Lower Back - Follow-up    He had a Left L5-S1 IL injection with Dr. Ernestina Patches on 11/21/18.  He states his pain was a 10 before injection and now he is at 2 or 3.  He got 70-80% relief.    67 year old   Review of Systems  Constitutional: Negative.  Negative for activity change, appetite change, chills, diaphoresis, fatigue, fever and unexpected weight change.  HENT: Negative.   Eyes: Negative.   Respiratory: Negative.   Cardiovascular: Negative.   Gastrointestinal: Negative.   Endocrine: Negative.   Genitourinary: Negative.   Musculoskeletal: Negative.    Skin: Negative.   Allergic/Immunologic: Negative.   Neurological: Negative.   Hematological: Negative.   Psychiatric/Behavioral: Negative.      Objective: Vital Signs: BP 111/69 (BP Location: Left Arm, Patient Position: Sitting)   Pulse (!) 59   Ht 5\' 10"  (1.778 m)   Wt 205 lb (93 kg)   BMI 29.41 kg/m   Physical Exam Constitutional:      Appearance: He is well-developed.  HENT:     Head: Normocephalic and atraumatic.  Eyes:     Pupils: Pupils are equal, round, and reactive to light.  Neck:     Musculoskeletal: Normal range of motion and neck supple.  Pulmonary:     Effort: Pulmonary effort is normal.     Breath sounds: Normal breath sounds.  Abdominal:     General: Bowel sounds are normal.     Palpations: Abdomen is soft.  Skin:    General: Skin is warm and dry.  Neurological:     Mental Status: He is alert and oriented to person, place, and time.  Psychiatric:        Behavior: Behavior normal.        Thought Content: Thought content normal.        Judgment: Judgment normal.     Back Exam  Tenderness  The patient is experiencing tenderness in the lumbar.  Range of Motion  Extension: abnormal  Flexion: abnormal  Lateral bend right: normal  Lateral bend left: normal  Rotation right: normal  Rotation left: normal   Muscle Strength  The patient has normal back strength.  Tests  Straight leg raise right: negative Straight leg raise left: negative  Reflexes  Patellar: normal  Other  Toe walk: normal Heel walk: normal Sensation: normal Gait: normal  Scars: absent      Specialty Comments:  No specialty comments available.  Imaging: No results found.   PMFS History: Patient Active Problem List   Diagnosis Date Noted  . 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 Leg pain, right 10/03/2012  . Leg swelling 10/03/2012  . Right leg DVT ([place]) 10/03/2012  . HLD (hyperlipidemia) 07/25/2012  . DVT  12/24/2010   Past Medical History:  Diagnosis Date  . DVT (deep venous thrombosis) (HCC)    right leg in 2011; prior injury to right foot  . HLD (hyperlipidemia)   . Right foot injury     Family History  Problem Relation Age of Onset  . Heart disease Mother   . Heart disease Father   . Vascular Disease Maternal Grandfather     History reviewed. No pertinent surgical history. Social History   Occupational History  . Not on file  Tobacco Use  . Smoking status: Former Smoker    Types: Cigarettes    Last attempt to quit: 07/25/1982    Years since quitting: 36.3  . Smokeless tobacco: Never Used  . Tobacco comment: REMOTE SOCIAL HISTORY  Substance and Sexual Activity  . Alcohol use: No  . Drug use: No  . Sexual activity: Not Currently

## 2018-12-04 ENCOUNTER — Other Ambulatory Visit (INDEPENDENT_AMBULATORY_CARE_PROVIDER_SITE_OTHER): Payer: Self-pay | Admitting: Radiology

## 2018-12-04 ENCOUNTER — Other Ambulatory Visit (INDEPENDENT_AMBULATORY_CARE_PROVIDER_SITE_OTHER): Payer: Self-pay | Admitting: Specialist

## 2019-02-17 ENCOUNTER — Other Ambulatory Visit: Payer: Self-pay | Admitting: Cardiovascular Disease

## 2019-02-19 ENCOUNTER — Ambulatory Visit: Payer: Medicaid Other | Admitting: Cardiovascular Disease

## 2019-05-04 ENCOUNTER — Encounter: Payer: Self-pay | Admitting: Specialist

## 2019-05-04 ENCOUNTER — Ambulatory Visit: Payer: Medicaid Other | Admitting: Specialist

## 2019-05-04 ENCOUNTER — Ambulatory Visit: Payer: Self-pay

## 2019-05-04 ENCOUNTER — Other Ambulatory Visit: Payer: Self-pay

## 2019-05-04 VITALS — BP 102/65 | HR 58 | Ht 70.0 in | Wt 205.0 lb

## 2019-05-04 DIAGNOSIS — M4726 Other spondylosis with radiculopathy, lumbar region: Secondary | ICD-10-CM | POA: Diagnosis not present

## 2019-05-04 NOTE — Progress Notes (Signed)
Office Visit Note   Patient: Andrew [name]           Date of Birth: 11/02/1951           MRN: [id number] Visit Date: 05/04/2019              Requested by: Bernerd Limbo, MD [address] [place], [place] [id number] PCP: Bernerd Limbo, MD   Assessment & Plan: Visit Diagnoses:  1. Other spondylosis with radiculopathy, lumbar region     Plan: Since patient had very good relief of his current symptoms with injections that were done December 2019 I will schedule him to have repeat left L4-5 facet and left L5-S1 interlaminar injections with Dr. Ernestina Patches.  Return office visit with Dr. Louanne Skye 2 weeks after he has had injections to check his response.  All questions answered.  Follow-Up Instructions: Return for needs ROV 2 weeks after Lumbar ESI's with Dr Louanne Skye.   Orders:  Orders Placed This Encounter  Procedures  . XR Lumbar Spine 2-3 Views  . Ambulatory referral to Physical Medicine Rehab   No orders of the defined types were placed in this encounter.     Procedures: No procedures performed   Clinical Data: No additional findings.   Subjective: Chief Complaint  Patient presents with  . Lower Back - Follow-up    HPI 68 year old white male returns with complaints of low back pain and left lower extremity radiculopathy to his foot.  Patient last seen by Dr. Louanne Skye November 29, 2018 about a week after he had left L4-5 facet and left L5-S1 interlaminar injections.  States that he had excellent relief up until about a week ago.  Again has pain in the low back with radiation down the left leg to his foot.  States that leg pain is currently worse than the back pain.  Denies leg weakness.  No right leg symptoms.  Symptoms aggravated when he is ambulating, bending, twisting.  No new injury.  Patient is unable to take oral NSAIDs due to him being on chronic Xarelto. Review of Systems No current cardiac pulmonary GI GU issues  Objective: Vital Signs: BP 102/65 (BP  Location: Left Arm, Patient Position: Sitting)   Pulse (!) 58   Ht 5\' 10"  (1.778 m)   Wt 205 lb (93 kg)   BMI 29.41 kg/m   Physical Exam HENT:     Head: Normocephalic.  Eyes:     Extraocular Movements: Extraocular movements intact.     Pupils: Pupils are equal, round, and reactive to light.  Pulmonary:     Effort: No respiratory distress.  Musculoskeletal:     Comments: Gait is somewhat antalgic.  Left-sided lumbar paraspinal tenderness.  Negative logroll.  Negative straight leg raise.  Neurological:     General: No focal deficit present.     Mental Status: He is alert and oriented to person, place, and time.  Psychiatric:        Mood and Affect: Mood normal.     Ortho Exam  Specialty Comments:  No specialty comments available.  Imaging: No results found.   PMFS History: Patient Active Problem List   Diagnosis Date Noted  . 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 Leg pain, right 10/03/2012  . Leg swelling 10/03/2012  . Right leg DVT ([facility]) 10/03/2012  . HLD (hyperlipidemia) 07/25/2012  . DVT 12/24/2010   Past Medical History:  Diagnosis Date  . DVT (deep venous thrombosis) ([facility])    right  leg in 2011; prior injury to right foot  . HLD (hyperlipidemia)   . Right foot injury     Family History  Problem Relation Age of Onset  . Heart disease Mother   . Heart disease Father   . Vascular Disease Maternal Grandfather     History reviewed. No pertinent surgical history. Social History   Occupational History  . Not on file  Tobacco Use  . Smoking status: Former Smoker    Types: Cigarettes    Last attempt to quit: 07/25/1982    Years since quitting: 36.8  . Smokeless tobacco: Never Used  . Tobacco comment: REMOTE SOCIAL HISTORY  Substance and Sexual Activity  . Alcohol use: No  . Drug use: No  . Sexual activity: Not Currently

## 2019-05-07 ENCOUNTER — Other Ambulatory Visit (INDEPENDENT_AMBULATORY_CARE_PROVIDER_SITE_OTHER): Payer: Self-pay | Admitting: Specialist

## 2019-05-07 ENCOUNTER — Encounter: Payer: Self-pay | Admitting: Specialist

## 2019-05-07 DIAGNOSIS — M5116 Intervertebral disc disorders with radiculopathy, lumbar region: Secondary | ICD-10-CM

## 2019-05-07 DIAGNOSIS — M79672 Pain in left foot: Secondary | ICD-10-CM

## 2019-05-07 DIAGNOSIS — M47816 Spondylosis without myelopathy or radiculopathy, lumbar region: Secondary | ICD-10-CM

## 2019-05-08 NOTE — Telephone Encounter (Signed)
JN patient 

## 2019-05-08 NOTE — Telephone Encounter (Signed)
Tramadol refill request 

## 2019-05-09 ENCOUNTER — Other Ambulatory Visit (INDEPENDENT_AMBULATORY_CARE_PROVIDER_SITE_OTHER): Payer: Self-pay | Admitting: Specialist

## 2019-05-09 DIAGNOSIS — M5116 Intervertebral disc disorders with radiculopathy, lumbar region: Secondary | ICD-10-CM

## 2019-05-09 DIAGNOSIS — M47816 Spondylosis without myelopathy or radiculopathy, lumbar region: Secondary | ICD-10-CM

## 2019-05-09 DIAGNOSIS — M79672 Pain in left foot: Secondary | ICD-10-CM

## 2019-05-09 MED ORDER — TRAMADOL HCL 50 MG PO TABS: 50.0000 mg | ORAL_TABLET | Freq: Four times a day (QID) | ORAL | 0 refills | Status: DC

## 2019-05-10 ENCOUNTER — Telehealth: Payer: Self-pay | Admitting: Cardiovascular Disease

## 2019-05-10 ENCOUNTER — Telehealth: Payer: Self-pay

## 2019-05-10 ENCOUNTER — Telehealth: Payer: Self-pay | Admitting: Physical Medicine and Rehabilitation

## 2019-05-10 NOTE — Telephone Encounter (Signed)
Patient brother [name] who is patients advocate due to patient having limited English called stating that patient is in a lot of pain and saw Jeneen Rinks last week and has not heard anything about getting an appointment with Dr Ernestina Patches. Can you please call them to advise. Thanks  [phone]

## 2019-05-10 NOTE — Telephone Encounter (Signed)
Yes- this is fine; thanks- 

## 2019-05-10 NOTE — Telephone Encounter (Signed)
Scheduled for 6/16. Patient does take Xarelto. I will send a message to Dr. Burt Knack to get ok to hold and notify patient's advocate.

## 2019-05-10 NOTE — Telephone Encounter (Signed)
Patient set up for MyChart? YES CONSENT SENT THROUGH MYCHART   Is patient using Smartphone/computer/tablet SMARTPHONE  Did audio/video work?   Does patient need telephone visit? NO   Best phone number to use? [phone]  Special Instructions? PATIENT WILL HAVE WT AND BP      Virtual Visit Pre-Appointment Phone Call  "(Name), I am calling you today to discuss your upcoming appointment. We are currently trying to limit exposure to the virus that causes COVID-19 by seeing patients at home rather than in the office."  1. "What is the BEST phone number to call the day of the visit?" - include this in appointment notes  2. Do you have or have access to (through a family member/friend) a smartphone with video capability that we can use for your visit?" a. If yes - list this number in appt notes as cell (if different from BEST phone #) and list the appointment type as a VIDEO visit in appointment notes b. If no - list the appointment type as a PHONE visit in appointment notes  3. Confirm consent - "In the setting of the current Covid19 crisis, you are scheduled for a (phone or video) visit with your provider on (date) at (time).  Just as we do with many in-office visits, in order for you to participate in this visit, we must obtain consent.  If you'd like, I can send this to your mychart (if signed up) or email for you to review.  Otherwise, I can obtain your verbal consent now.  All virtual visits are billed to your insurance company just like a normal visit would be.  By agreeing to a virtual visit, we'd like you to understand that the technology does not allow for your provider to perform an examination, and thus may limit your provider's ability to fully assess your condition. If your provider identifies any concerns that need to be evaluated in person, we will make arrangements to do so.  Finally, though the technology is pretty good, we cannot assure that it will always work on either your  or our end, and in the setting of a video visit, we may have to convert it to a phone-only visit.  In either situation, we cannot ensure that we have a secure connection.  Are you willing to proceed?" STAFF: Did the patient verbally acknowledge consent to telehealth visit? Document YES/NO here: YES  4. Advise patient to be prepared - "Two hours prior to your appointment, go ahead and check your blood pressure, pulse, oxygen saturation, and your weight (if you have the equipment to check those) and write them all down. When your visit starts, your provider will ask you for this information. If you have an Apple Watch or Kardia device, please plan to have heart rate information ready on the day of your appointment. Please have a pen and paper handy nearby the day of the visit as well."  5. Give patient instructions for MyChart download to smartphone OR Doximity/Doxy.me as below if video visit (depending on what platform provider is using)  6. Inform patient they will receive a phone call 15 minutes prior to their appointment time (may be from unknown caller ID) so they should be prepared to answer    TELEPHONE CALL NOTE  Andrew [name] has been deemed a candidate for a follow-up tele-health visit to limit community exposure during the Covid-19 pandemic. I spoke with the patient via phone to ensure availability of phone/video source, confirm preferred email & phone number,  and discuss instructions and expectations.  I reminded Andrew [name] to be prepared with any vital sign and/or heart rhythm information that could potentially be obtained via home monitoring, at the time of his visit. I reminded Andrew [name] to expect a phone call prior to his visit.  Andrew [name] 05/10/2019 2:39 PM   INSTRUCTIONS FOR DOWNLOADING THE MYCHART APP TO SMARTPHONE  - The patient must first make sure to have activated MyChart and know their login information - If Apple, go to [employer] and type in MyChart in the  search bar and download the app. If Android, ask patient to go to [employer] and type in [place] in the search bar and download the app. The app is free but as with any other app downloads, their phone may require them to verify saved payment information or Apple/Android password.  - The patient will need to then log into the app with their MyChart username and password, and select [facility] as their healthcare provider to link the account. When it is time for your visit, go to the MyChart app, find appointments, and click Begin Video Visit. Be sure to Select Allow for your device to access the Microphone and Camera for your visit. You will then be connected, and your provider will be with you shortly.  **If they have any issues connecting, or need assistance please contact MyChart service desk [phone])**  **If using a computer, in order to ensure the best quality for their visit they will need to use either of the following Internet Browsers: [employer], or Google Chrome**  IF USING DOXIMITY or DOXY.ME - The patient will receive a link just prior to their visit by text.     FULL LENGTH CONSENT FOR TELE-HEALTH VISIT   I hereby voluntarily request, consent and authorize [facility] and its employed or contracted physicians, physician assistants, nurse practitioners or other licensed health care professionals (the Practitioner), to provide me with telemedicine health care services (the Services") as deemed necessary by the treating Practitioner. I acknowledge and consent to receive the Services by the Practitioner via telemedicine. I understand that the telemedicine visit will involve communicating with the Practitioner through live audiovisual communication technology and the disclosure of certain medical information by electronic transmission. I acknowledge that I have been given the opportunity to request an in-person assessment or other available alternative prior  to the telemedicine visit and am voluntarily participating in the telemedicine visit.  I understand that I have the right to withhold or withdraw my consent to the use of telemedicine in the course of my care at any time, without affecting my right to future care or treatment, and that the Practitioner or I may terminate the telemedicine visit at any time. I understand that I have the right to inspect all information obtained and/or recorded in the course of the telemedicine visit and may receive copies of available information for a reasonable fee.  I understand that some of the potential risks of receiving the Services via telemedicine include:   Delay or interruption in medical evaluation due to technological equipment failure or disruption;  Information transmitted may not be sufficient (e.g. poor resolution of images) to allow for appropriate medical decision making by the Practitioner; and/or   In rare instances, security protocols could fail, causing a breach of personal health information.  Furthermore, I acknowledge that it is my responsibility to provide information about my medical history, conditions and care that is complete and accurate  to the best of my ability. I acknowledge that Practitioner's advice, recommendations, and/or decision may be based on factors not within their control, such as incomplete or inaccurate data provided by me or distortions of diagnostic images or specimens that may result from electronic transmissions. I understand that the practice of medicine is not an exact science and that Practitioner makes no warranties or guarantees regarding treatment outcomes. I acknowledge that I will receive a copy of this consent concurrently upon execution via email to the email address I last provided but may also request a printed copy by calling the office of [facility].    I understand that my insurance will be billed for this visit.   I have read or had this consent read  to me.  I understand the contents of this consent, which adequately explains the benefits and risks of the Services being provided via telemedicine.   I have been provided ample opportunity to ask questions regarding this consent and the Services and have had my questions answered to my satisfaction.  I give my informed consent for the services to be provided through the use of telemedicine in my medical care  By participating in this telemedicine visit I agree to the above.

## 2019-05-11 ENCOUNTER — Encounter: Payer: Self-pay | Admitting: Cardiovascular Disease

## 2019-05-11 ENCOUNTER — Telehealth (INDEPENDENT_AMBULATORY_CARE_PROVIDER_SITE_OTHER): Payer: Medicaid Other | Admitting: Cardiovascular Disease

## 2019-05-11 ENCOUNTER — Other Ambulatory Visit: Payer: Self-pay

## 2019-05-11 VITALS — BP 116/68 | HR 67 | Ht 70.0 in | Wt 189.0 lb

## 2019-05-11 DIAGNOSIS — I82409 Acute embolism and thrombosis of unspecified deep veins of unspecified lower extremity: Secondary | ICD-10-CM | POA: Diagnosis not present

## 2019-05-11 MED ORDER — RIVAROXABAN 10 MG PO TABS: 10.0000 mg | ORAL_TABLET | Freq: Every day | ORAL | 3 refills | Status: DC

## 2019-05-11 NOTE — Progress Notes (Signed)
Virtual Visit via Video Note   This visit type was conducted due to national recommendations for restrictions regarding the COVID-19 Pandemic (e.g. social distancing) in an effort to limit this patient's exposure and mitigate transmission in our community.  Due to his co-morbid illnesses, this patient is at least at moderate risk for complications without adequate follow up.  This format is felt to be most appropriate for this patient at this time.  All issues noted in this document were discussed and addressed.  A limited physical exam was performed with this format.  Please refer to the patient's chart for his consent to telehealth for [facility].   Date:  05/11/2019   ID:  Andrew [name], DOB [birth year], MRN [id number]  Patient Location: Home Provider Location: Home  PCP:  Bernerd Limbo, MD  Cardiologist:  No primary care provider on file.  Electrophysiologist:  None   Evaluation Performed:  Follow-Up Visit  Chief Complaint:  Leg swelling  History of Present Illness:    Andrew [name] is a 68 y.o. male with a history of DVT with chronic leg edema, maintained on long-term oral anticoagulation.  He was last seen by Truitt Merle in November 2019.  At that time he was felt to be stable.  He is evaluated today via [facility] in light of the current COVID-19 pandemic.  He reports no cardiac symptoms whatsoever.  He specifically denies chest pain, chest pressure, shortness of breath, orthopnea, heart palpitations, or PND.  He is had no lightheadedness or syncope.  His chronic leg swelling is unchanged and not problematic for him at present.  He is walking as much as he can but he is limited by lumbar problems with pain into his left leg.  He has been followed by Dr. Louanne Skye and is scheduled for a spinal injection in the near future.  The patient does not have symptoms concerning for COVID-19 infection (fever, chills, cough, or new shortness of breath).    Past Medical  History:  Diagnosis Date  . DVT (deep venous thrombosis) ([facility])    right leg in 2011; prior injury to right foot  . HLD (hyperlipidemia)   . Right foot injury    No past surgical history on file.   Current Meds  Medication Sig  . atorvastatin (LIPITOR) 40 MG tablet Take 1 tablet (40 mg total) by mouth daily at 6 PM.  . HYDROcodone-acetaminophen (NORCO/VICODIN) 5-325 MG tablet TAKE 1 TABLET BY MOUTH EVERY 4 HRS AS NEEDED FOR PAIN  . rivaroxaban (XARELTO) 10 MG TABS tablet Take 1 tablet (10 mg total) by mouth daily.  Marland Kitchen tiZANidine (ZANAFLEX) 4 MG tablet Take 1 tablet by mouth as needed.  . traMADol (ULTRAM) 50 MG tablet Take 50 mg by mouth every 6 (six) hours as needed.   Current Facility-Administered Medications for the 05/11/19 encounter (Telemedicine) with Sherren Mocha, MD  Medication  . bupivacaine (MARCAINE) 0.25 % (with pres) injection 2 mL     Allergies:   Penicillins and Influenza vaccines   Social History   Tobacco Use  . Smoking status: Former Smoker    Types: Cigarettes    Last attempt to quit: 07/25/1982    Years since quitting: 36.8  . Smokeless tobacco: Never Used  . Tobacco comment: REMOTE SOCIAL HISTORY  Substance Use Topics  . Alcohol use: No  . Drug use: No     Family Hx: The patient's family history includes Heart disease in his father and mother; Vascular Disease in his maternal  grandfather.  ROS:   Please see the history of present illness.    All other systems reviewed and are negative.   Prior CV studies:   The following studies were reviewed today:  Stress echocardiogram 04/05/2018: Stress results:   Maximal heart rate during stress was 137 bpm (90% of maximal predicted heart rate). The maximal predicted heart rate was 153 bpm.The target heart rate was achieved. The heart rate response to stress was normal. There was a normal resting blood pressure with an appropriate response to stress. The rate-pressure product for the peak heart rate and  blood pressure was 23607 mm Hg/min.  The patient experienced no chest pain during stress.  ------------------------------------------------------------------- Stress ECG:  There were no ST or T wave changes to suggest ischemia .  ------------------------------------------------------------------- Baseline:  - LV global systolic function was normal. - Normal wall motion; no LV regional wall motion abnormalities.  Peak stress:  - LV global systolic function was vigorous. - Normal wall motion; no LV regional wall motion abnormalities. - No evidence for new LV regional wall motion abnormalities  Labs/Other Tests and Data Reviewed:    EKG:  No ECG reviewed.  Recent Labs: 10/25/2018: BUN 16; Creatinine, Ser 1.07; Hemoglobin 14.6; Magnesium 2.0; Platelets 276; Potassium 5.1; Sodium 142   Recent Lipid Panel Lab Results  Component Value Date/Time   CHOL 170 04/08/2014 10:06 AM   TRIG 79.0 04/08/2014 10:06 AM   HDL 57.40 04/08/2014 10:06 AM   CHOLHDL 3 04/08/2014 10:06 AM   LDLCALC 97 04/08/2014 10:06 AM    Wt Readings from Last 3 Encounters:  05/11/19 189 lb (85.7 kg)  05/04/19 205 lb (93 kg)  11/29/18 205 lb (93 kg)     Objective:    Vital Signs:  BP 116/68 (BP Location: Left Arm, Patient Position: Sitting, Cuff Size: Normal)   Pulse 67   Ht 5\' 10"  (1.778 m)   Wt 189 lb (85.7 kg)   BMI 27.12 kg/m    VITAL SIGNS:  reviewed The patient is alert, oriented, in no distress.  He is breathing comfortably during normal conversation.  Remaining exam not performed secondary to telehealth visit type.  ASSESSMENT & PLAN:    1. Chronic DVT: The patient remains on low-dose Xarelto without any recurrent issues.  Seems to be tolerating this quite well.  He requests a refill today.  He will hold Xarelto for upcoming spinal injection as planned and he has been instructed on this.  I will plan to see the patient back in 1 year for follow-up evaluation.  COVID-19 Education: The  signs and symptoms of COVID-19 were discussed with the patient and how to seek care for testing (follow up with PCP or arrange E-visit). The importance of social distancing was discussed today.  Time:   Today, I have spent 12 minutes with the patient with telehealth technology discussing the above problems.     Medication Adjustments/Labs and Tests Ordered: Current medicines are reviewed at length with the patient today.  Concerns regarding medicines are outlined above.   Tests Ordered: No orders of the defined types were placed in this encounter.   Medication Changes: No orders of the defined types were placed in this encounter.   Disposition:  Follow up in 1 year(s)  Signed, Sherren Mocha, MD  05/11/2019 11:40 AM    [place]

## 2019-05-12 ENCOUNTER — Telehealth: Payer: Self-pay | Admitting: Adult Health

## 2019-05-12 NOTE — Telephone Encounter (Signed)
[facility]t voicemail for patient to return call regarding recent visit with Dr. Louanne Skye.

## 2019-05-17 NOTE — Telephone Encounter (Signed)
Patient called, left VM to return call to [phone] between the hours 0700-1900 Monday through Friday to speak to a nurse about OV at [facility]. Attempted to advised of the potential exposure to COVID-19 at [place] on 05/04/19 at [facility] and to offer testing.

## 2019-05-18 ENCOUNTER — Other Ambulatory Visit (INDEPENDENT_AMBULATORY_CARE_PROVIDER_SITE_OTHER): Payer: Self-pay | Admitting: Specialist

## 2019-05-18 ENCOUNTER — Telehealth: Payer: Self-pay | Admitting: Specialist

## 2019-05-18 MED ORDER — HYDROCODONE-ACETAMINOPHEN 7.5-325 MG PO TABS: 1.0000 | ORAL_TABLET | Freq: Four times a day (QID) | ORAL | 0 refills | Status: DC | PRN

## 2019-05-18 NOTE — Telephone Encounter (Signed)
I will not fill a prescription for oxycodone for a problem over 3 months old. If the pain is that bad then maybe we need to consider surgery. Hydrocodone sent to his pharmacy. No oxycodone. Epidural steroid injection has been ordered. jen

## 2019-05-18 NOTE — Telephone Encounter (Signed)
Per pts brother tramadol is not helping, they are requesting Oxycodone 5-325

## 2019-05-18 NOTE — Telephone Encounter (Signed)
I called and advised Laqueta Due of message below from Dr. Louanne Skye

## 2019-05-29 ENCOUNTER — Ambulatory Visit (INDEPENDENT_AMBULATORY_CARE_PROVIDER_SITE_OTHER): Payer: Medicaid Other | Admitting: Physical Medicine and Rehabilitation

## 2019-05-29 ENCOUNTER — Other Ambulatory Visit: Payer: Self-pay

## 2019-05-29 ENCOUNTER — Encounter

## 2019-05-29 ENCOUNTER — Encounter: Payer: Self-pay | Admitting: Physical Medicine and Rehabilitation

## 2019-05-29 ENCOUNTER — Ambulatory Visit: Payer: Self-pay

## 2019-05-29 VITALS — BP 130/79 | HR 58

## 2019-05-29 DIAGNOSIS — M47816 Spondylosis without myelopathy or radiculopathy, lumbar region: Secondary | ICD-10-CM

## 2019-05-29 DIAGNOSIS — M5416 Radiculopathy, lumbar region: Secondary | ICD-10-CM

## 2019-05-29 MED ORDER — METHYLPREDNISOLONE ACETATE 80 MG/ML IJ SUSP
40.0000 mg | Freq: Once | INTRAMUSCULAR | Status: AC
  Administered 2019-05-29: 40 mg

## 2019-05-29 MED ORDER — BETAMETHASONE SOD PHOS & ACET 6 (3-3) MG/ML IJ SUSP
6.0000 mg | Freq: Once | INTRAMUSCULAR | Status: AC
  Administered 2019-05-29: 6 mg

## 2019-05-29 NOTE — Progress Notes (Signed)
.  Numeric Pain Rating Scale and Functional Assessment Average Pain 8   In the last MONTH (on 0-10 scale) has pain interfered with the following?  1. General activity like being  able to carry out your everyday physical activities such as walking, climbing stairs, carrying groceries, or moving a chair?  Rating(6)   +Driver, +BT(xarelto, stopped 6/13/2020_, -Dye Allergies.

## 2019-07-13 NOTE — Procedures (Signed)
Lumbar Facet Joint Intra-Articular Injection(s) with Fluoroscopic Guidance  Patient: Andrew [name]      Date of Birth: 10-09-1951 MRN: [id number] PCP: Bernerd Limbo, MD      Visit Date: 05/29/2019   Universal Protocol:    Date/Time: 05/29/2019  Consent Given By: the patient  Position: PRONE   Additional Comments: Vital signs were monitored before and after the procedure. Patient was prepped and draped in the usual sterile fashion. The correct patient, procedure, and site was verified.   Injection Procedure Details:  Procedure Site One Meds Administered:  Meds ordered this encounter  Medications  . methylPREDNISolone acetate (DEPO-MEDROL) injection 40 mg  . betamethasone acetate-betamethasone sodium phosphate (CELESTONE) injection 6 mg     Laterality: Left  Location/Site:  L4-L5  Needle size: 22 guage  Needle type: Spinal  Needle Placement: Articular  Findings:  -Comments: Excellent flow of contrast producing a partial arthrogram.  Procedure Details: The fluoroscope beam is vertically oriented in AP, and the inferior recess is visualized beneath the lower pole of the inferior apophyseal process, which represents the target point for needle insertion. When direct visualization is difficult the target point is located at the medial projection of the vertebral pedicle. The region overlying each aforementioned target is locally anesthetized with a 1 to 2 ml. volume of 1% Lidocaine without Epinephrine.   The spinal needle was inserted into each of the above mentioned facet joints using biplanar fluoroscopic guidance. A 0.25 to 0.5 ml. volume of Isovue-250 was injected and a partial facet joint arthrogram was obtained. A single spot film was obtained of the resulting arthrogram.    One to 1.25 ml of the steroid/anesthetic solution was then injected into each of the facet joints noted above.   Additional Comments:  The patient tolerated the procedure well Dressing: 2 x 2  sterile gauze and Band-Aid    Post-procedure details: Patient was observed during the procedure. Post-procedure instructions were reviewed.  Patient left the clinic in stable condition.

## 2019-07-13 NOTE — Procedures (Signed)
Lumbar Epidural Steroid Injection - Interlaminar Approach with Fluoroscopic Guidance  Patient: Andrew [name]      Date of Birth: Oct 24, 1951 MRN: [id number] PCP: Bernerd Limbo, MD      Visit Date: 05/29/2019   Universal Protocol:     Consent Given By: the patient  Position: PRONE  Additional Comments: Vital signs were monitored before and after the procedure. Patient was prepped and draped in the usual sterile fashion. The correct patient, procedure, and site was verified.   Injection Procedure Details:  Procedure Site One Meds Administered:  Meds ordered this encounter  Medications  . methylPREDNISolone acetate (DEPO-MEDROL) injection 40 mg  . betamethasone acetate-betamethasone sodium phosphate (CELESTONE) injection 6 mg     Laterality: Left  Location/Site:  L5-S1  Needle size: 20 G  Needle type: Tuohy  Needle Placement: Paramedian epidural  Findings:   -Comments: Excellent flow of contrast into the epidural space.  Procedure Details: Using a paramedian approach from the side mentioned above, the region overlying the inferior lamina was localized under fluoroscopic visualization and the soft tissues overlying this structure were infiltrated with 4 ml. of 1% Lidocaine without Epinephrine. The Tuohy needle was inserted into the epidural space using a paramedian approach.   The epidural space was localized using loss of resistance along with lateral and bi-planar fluoroscopic views.  After negative aspirate for air, blood, and CSF, a 2 ml. volume of Isovue-250 was injected into the epidural space and the flow of contrast was observed. Radiographs were obtained for documentation purposes.    The injectate was administered into the level noted above.   Additional Comments:  The patient tolerated the procedure well Dressing: 2 x 2 sterile gauze and Band-Aid    Post-procedure details: Patient was observed during the procedure. Post-procedure instructions were  reviewed.  Patient left the clinic in stable condition.

## 2019-07-13 NOTE — Progress Notes (Signed)
Andrew [name] - 68 y.o. male MRN [id number]  Date of birth: Jun 03, 1951  Office Visit Note: Visit Date: 05/29/2019 PCP: Bernerd Limbo, MD Referred by: Bernerd Limbo, MD  Subjective: Chief Complaint  Patient presents with  . Lower Back - Pain  . Left Leg - Pain   HPI:  Andrew [name] is a 68 y.o. male who comes in today For planned repeat left L5-S1 interlaminar epidural steroid injection and left L4-5 facet joint block.  This is requested by his spine surgeon Dr. Basil Dess.  He has had continued follow-up with him.  He reports prior injection which was the same injection in December 2019 gave him great relief up until recently.  He has had no new issues.  He will follow-up with Dr. Louanne Skye  ROS Otherwise per HPI.  Assessment & Plan: Visit Diagnoses:  1. Spondylosis without myelopathy or radiculopathy, lumbar region   2. Lumbar radiculopathy     Plan: No additional findings.   Meds & Orders:  Meds ordered this encounter  Medications  . methylPREDNISolone acetate (DEPO-MEDROL) injection 40 mg  . betamethasone acetate-betamethasone sodium phosphate (CELESTONE) injection 6 mg    Orders Placed This Encounter  Procedures  . Facet Injection  . XR C-ARM NO REPORT  . Epidural Steroid injection    Follow-up: No follow-ups on file.   Procedures: No procedures performed  Lumbar Epidural Steroid Injection - Interlaminar Approach with Fluoroscopic Guidance  Patient: Andrew [name]      Date of Birth: Jul 20, 1951 MRN: [id number] PCP: Bernerd Limbo, MD      Visit Date: 05/29/2019   Universal Protocol:     Consent Given By: the patient  Position: PRONE  Additional Comments: Vital signs were monitored before and after the procedure. Patient was prepped and draped in the usual sterile fashion. The correct patient, procedure, and site was verified.   Injection Procedure Details:  Procedure Site One Meds Administered:  Meds ordered this encounter  Medications  .  methylPREDNISolone acetate (DEPO-MEDROL) injection 40 mg  . betamethasone acetate-betamethasone sodium phosphate (CELESTONE) injection 6 mg     Laterality: Left  Location/Site:  L5-S1  Needle size: 20 G  Needle type: Tuohy  Needle Placement: Paramedian epidural  Findings:   -Comments: Excellent flow of contrast into the epidural space.  Procedure Details: Using a paramedian approach from the side mentioned above, the region overlying the inferior lamina was localized under fluoroscopic visualization and the soft tissues overlying this structure were infiltrated with 4 ml. of 1% Lidocaine without Epinephrine. The Tuohy needle was inserted into the epidural space using a paramedian approach.   The epidural space was localized using loss of resistance along with lateral and bi-planar fluoroscopic views.  After negative aspirate for air, blood, and CSF, a 2 ml. volume of Isovue-250 was injected into the epidural space and the flow of contrast was observed. Radiographs were obtained for documentation purposes.    The injectate was administered into the level noted above.   Additional Comments:  The patient tolerated the procedure well Dressing: 2 x 2 sterile gauze and Band-Aid    Post-procedure details: Patient was observed during the procedure. Post-procedure instructions were reviewed.  Patient left the clinic in stable condition.  Lumbar Facet Joint Intra-Articular Injection(s) with Fluoroscopic Guidance  Patient: Andrew [name]      Date of Birth: 1951/10/24 MRN: [id number] PCP: Bernerd Limbo, MD      Visit Date: 05/29/2019   Universal Protocol:    Date/Time: 05/29/2019  Consent Given By:  the patient  Position: PRONE   Additional Comments: Vital signs were monitored before and after the procedure. Patient was prepped and draped in the usual sterile fashion. The correct patient, procedure, and site was verified.   Injection Procedure Details:  Procedure Site One  Meds Administered:  Meds ordered this encounter  Medications  . methylPREDNISolone acetate (DEPO-MEDROL) injection 40 mg  . betamethasone acetate-betamethasone sodium phosphate (CELESTONE) injection 6 mg     Laterality: Left  Location/Site:  L4-L5  Needle size: 22 guage  Needle type: Spinal  Needle Placement: Articular  Findings:  -Comments: Excellent flow of contrast producing a partial arthrogram.  Procedure Details: The fluoroscope beam is vertically oriented in AP, and the inferior recess is visualized beneath the lower pole of the inferior apophyseal process, which represents the target point for needle insertion. When direct visualization is difficult the target point is located at the medial projection of the vertebral pedicle. The region overlying each aforementioned target is locally anesthetized with a 1 to 2 ml. volume of 1% Lidocaine without Epinephrine.   The spinal needle was inserted into each of the above mentioned facet joints using biplanar fluoroscopic guidance. A 0.25 to 0.5 ml. volume of Isovue-250 was injected and a partial facet joint arthrogram was obtained. A single spot film was obtained of the resulting arthrogram.    One to 1.25 ml of the steroid/anesthetic solution was then injected into each of the facet joints noted above.   Additional Comments:  The patient tolerated the procedure well Dressing: 2 x 2 sterile gauze and Band-Aid    Post-procedure details: Patient was observed during the procedure. Post-procedure instructions were reviewed.  Patient left the clinic in stable condition.    Clinical History: No specialty comments available.     Objective:  VS:  HT:    WT:   BMI:     BP:130/79  HR:(!) 58bpm  TEMP: ( )  RESP:  Physical Exam  Ortho Exam Imaging: No results found.

## 2019-08-18 ENCOUNTER — Encounter

## 2019-09-10 ENCOUNTER — Other Ambulatory Visit (INDEPENDENT_AMBULATORY_CARE_PROVIDER_SITE_OTHER): Payer: Self-pay | Admitting: Specialist

## 2019-09-10 MED ORDER — HYDROCODONE-ACETAMINOPHEN 7.5-325 MG PO TABS: 1.0000 | ORAL_TABLET | Freq: Four times a day (QID) | ORAL | 0 refills | Status: DC | PRN

## 2019-09-10 NOTE — Telephone Encounter (Signed)
Nitka patient 

## 2020-02-14 ENCOUNTER — Ambulatory Visit: Payer: Medicaid Other | Attending: Internal Medicine

## 2020-02-14 DIAGNOSIS — Z23 Encounter for immunization: Secondary | ICD-10-CM

## 2020-02-14 NOTE — Progress Notes (Addendum)
   Covid-19 Vaccination Clinic  Name:  Lional Nicklaus    MRN: [id number] DOB: [birth year]  [id number]  Mr. Drewett was observed post Covid-19 immunization for 30 minutes based on pre-vaccination screening without incident. He was provided with Vaccine Information Sheet and instruction to access the V-Safe system.   Mr. Colan was instructed to call 911 with any severe reactions post vaccine: Marland Kitchen Difficulty breathing  . Swelling of face and throat  . A fast heartbeat  . A bad rash all over body  . Dizziness and weakness   Immunizations Administered    Name Date Dose VIS Date Route   Pfizer COVID-19 Vaccine 02/14/2020  9:12 AM 0.3 mL 11/23/2019 Intramuscular   Manufacturer: [place]   Lot: [id number]   [place]: [id number]

## 2020-02-14 NOTE — Progress Notes (Signed)
   Covid-19 Vaccination Clinic  Name:  Andrew [name]    MRN: [id number] DOB: 02-02-1951  [id number]  [name] was observed post Covid-19 immunization for 30 minutes based on pre-vaccination screening without incident. He was provided with Vaccine Information Sheet and instruction to access the V-Safe system.   [name] was instructed to call 911 with any severe reactions post vaccine: Marland Kitchen Difficulty breathing  . Swelling of face and throat  . A fast heartbeat  . A bad rash all over body  . Dizziness and weakness   Immunizations Administered    Name Date Dose VIS Date Route   Pfizer COVID-19 Vaccine 02/14/2020  9:12 AM 0.3 mL 11/23/2019 Intramuscular   Manufacturer: [place]   Lot: [id number]   [place]: [id number]

## 2020-02-16 ENCOUNTER — Other Ambulatory Visit: Payer: Self-pay | Admitting: Cardiovascular Disease

## 2020-03-11 ENCOUNTER — Ambulatory Visit: Payer: Medicaid Other | Attending: Internal Medicine

## 2020-03-11 DIAGNOSIS — Z23 Encounter for immunization: Secondary | ICD-10-CM

## 2020-03-11 NOTE — Progress Notes (Signed)
   Covid-19 Vaccination Clinic  Name:  Andrew [name]    MRN: [id number] DOB: February 06, 1951  [id number]  Mr. Sheffler was observed post Covid-19 immunization for 15 minutes without incident. He was provided with Vaccine Information Sheet and instruction to access the V-Safe system.   Mr. Hargraves was instructed to call 911 with any severe reactions post vaccine: Marland Kitchen Difficulty breathing  . Swelling of face and throat  . A fast heartbeat  . A bad rash all over body  . Dizziness and weakness   Immunizations Administered    Name Date Dose VIS Date Route   Pfizer COVID-19 Vaccine 03/11/2020 12:43 PM 0.3 mL 11/23/2019 Intramuscular   Manufacturer: [place]   Lot: [id number]   [place]: [id number]

## 2020-03-17 ENCOUNTER — Telehealth: Payer: Self-pay | Admitting: *Deleted

## 2020-03-18 NOTE — Telephone Encounter (Signed)
Pt is scheduled 04/09/20 with driver.

## 2020-03-18 NOTE — Telephone Encounter (Signed)
ok 

## 2020-04-09 ENCOUNTER — Ambulatory Visit (INDEPENDENT_AMBULATORY_CARE_PROVIDER_SITE_OTHER): Payer: Medicaid Other | Admitting: Physical Medicine and Rehabilitation

## 2020-04-09 ENCOUNTER — Other Ambulatory Visit: Payer: Self-pay

## 2020-04-09 ENCOUNTER — Ambulatory Visit: Payer: Self-pay

## 2020-04-09 ENCOUNTER — Encounter: Payer: Self-pay | Admitting: Physical Medicine and Rehabilitation

## 2020-04-09 VITALS — BP 126/82 | HR 62

## 2020-04-09 DIAGNOSIS — M5416 Radiculopathy, lumbar region: Secondary | ICD-10-CM

## 2020-04-09 MED ORDER — METHYLPREDNISOLONE ACETATE 80 MG/ML IJ SUSP
40.0000 mg | Freq: Once | INTRAMUSCULAR | Status: AC
  Administered 2020-04-09: 40 mg

## 2020-04-09 NOTE — Progress Notes (Signed)
  Numeric Pain Rating Scale and Functional Assessment Average Pain 8   In the last MONTH (on 0-10 scale) has pain interfered with the following?  1. General activity like being  able to carry out your everyday physical activities such as walking, climbing stairs, carrying groceries, or moving a chair?  Rating(8)   +Driver, +BT- Xarelto, -Dye Allergies.

## 2020-04-10 NOTE — Progress Notes (Signed)
Andrew [name] - 69 y.o. male MRN [id number]  Date of birth: [birth year]  Office Visit Note: Visit Date: 04/09/2020 PCP: Bernerd Limbo, MD Referred by: Bernerd Limbo, MD  Subjective: Chief Complaint  Patient presents with  . Lower Back - Pain   HPI:  Andrew [name] is a 69 y.o. male who comes in today For repeat epidural injection with left radicular complaints over the last 6 weeks with chronic worsening.  Patient's symptoms are in a pretty classic L5 distribution.  His brother is with him who provides some of the history and helps to interpret.  We will complete left L5 transforaminal epidural steroid injection today.  Last injection gave him 6 months of relief.  ROS Otherwise per HPI.  Assessment & Plan: Visit Diagnoses:  1. Lumbar radiculopathy     Plan: No additional findings.   Meds & Orders:  Meds ordered this encounter  Medications  . methylPREDNISolone acetate (DEPO-MEDROL) injection 40 mg    Orders Placed This Encounter  Procedures  . XR C-ARM NO REPORT  . Epidural Steroid injection    Follow-up: Return if symptoms worsen or fail to improve.   Procedures: No procedures performed  Lumbosacral Transforaminal Epidural Steroid Injection - Sub-Pedicular Approach with Fluoroscopic Guidance  Patient: Andrew [name]      Date of Birth: December 25, 1950 MRN: [id number] PCP: Bernerd Limbo, MD      Visit Date: 04/09/2020   Universal Protocol:    Date/Time: 04/09/2020  Consent Given By: the patient  Position: PRONE  Additional Comments: Vital signs were monitored before and after the procedure. Patient was prepped and draped in the usual sterile fashion. The correct patient, procedure, and site was verified.   Injection Procedure Details:  Procedure Site One Meds Administered:  Meds ordered this encounter  Medications  . methylPREDNISolone acetate (DEPO-MEDROL) injection 40 mg    Laterality: Left  Location/Site:  L5-S1  Needle size: 22 G  Needle type:  Spinal  Needle Placement: Transforaminal  Findings:    -Comments: Excellent flow of contrast along the nerve and into the epidural space.  Procedure Details: After squaring off the end-plates to get a true AP view, the C-arm was positioned so that an oblique view of the foramen as noted above was visualized. The target area is just inferior to the "nose of the scotty dog" or sub pedicular. The soft tissues overlying this structure were infiltrated with 2-3 ml. of 1% Lidocaine without Epinephrine.  The spinal needle was inserted toward the target using a "trajectory" view along the fluoroscope beam.  Under AP and lateral visualization, the needle was advanced so it did not puncture dura and was located close the 6 O'Clock position of the pedical in AP tracterory. Biplanar projections were used to confirm position. Aspiration was confirmed to be negative for CSF and/or blood. A 1-2 ml. volume of Isovue-250 was injected and flow of contrast was noted at each level. Radiographs were obtained for documentation purposes.   After attaining the desired flow of contrast documented above, a 0.5 to 1.0 ml test dose of 0.25% Marcaine was injected into each respective transforaminal space.  The patient was observed for 90 seconds post injection.  After no sensory deficits were reported, and normal lower extremity motor function was noted,   the above injectate was administered so that equal amounts of the injectate were placed at each foramen (level) into the transforaminal epidural space.   Additional Comments:  The patient tolerated the procedure well Dressing: 2 x 2 sterile  gauze and Band-Aid    Post-procedure details: Patient was observed during the procedure. Post-procedure instructions were reviewed.  Patient left the clinic in stable condition.     Clinical History: Acute Interface, Incoming Rad Results - 10/27/2018  4:36 PM EST HISTORY:  left radicular pain, prior surg 2017  TECHNIQUE:  MRI of the lumbar spine without contrast.  COMPARISON: September 21, 2016  FINDINGS: No evidence of acute fracture or subluxation. Minimal degenerative anterolisthesis of L4 on L5. Conus terminates in normal position. Small amount of chronic STIR signal abnormality in the left L4 and L5 pedicles, similar to prior study.   Degenerative changes as follows: T12-L1: No significant stenosis L1-L2: No significant stenosis L2-L3: Small disc bulge. There is a small left paracentral disc extrusion migrated inferiorly behind the L3 vertebral body. Mild facet degenerative changes. There is mild central canal and right-sided foraminal stenosis. L3-L4: Small disc bulge. Mild facet degenerative changes. Minimal central canal stenosis. Mild bilateral foraminal stenosis. L4-L5: Broad-based disc osteophyte complex which is eccentric to the left. Facet degenerative changes. Ligamentum flavum hypertrophy. There is moderate central canal stenosis. There is moderate-severe left and mild right foraminal stenosis. Findings are  similar to prior. L5-S1: Small broad-based disc bulge and facet degenerative changes. No significant stenosis.   IMPRESSION:  1. New small left paracentral disc extrusion at L2-L3, migrated inferiorly behind the L3 vertebral body. There is associated mild canal stenosis. 2. Central canal and foraminal stenosis remaining greatest at L4-L5. Similar to prior.  Please see above comments for full details.  Electronically Signed by: Oretha Milch     Objective:  VS:  HT:    WT:   BMI:     BP:126/82  HR:62bpm  TEMP: ( )  RESP:  Physical Exam  Ortho Exam Imaging: XR C-ARM NO REPORT  Result Date: 04/09/2020 Please see Notes tab for imaging impression.

## 2020-04-10 NOTE — Procedures (Signed)
Lumbosacral Transforaminal Epidural Steroid Injection - Sub-Pedicular Approach with Fluoroscopic Guidance  Patient: Andrew [name]      Date of Birth: Apr 29, 1951 MRN: [id number] PCP: Bernerd Limbo, MD      Visit Date: 04/09/2020   Universal Protocol:    Date/Time: 04/09/2020  Consent Given By: the patient  Position: PRONE  Additional Comments: Vital signs were monitored before and after the procedure. Patient was prepped and draped in the usual sterile fashion. The correct patient, procedure, and site was verified.   Injection Procedure Details:  Procedure Site One Meds Administered:  Meds ordered this encounter  Medications  . methylPREDNISolone acetate (DEPO-MEDROL) injection 40 mg    Laterality: Left  Location/Site:  L5-S1  Needle size: 22 G  Needle type: Spinal  Needle Placement: Transforaminal  Findings:    -Comments: Excellent flow of contrast along the nerve and into the epidural space.  Procedure Details: After squaring off the end-plates to get a true AP view, the C-arm was positioned so that an oblique view of the foramen as noted above was visualized. The target area is just inferior to the "nose of the scotty dog" or sub pedicular. The soft tissues overlying this structure were infiltrated with 2-3 ml. of 1% Lidocaine without Epinephrine.  The spinal needle was inserted toward the target using a "trajectory" view along the fluoroscope beam.  Under AP and lateral visualization, the needle was advanced so it did not puncture dura and was located close the 6 O'Clock position of the pedical in AP tracterory. Biplanar projections were used to confirm position. Aspiration was confirmed to be negative for CSF and/or blood. A 1-2 ml. volume of Isovue-250 was injected and flow of contrast was noted at each level. Radiographs were obtained for documentation purposes.   After attaining the desired flow of contrast documented above, a 0.5 to 1.0 ml test dose of 0.25%  Marcaine was injected into each respective transforaminal space.  The patient was observed for 90 seconds post injection.  After no sensory deficits were reported, and normal lower extremity motor function was noted,   the above injectate was administered so that equal amounts of the injectate were placed at each foramen (level) into the transforaminal epidural space.   Additional Comments:  The patient tolerated the procedure well Dressing: 2 x 2 sterile gauze and Band-Aid    Post-procedure details: Patient was observed during the procedure. Post-procedure instructions were reviewed.  Patient left the clinic in stable condition.

## 2020-05-19 ENCOUNTER — Telehealth: Payer: Self-pay | Admitting: Family Medicine

## 2020-05-19 NOTE — Telephone Encounter (Signed)
Patient's sister in law, Andrew [name] (who is a patient of Dr Thompson Caul) called asking to schedule an appointment for him. He is having leg/knee pain and also cramping. He is currently seeing Dr Noemi Chapel but they feel like he would benefit from a sports medicine physician and [facility] recommended Dr Tamala Julian. He only has Medicaid.  Would you be willing to see him or would it be best to suggest another sports medicine physician who would be able to take his insurance?

## 2020-05-20 NOTE — Telephone Encounter (Signed)
Okay per Dr Tamala Julian. Appointment has been scheduled.

## 2020-05-21 ENCOUNTER — Telehealth: Payer: Self-pay | Admitting: Physical Medicine and Rehabilitation

## 2020-05-21 NOTE — Telephone Encounter (Signed)
Try L4 tf

## 2020-05-21 NOTE — Telephone Encounter (Signed)
Patient's brother is asking about RFA. Patient has had left L4-5 facet injections- 11/21/18 and 05/29/19. His brother states that you have discussed this with him in the past and he understands that this is for the low back pain and not the toe pain. He states that his brother's pain is mostly in his low back at this time.

## 2020-05-21 NOTE — Telephone Encounter (Signed)
Pt called complaining of back pains and would like to get set up for an appt.  [phone]

## 2020-05-21 NOTE — Telephone Encounter (Signed)
If he has had a course of PT or Chiro and we can document pain relief with facets etc then might can get RFA pre-auth

## 2020-05-21 NOTE — Telephone Encounter (Signed)
Left L5 TF 4/28. Ok to repeat if helped, same problem/side, and no new injury?

## 2020-05-22 ENCOUNTER — Other Ambulatory Visit: Payer: Self-pay | Admitting: Physical Medicine and Rehabilitation

## 2020-05-22 MED ORDER — TRAMADOL HCL 50 MG PO TABS: 50.0000 mg | ORAL_TABLET | Freq: Two times a day (BID) | ORAL | 0 refills | Status: DC | PRN

## 2020-05-22 NOTE — Telephone Encounter (Signed)
Called patient's brother to advise that prescription was sent to pharmacy.

## 2020-05-22 NOTE — Telephone Encounter (Signed)
Patient is scheduled for RFA on 7/15. He is requesting Tramadol. Pharmacy is correct. Please advise.

## 2020-05-22 NOTE — Telephone Encounter (Signed)
done

## 2020-05-28 ENCOUNTER — Other Ambulatory Visit: Payer: Self-pay

## 2020-05-28 ENCOUNTER — Encounter: Payer: Self-pay | Admitting: Family Medicine

## 2020-05-28 ENCOUNTER — Ambulatory Visit (INDEPENDENT_AMBULATORY_CARE_PROVIDER_SITE_OTHER): Payer: Medicaid Other

## 2020-05-28 ENCOUNTER — Ambulatory Visit (INDEPENDENT_AMBULATORY_CARE_PROVIDER_SITE_OTHER): Payer: Medicaid Other | Admitting: Family Medicine

## 2020-05-28 VITALS — BP 140/76 | HR 59 | Ht 70.0 in | Wt 189.0 lb

## 2020-05-28 DIAGNOSIS — M179 Osteoarthritis of knee, unspecified: Secondary | ICD-10-CM | POA: Insufficient documentation

## 2020-05-28 DIAGNOSIS — M7989 Other specified soft tissue disorders: Secondary | ICD-10-CM | POA: Diagnosis not present

## 2020-05-28 DIAGNOSIS — G8929 Other chronic pain: Secondary | ICD-10-CM | POA: Diagnosis not present

## 2020-05-28 DIAGNOSIS — M25562 Pain in left knee: Secondary | ICD-10-CM

## 2020-05-28 DIAGNOSIS — M1711 Unilateral primary osteoarthritis, right knee: Secondary | ICD-10-CM

## 2020-05-28 DIAGNOSIS — M25561 Pain in right knee: Secondary | ICD-10-CM

## 2020-05-28 DIAGNOSIS — M1710 Unilateral primary osteoarthritis, unspecified knee: Secondary | ICD-10-CM | POA: Insufficient documentation

## 2020-05-28 DIAGNOSIS — I82401 Acute embolism and thrombosis of unspecified deep veins of right lower extremity: Secondary | ICD-10-CM

## 2020-05-28 NOTE — Assessment & Plan Note (Signed)
Secondary to DVT with hemosiderin deposits.  Continue compression patient is on blood thinner

## 2020-05-28 NOTE — Assessment & Plan Note (Signed)
Degenerative right knee.  Discussed with patient in great length.  Discussed icing regimen and home exercises.  Discussed which activities to do which wants to avoid.  Patient could be a candidate for viscosupplementation and we will see if we can get this approved.  Otherwise patient will need serial injections of steroid to help with some of them chronic swelling.  Patient wants to avoid any type of surgical intervention at the moment.  Follow-up again in 4 to 8 weeks

## 2020-05-28 NOTE — Progress Notes (Signed)
[place] [address] [place] Phone: [phone] Subjective:   I [name] am serving as a Education administrator for Dr. Hulan Saas.  This visit occurred during the SARS-CoV-2 public health emergency.  Safety protocols were in place, including screening questions prior to the visit, additional usage of staff PPE, and extensive cleaning of exam room while observing appropriate contact time as indicated for disinfecting solutions.    I'm seeing this patient by the request  of:  Bernerd Limbo, MD  CC: Knee pain  OXB:[id number]  Andrew [name] is a 69 y.o. male coming in with complaint of bilateral knee pain. Has had surgery on the right knee. States he had has right medial ankle pain due to a fall on black ice. Pain radiates to his back.   Onset- 2 years  Location - medial  Duration-  Character-  Aggravating factors- walking  Reliving factors-  Therapies tried- ice, acetaminophen  Severity- 8/10 at its worse    Patient has seen another provider previously and given an injection in the right knee.  Patient only speaks minor English and is accompanied with her brother who is helping translate.  Past Medical History:  Diagnosis Date  . DVT (deep venous thrombosis) (HCC)    right leg in 2011; prior injury to right foot  . HLD (hyperlipidemia)   . Right foot injury    No past surgical history on file. Social History   Socioeconomic History  . Marital status: Divorced    Spouse name: Not on file  . Number of children: Not on file  . Years of education: Not on file  . Highest education level: Not on file  Occupational History  . Not on file  Tobacco Use  . Smoking status: Former Smoker    Types: Cigarettes    Quit date: 07/25/1982    Years since quitting: 37.8  . Smokeless tobacco: Never Used  . Tobacco comment: REMOTE SOCIAL HISTORY  Substance and Sexual Activity  . Alcohol use: No  . Drug use: No  . Sexual activity: Not  Currently  Other Topics Concern  . Not on file  Social History Narrative  . Not on file   Social Determinants of Health   Financial Resource Strain:   . Difficulty of Paying Living Expenses:   Food Insecurity:   . Worried About Charity fundraiser in the Last Year:   . Arboriculturist in the Last Year:   Transportation Needs:   . Film/video editor (Medical):   Marland Kitchen Lack of Transportation (Non-Medical):   Physical Activity:   . Days of Exercise per Week:   . Minutes of Exercise per Session:   Stress:   . Feeling of Stress :   Social Connections:   . Frequency of Communication with Friends and Family:   . Frequency of Social Gatherings with Friends and Family:   . Attends Religious Services:   . Active Member of Clubs or Organizations:   . Attends Archivist Meetings:   Marland Kitchen Marital Status:    Allergies  Allergen Reactions  . Penicillins Anaphylaxis  . Influenza Vaccines     Broke out in hives and had breathing problems   Family History  Problem Relation Age of Onset  . Heart disease Mother   . Heart disease Father   . Vascular Disease Maternal Grandfather      Current Outpatient Medications (Cardiovascular):  .  atorvastatin (LIPITOR) 40 MG tablet, TAKE  1 TABLET (40 MG TOTAL) BY MOUTH DAILY AT 6 PM.   Current Outpatient Medications (Analgesics):  .  traMADol (ULTRAM) 50 MG tablet, Take 1 tablet (50 mg total) by mouth every 12 (twelve) hours as needed.  Current Outpatient Medications (Hematological):  .  rivaroxaban (XARELTO) 10 MG TABS tablet, Take 1 tablet (10 mg total) by mouth daily.  Current Outpatient Medications (Other):  .  tiZANidine (ZANAFLEX) 4 MG tablet, Take 1 tablet by mouth as needed.   Reviewed prior external information including notes and imaging from  primary care provider As well as notes that were available from care everywhere and other healthcare systems.  Past medical history, social, surgical and family history all reviewed  in electronic medical record.  No pertanent information unless stated regarding to the chief complaint.   Review of Systems:  No headache, visual changes, nausea, vomiting, diarrhea, constipation, dizziness, abdominal pain, skin rash, fevers, chills, night sweats, weight loss, swollen lymph nodes, body aches, joint swelling, chest pain, shortness of breath, mood changes. POSITIVE muscle aches  Objective  Blood pressure 140/76, pulse (!) 59, height 5\' 10"  (1.778 m), weight 189 lb (85.7 kg), SpO2 97 %.   General: No apparent distress alert and oriented x3 mood and affect normal, dressed appropriately.  HEENT: Pupils equal, extraocular movements intact  Respiratory: Patient's speak in full sentences and does not appear short of breath  Cardiovascular: Lower extremity swelling noted on the right lower extremity with some hemosiderin deposits.  Appears to be chronic.  No pain noted with [place] Silos test. Neuro: Cranial nerves II through XII are intact, neurovascularly intact in all extremities with 2+ DTRs and 2+ pulses.  Gait mild antalgic MSK: Crepitus noted with the knee noted.  Patient does have a trace effusion noted.  Lacks last 5 degrees of extension.  Patient's right knee does have some mild instability with varus and valgus force.    Impression and Recommendations:     The above documentation has been reviewed and is accurate and complete Lyndal Pulley, DO       Note: This dictation was prepared with Dragon dictation along with smaller phrase technology. Any transcriptional errors that result from this process are unintentional.

## 2020-05-28 NOTE — Patient Instructions (Addendum)
Good to see you Try the hinge brace pennsaid pinkie amount topically 2 times daily as needed.  Turmeric 500mg  daily  Tart cherry extract 1200mg  at night Vitamin D 2000 IU daily  Spenco orthotics "total support" Hoka or bondi for rocker bottom shoes See me again in 6 weeks we will consider gel injection or different steroid

## 2020-06-20 ENCOUNTER — Telehealth: Payer: Self-pay | Admitting: Physical Medicine and Rehabilitation

## 2020-06-20 NOTE — Telephone Encounter (Signed)
Patient called needing to rescheduled his appointment.. The number to contact patient is [phone]

## 2020-06-20 NOTE — Telephone Encounter (Signed)
Called pt and he states he will see if he can get off work for 06/26/20 since we are booking out far for our next available appt for RFA. Pt states he will give [place] a call back if he needs to change appt.

## 2020-06-26 ENCOUNTER — Ambulatory Visit: Payer: Self-pay

## 2020-06-26 ENCOUNTER — Encounter: Payer: Self-pay | Admitting: Physical Medicine and Rehabilitation

## 2020-06-26 ENCOUNTER — Ambulatory Visit: Payer: Medicaid Other | Admitting: Physical Medicine and Rehabilitation

## 2020-06-26 ENCOUNTER — Other Ambulatory Visit: Payer: Self-pay

## 2020-06-26 VITALS — BP 126/78 | HR 73

## 2020-06-26 DIAGNOSIS — M47816 Spondylosis without myelopathy or radiculopathy, lumbar region: Secondary | ICD-10-CM | POA: Diagnosis not present

## 2020-06-26 MED ORDER — METHYLPREDNISOLONE ACETATE 80 MG/ML IJ SUSP
80.0000 mg | Freq: Once | INTRAMUSCULAR | Status: AC
  Administered 2020-06-26: 80 mg

## 2020-06-26 NOTE — Procedures (Signed)
Lumbar Facet Joint Nerve Denervation  Patient: Andrew [name]      Date of Birth: 11-12-1951 MRN: [id number] PCP: Bernerd Limbo, MD      Visit Date: 06/26/2020   Universal Protocol:    Date/Time: 06/26/2110:00 AM  Consent Given By: the patient  Position: PRONE  Additional Comments: Vital signs were monitored before and after the procedure. Patient was prepped and draped in the usual sterile fashion. The correct patient, procedure, and site was verified.   Injection Procedure Details:  Procedure Site One Meds Administered:  Meds ordered this encounter  Medications  . methylPREDNISolone acetate (DEPO-MEDROL) injection 80 mg     Laterality: Left  Location/Site:  L4-L5  Needle size: 18 G  Needle type: Radiofrequency cannula  Needle Placement: Along juncture of superior articular process and transverse pocess  Findings:  -Comments:  Procedure Details: For each desired target nerve, the corresponding transverse process (sacral ala for the L5 dorsal rami) was identified and the fluoroscope was positioned to square off the endplates of the corresponding vertebral body to achieve a true AP midline view.  The beam was then obliqued 15 to 20 degrees and caudally tilted 15 to 20 degrees to line up a trajectory along the target nerves. The skin over the target of the junction of superior articulating process and transverse process (sacral ala for the L5 dorsal rami) was infiltrated with 67ml of 1% Lidocaine without Epinephrine.  The 18 gauge 5mm active tip outer cannula was advanced in trajectory view to the target.  This procedure was repeated for each target nerve.  Then, for all levels, the outer cannula placement was fine-tuned and the position was then confirmed with bi-planar imaging.    Test stimulation was done both at sensory and motor levels to ensure there was no radicular stimulation. The target tissues were then infiltrated with 1 ml of 1% Lidocaine without Epinephrine.  Subsequently, a percutaneous neurotomy was carried out for 90 seconds at 80 degrees Celsius.  After the completion of the lesion, 1 ml of injectate was delivered. It was then repeated for each facet joint nerve mentioned above. Appropriate radiographs were obtained to verify the probe placement during the neurotomy.   Additional Comments:  The patient tolerated the procedure well Dressing: 2 x 2 sterile gauze and Band-Aid    Post-procedure details: Patient was observed during the procedure. Post-procedure instructions were reviewed.  Patient left the clinic in stable condition.

## 2020-06-26 NOTE — Progress Notes (Signed)
Pt states lower back pain. Pt makes walking standing makes the pain worse. Pt states medicine makes the pain better.  Numeric Pain Rating Scale and Functional Assessment Average Pain 9   In the last MONTH (on 0-10 scale) has pain interfered with the following?  1. General activity like being  able to carry out your everyday physical activities such as walking, climbing stairs, carrying groceries, or moving a chair?  Rating(8)   +Driver, -BT, -Dye Allergies.

## 2020-06-26 NOTE — Progress Notes (Signed)
Andrew [name] - 69 y.o. male MRN [id number]  Date of birth: Mar 09, 1951  Office Visit Note: Visit Date: 06/26/2020 PCP: Bernerd Limbo, MD Referred by: Bernerd Limbo, MD  Subjective: Chief Complaint  Patient presents with  . Lower Back - Pain   HPI:  Andrew [name] is a 69 y.o. male who comes in today for planned radiofrequency ablation of the Left L4-L5 Lumbar facet joints. This would be ablation of the corresponding medial branches and/or dorsal rami.  Patient has had double diagnostic blocks with more than 50% relief.  These are documented on pain diary.  They have had chronic back pain for quite some time, more than 3 months, which has been an ongoing situation with recalcitrant axial back pain.  They have no radicular pain.  Their axial pain is worse with standing and ambulating and on exam today with facet loading.  They have had physical therapy as well as home exercise program.  The imaging noted in the chart below indicated facet pathology. Accordingly they meet all the criteria and qualification for for radiofrequency ablation and we are going to complete this today hopefully for more longer term relief as part of comprehensive management program.  ROS Otherwise per HPI.  Assessment & Plan: Visit Diagnoses:  1. Spondylosis without myelopathy or radiculopathy, lumbar region     Plan: No additional findings.   Meds & Orders:  Meds ordered this encounter  Medications  . methylPREDNISolone acetate (DEPO-MEDROL) injection 80 mg    Orders Placed This Encounter  Procedures  . Radiofrequency,Lumbar  . XR C-ARM NO REPORT    Follow-up: Return if symptoms worsen or fail to improve.   Procedures: No procedures performed  Lumbar Facet Joint Nerve Denervation  Patient: Andrew [name]      Date of Birth: [birth year] MRN: [id number] PCP: Bernerd Limbo, MD      Visit Date: 06/26/2020   Universal Protocol:    Date/Time: 06/26/2110:00 AM  Consent Given By: the patient  Position:  PRONE  Additional Comments: Vital signs were monitored before and after the procedure. Patient was prepped and draped in the usual sterile fashion. The correct patient, procedure, and site was verified.   Injection Procedure Details:  Procedure Site One Meds Administered:  Meds ordered this encounter  Medications  . methylPREDNISolone acetate (DEPO-MEDROL) injection 80 mg     Laterality: Left  Location/Site:  L4-L5  Needle size: 18 G  Needle type: Radiofrequency cannula  Needle Placement: Along juncture of superior articular process and transverse pocess  Findings:  -Comments:  Procedure Details: For each desired target nerve, the corresponding transverse process (sacral ala for the L5 dorsal rami) was identified and the fluoroscope was positioned to square off the endplates of the corresponding vertebral body to achieve a true AP midline view.  The beam was then obliqued 15 to 20 degrees and caudally tilted 15 to 20 degrees to line up a trajectory along the target nerves. The skin over the target of the junction of superior articulating process and transverse process (sacral ala for the L5 dorsal rami) was infiltrated with 25ml of 1% Lidocaine without Epinephrine.  The 18 gauge 64mm active tip outer cannula was advanced in trajectory view to the target.  This procedure was repeated for each target nerve.  Then, for all levels, the outer cannula placement was fine-tuned and the position was then confirmed with bi-planar imaging.    Test stimulation was done both at sensory and motor levels to ensure there was no radicular stimulation.  The target tissues were then infiltrated with 1 ml of 1% Lidocaine without Epinephrine. Subsequently, a percutaneous neurotomy was carried out for 90 seconds at 80 degrees Celsius.  After the completion of the lesion, 1 ml of injectate was delivered. It was then repeated for each facet joint nerve mentioned above. Appropriate radiographs were obtained  to verify the probe placement during the neurotomy.   Additional Comments:  The patient tolerated the procedure well Dressing: 2 x 2 sterile gauze and Band-Aid    Post-procedure details: Patient was observed during the procedure. Post-procedure instructions were reviewed.  Patient left the clinic in stable condition.       Clinical History: Acute Interface, Incoming Rad Results - 10/27/2018  4:36 PM EST HISTORY:  left radicular pain, prior surg 2017  TECHNIQUE: MRI of the lumbar spine without contrast.  COMPARISON: September 21, 2016  FINDINGS: No evidence of acute fracture or subluxation. Minimal degenerative anterolisthesis of L4 on L5. Conus terminates in normal position. Small amount of chronic STIR signal abnormality in the left L4 and L5 pedicles, similar to prior study.   Degenerative changes as follows: T12-L1: No significant stenosis L1-L2: No significant stenosis L2-L3: Small disc bulge. There is a small left paracentral disc extrusion migrated inferiorly behind the L3 vertebral body. Mild facet degenerative changes. There is mild central canal and right-sided foraminal stenosis. L3-L4: Small disc bulge. Mild facet degenerative changes. Minimal central canal stenosis. Mild bilateral foraminal stenosis. L4-L5: Broad-based disc osteophyte complex which is eccentric to the left. Facet degenerative changes. Ligamentum flavum hypertrophy. There is moderate central canal stenosis. There is moderate-severe left and mild right foraminal stenosis. Findings are  similar to prior. L5-S1: Small broad-based disc bulge and facet degenerative changes. No significant stenosis.   IMPRESSION:  1. New small left paracentral disc extrusion at L2-L3, migrated inferiorly behind the L3 vertebral body. There is associated mild canal stenosis. 2. Central canal and foraminal stenosis remaining greatest at L4-L5. Similar to prior.  Please see above comments for full  details.  Electronically Signed by: Oretha Milch     Objective:  VS:  HT:    WT:   BMI:     BP:126/78  HR:73bpm  TEMP: ( )  RESP:  Physical Exam Constitutional:      General: He is not in acute distress.    Appearance: Normal appearance. He is not ill-appearing.  HENT:     Head: Normocephalic and atraumatic.     Right Ear: External ear normal.     Left Ear: External ear normal.  Eyes:     Extraocular Movements: Extraocular movements intact.  Cardiovascular:     Rate and Rhythm: Normal rate.     Pulses: Normal pulses.  Abdominal:     General: There is no distension.     Palpations: Abdomen is soft.  Musculoskeletal:        General: No tenderness or signs of injury.     Right lower leg: No edema.     Left lower leg: No edema.     Comments: Patient has good distal strength without clonus.Patient somewhat slow to rise from a seated position to full extension.  There is concordant low back pain with facet loading and lumbar spine extension rotation.  There are no definitive trigger points but the patient is somewhat tender across the lower back and PSIS.    Skin:    Findings: No erythema or rash.  Neurological:     General: No focal deficit present.  Mental Status: He is alert and oriented to person, place, and time.     Sensory: No sensory deficit.     Motor: No weakness or abnormal muscle tone.     Coordination: Coordination normal.  Psychiatric:        Mood and Affect: Mood normal.        Behavior: Behavior normal.      Imaging: No results found.

## 2020-07-02 ENCOUNTER — Encounter: Payer: Self-pay | Admitting: Cardiovascular Disease

## 2020-07-02 ENCOUNTER — Other Ambulatory Visit: Payer: Self-pay

## 2020-07-02 ENCOUNTER — Ambulatory Visit: Payer: Medicaid Other | Admitting: Cardiovascular Disease

## 2020-07-02 VITALS — BP 128/72 | HR 52 | Ht 70.0 in | Wt 189.0 lb

## 2020-07-02 DIAGNOSIS — I82409 Acute embolism and thrombosis of unspecified deep veins of unspecified lower extremity: Secondary | ICD-10-CM

## 2020-07-02 DIAGNOSIS — E782 Mixed hyperlipidemia: Secondary | ICD-10-CM | POA: Diagnosis not present

## 2020-07-02 NOTE — Progress Notes (Signed)
Cardiology Office Note:    Date:  07/04/2020   ID:  Andrew [name], DOB [birth year], MRN [id number]  PCP:  Bernerd Limbo, MD  [facility] Cardiologist:  Sherren Mocha, MD  [place] Electrophysiologist:  None   Referring MD: Bernerd Limbo, MD   Chief Complaint  Patient presents with  . Follow-up    DVT    History of Present Illness:    Andrew [name] is a 69 y.o. male with a hx of DVT with chronic leg edema, presenting for follow-up evaluation. The patient was last seen in telehealth visit in May 2020. He has been doing well. He has chronic swelling of the right leg and this has been stable. He wears a compression sock. He is ambulating regularly and walking for exercise. He denies any chest pain, chest pressure, or shortness of breath. He denies orthopnea, PND, or heart palpitations. He denies any bleeding problems on chronic oral anticoagulation with rivaroxaban.  Past Medical History:  Diagnosis Date  . DVT (deep venous thrombosis) (HCC)    right leg in 2011; prior injury to right foot  . HLD (hyperlipidemia)   . Right foot injury     History reviewed. No pertinent surgical history.  Current Medications: Current Meds  Medication Sig  . atorvastatin (LIPITOR) 40 MG tablet TAKE 1 TABLET (40 MG TOTAL) BY MOUTH DAILY AT 6 PM.  . tiZANidine (ZANAFLEX) 4 MG tablet Take 1 tablet by mouth as needed.  . traMADol (ULTRAM) 50 MG tablet Take 1 tablet (50 mg total) by mouth every 12 (twelve) hours as needed.     Allergies:   Penicillins and Influenza vaccines   Social History   Socioeconomic History  . Marital status: Divorced    Spouse name: Not on file  . Number of children: Not on file  . Years of education: Not on file  . Highest education level: Not on file  Occupational History  . Not on file  Tobacco Use  . Smoking status: Former Smoker    Types: Cigarettes    Quit date: 07/25/1982    Years since quitting: 37.9  . Smokeless tobacco: Never Used  . Tobacco  comment: REMOTE SOCIAL HISTORY  Substance and Sexual Activity  . Alcohol use: No  . Drug use: No  . Sexual activity: Not Currently  Other Topics Concern  . Not on file  Social History Narrative  . Not on file   Social Determinants of Health   Financial Resource Strain:   . Difficulty of Paying Living Expenses:   Food Insecurity:   . Worried About Charity fundraiser in the Last Year:   . Arboriculturist in the Last Year:   Transportation Needs:   . Film/video editor (Medical):   Marland Kitchen Lack of Transportation (Non-Medical):   Physical Activity:   . Days of Exercise per Week:   . Minutes of Exercise per Session:   Stress:   . Feeling of Stress :   Social Connections:   . Frequency of Communication with Friends and Family:   . Frequency of Social Gatherings with Friends and Family:   . Attends Religious Services:   . Active Member of Clubs or Organizations:   . Attends Archivist Meetings:   Marland Kitchen Marital Status:      Family History: The patient's family history includes Heart disease in his father and mother; Vascular Disease in his maternal grandfather.  ROS:   Please see the history of present illness.  All other systems reviewed and are negative.  EKGs/Labs/Other Studies Reviewed:    EKG:  EKG is ordered today.  The ekg ordered today demonstrates sinus bradycardia 52 bpm, otherwise within normal limits  Recent Labs: No results found for requested labs within last 8760 hours.  Recent Lipid Panel    Component Value Date/Time   CHOL 170 04/08/2014 1006   TRIG 79.0 04/08/2014 1006   HDL 57.40 04/08/2014 1006   CHOLHDL 3 04/08/2014 1006   VLDL 15.8 04/08/2014 1006   LDLCALC 97 04/08/2014 1006    Physical Exam:    VS:  BP 128/72   Pulse (!) 52   Ht 5\' 10"  (1.778 m)   Wt 189 lb (85.7 kg)   SpO2 98%   BMI 27.12 kg/m     Wt Readings from Last 3 Encounters:  07/02/20 189 lb (85.7 kg)  05/28/20 189 lb (85.7 kg)  05/11/19 189 lb (85.7 kg)      GEN:  Well nourished, well developed in no acute distress HEENT: Normal NECK: No JVD; No carotid bruits LYMPHATICS: No lymphadenopathy CARDIAC: Bradycardic and regular, no murmurs, rubs, gallops RESPIRATORY:  Clear to auscultation without rales, wheezing or rhonchi  ABDOMEN: Soft, non-tender, non-distended MUSCULOSKELETAL: Right leg soft but 1+ edema noted, compression stocking in place SKIN: Warm and dry NEUROLOGIC:  Alert and oriented x 3 PSYCHIATRIC:  Normal affect   ASSESSMENT:    1. Deep vein thrombosis (DVT) of lower extremity, unspecified chronicity, unspecified laterality, unspecified vein (HCC)   2. Mixed hyperlipidemia    PLAN:    In order of problems listed above:  1. 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 He continues on oral anticoagulation and this appears to be well-tolerated. No changes are made in his medical regimen today. He was encouraged to continue his walking program. 2. Treated with a statin drug. Most recent labs reviewed. LDL cholesterol 88, transaminases normal. Continue same therapy.   Medication Adjustments/Labs and Tests Ordered: Current medicines are reviewed at length with the patient today.  Concerns regarding medicines are outlined above.  Orders Placed This Encounter  Procedures  . EKG 12-Lead   No orders of the defined types were placed in this encounter.   Patient Instructions  Medication Instructions:  Your provider recommends that you continue on your current medications as directed. Please refer to the Current Medication list given to you today.   *If you need a refill on your cardiac medications before your next appointment, please call your pharmacy*   Follow-Up: At [facility], you and your health needs are our priority.  As part of our continuing mission to provide you with exceptional heart care, we have created designated Provider Care Teams.  These Care Teams include your primary Cardiologist (physician)  and Advanced Practice Providers (APPs -  Physician Assistants and Nurse Practitioners) who all work together to provide you with the care you need, when you need it. Your next appointment:   12 month(s) The format for your next appointment:   In Person Provider:   You may see Sherren Mocha, MD or one of the following Advanced Practice Providers on your designated Care Team:    Richardson Dopp, PA-C  Robbie Lis, [place]      Signed, Sherren Mocha, MD  07/04/2020 1:53 PM    [place]

## 2020-07-02 NOTE — Patient Instructions (Signed)

## 2020-07-04 ENCOUNTER — Encounter: Payer: Self-pay | Admitting: Cardiovascular Disease

## 2020-07-08 ENCOUNTER — Other Ambulatory Visit: Payer: Self-pay

## 2020-07-08 ENCOUNTER — Ambulatory Visit (INDEPENDENT_AMBULATORY_CARE_PROVIDER_SITE_OTHER): Payer: Medicaid Other | Admitting: Family Medicine

## 2020-07-08 ENCOUNTER — Encounter: Payer: Self-pay | Admitting: Family Medicine

## 2020-07-08 DIAGNOSIS — M47816 Spondylosis without myelopathy or radiculopathy, lumbar region: Secondary | ICD-10-CM | POA: Diagnosis not present

## 2020-07-08 DIAGNOSIS — M1711 Unilateral primary osteoarthritis, right knee: Secondary | ICD-10-CM

## 2020-07-08 NOTE — Assessment & Plan Note (Signed)
Stable at this time.  No changes Zanaflex as needed

## 2020-07-08 NOTE — Progress Notes (Signed)
[place] Phone: [phone] Subjective:   Andrew [name], am serving as a scribe for Dr. Hulan Saas. This visit occurred during the SARS-CoV-2 public health emergency.  Safety protocols were in place, including screening questions prior to the visit, additional usage of staff PPE, and extensive cleaning of exam room while observing appropriate contact time as indicated for disinfecting solutions.   I'm seeing this patient by the request  of:  Bernerd Limbo, MD  CC: Low back pain and knee pain follow-up  ZRA:[id number]   05/28/2020 Degenerative right knee.  Discussed with patient in great length.  Discussed icing regimen and home exercises.  Discussed which activities to do which wants to avoid.  Patient could be a candidate for viscosupplementation and we will see if we can get this approved.  Otherwise patient will need serial injections of steroid to help with some of them chronic swelling.  Patient wants to avoid any type of surgical intervention at the moment.  Follow-up again in 4 to 8 weeks  Update 4/56/2563 Andrew [name] is a 69 y.o. male coming in with complaint of right knee pain. Patient states that his pain has improved. Still has pain intermittently. Using knee sleeve.  Patient would state feeling approximately 50 to 60% better.  Feels like the vitamin supplementations have been helpful.  Patient has noticed leg swelling has improved as well.  Only using the muscle relaxer as needed.     Past Medical History:  Diagnosis Date  . DVT (deep venous thrombosis) (HCC)    right leg in 2011; prior injury to right foot  . HLD (hyperlipidemia)   . Right foot injury    [name] past surgical history on file. Social History   Socioeconomic History  . Marital status: Divorced    Spouse name: Not on file  . Number of children: Not on file  . Years of education: Not on file  . Highest education level: Not on file    Occupational History  . Not on file  Tobacco Use  . Smoking status: Former Smoker    Types: Cigarettes    Quit date: 07/25/1982    Years since quitting: 37.9  . Smokeless tobacco: Never Used  . Tobacco comment: REMOTE SOCIAL HISTORY  Substance and Sexual Activity  . Alcohol use: [name]  . Drug use: [name]  . Sexual activity: Not Currently  Other Topics Concern  . Not on file  Social History Narrative  . Not on file   Social Determinants of Health   Financial Resource Strain:   . Difficulty of Paying Living Expenses:   Food Insecurity:   . Worried About Charity fundraiser in the Last Year:   . Arboriculturist in the Last Year:   Transportation Needs:   . Film/video editor (Medical):   Marland Kitchen Lack of Transportation (Non-Medical):   Physical Activity:   . Days of Exercise per Week:   . Minutes of Exercise per Session:   Stress:   . Feeling of Stress :   Social Connections:   . Frequency of Communication with Friends and Family:   . Frequency of Social Gatherings with Friends and Family:   . Attends Religious Services:   . Active Member of Clubs or Organizations:   . Attends Archivist Meetings:   Marland Kitchen Marital Status:    Allergies  Allergen Reactions  . Penicillins Anaphylaxis  . Influenza Vaccines  Broke out in hives and had breathing problems   Family History  Problem Relation Age of Onset  . Heart disease Mother   . Heart disease Father   . Vascular Disease Maternal Grandfather      Current Outpatient Medications (Cardiovascular):  .  atorvastatin (LIPITOR) 40 MG tablet, TAKE 1 TABLET (40 MG TOTAL) BY MOUTH DAILY AT 6 PM.   Current Outpatient Medications (Analgesics):  .  traMADol (ULTRAM) 50 MG tablet, Take 1 tablet (50 mg total) by mouth every 12 (twelve) hours as needed.  Current Outpatient Medications (Hematological):  .  rivaroxaban (XARELTO) 10 MG TABS tablet, Take 1 tablet (10 mg total) by mouth daily.  Current Outpatient Medications (Other):   .  tiZANidine (ZANAFLEX) 4 MG tablet, Take 1 tablet by mouth as needed.   Reviewed prior external information including notes and imaging from  primary care provider As well as notes that were available from care everywhere and other healthcare systems.  Past medical history, social, surgical and family history all reviewed in electronic medical record.  [name] pertanent information unless stated regarding to the chief complaint.   Review of Systems:  [name] headache, visual changes, nausea, vomiting, diarrhea, constipation, dizziness, abdominal pain, skin rash, fevers, chills, night sweats, weight loss, swollen lymph nodes, body aches, joint swelling, chest pain, shortness of breath, mood changes. POSITIVE muscle aches  Objective  Blood pressure 118/78, pulse 69, height 5\' 10"  (1.778 m), weight 192 lb (87.1 kg), SpO2 96 %.   General: [name] apparent distress alert and oriented x3 mood and affect normal, dressed appropriately.  HEENT: Pupils equal, extraocular movements intact  Respiratory: Patient's speak in full sentences and does not appear short of breath  Cardiovascular: [name] lower extremity edema, non tender, [name] erythema  Neuro: Cranial nerves II through XII are intact, neurovascularly intact in all extremities with 2+ DTRs and 2+ pulses.  Gait very mildly antalgic Right knee exam does have some degenerative changes still noted.  Mild positive patellar grind still noted.  Instability noted with valgus and varus force noted.    Impression and Recommendations:     The above documentation has been reviewed and is accurate and complete Andrew Pulley, DO       Note: This dictation was prepared with Dragon dictation along with smaller phrase technology. Any transcriptional errors that result from this process are unintentional.

## 2020-07-08 NOTE — Assessment & Plan Note (Signed)
Knee arthritis.  Discussed with patient in great length about icing regimen and home exercise, we discussed avoiding certain activities.  Patient will increase activity slowly.  As long as patient does well continue conservative therapy worsening symptoms we will start with steroid injections and then consider the viscosupplementation.  Follow-up again in 2 to 3 months

## 2020-07-08 NOTE — Patient Instructions (Signed)
Doing great!  Follow up in 2-3 months

## 2020-07-10 ENCOUNTER — Other Ambulatory Visit: Payer: Self-pay | Admitting: Cardiovascular Disease

## 2020-09-17 ENCOUNTER — Other Ambulatory Visit: Payer: Self-pay

## 2020-09-17 ENCOUNTER — Encounter: Payer: Self-pay | Admitting: Family Medicine

## 2020-09-17 ENCOUNTER — Ambulatory Visit (INDEPENDENT_AMBULATORY_CARE_PROVIDER_SITE_OTHER): Payer: Medicare Other | Admitting: Family Medicine

## 2020-09-17 DIAGNOSIS — M1711 Unilateral primary osteoarthritis, right knee: Secondary | ICD-10-CM

## 2020-09-17 NOTE — Assessment & Plan Note (Signed)
Right knee does have arthritic changes but is fairly well.  We have discussed again about the possibility of injection or aspiration which patient has declined.  Continue with the brace, icing regimen, topical anti-inflammatories, follow-up again in 2 to 3 months if having any worsening pain.

## 2020-09-17 NOTE — Patient Instructions (Signed)
Keep doing everything  See me in 2 months

## 2020-09-17 NOTE — Progress Notes (Signed)
[place] Phone: [phone] Subjective:   Andrew [name], am serving as a scribe for Dr. Hulan Saas. This visit occurred during the SARS-CoV-2 public health emergency.  Safety protocols were in place, including screening questions prior to the visit, additional usage of staff PPE, and extensive cleaning of exam room while observing appropriate contact time as indicated for disinfecting solutions.   I'm seeing this patient by the request  of:  Bernerd Limbo, MD  CC: Knee pain follow-up  [id number]   07/08/2020 Knee arthritis.  Discussed with patient in great length about icing regimen and home exercise, we discussed avoiding certain activities.  Patient will increase activity slowly.  As long as patient does well continue conservative therapy worsening symptoms we will start with steroid injections and then consider the viscosupplementation.  Follow-up again in 2 to 3 months  Stable at this time.  [name] changes Zanaflex as needed  Update 84/04/3645 Andrew [name] is a 69 y.o. male coming in with complaint of right knee pain. Pain only 2 or 3 days a week. Overall feels improved. Is using brace, turmeric, and vit D. Also using HOKA shoes.  [name] new symptoms.     Past Medical History:  Diagnosis Date  . DVT (deep venous thrombosis) (HCC)    right leg in 2011; prior injury to right foot  . HLD (hyperlipidemia)   . Right foot injury    [name] past surgical history on file. Social History   Socioeconomic History  . Marital status: Divorced    Spouse name: Not on file  . Number of children: Not on file  . Years of education: Not on file  . Highest education level: Not on file  Occupational History  . Not on file  Tobacco Use  . Smoking status: Former Smoker    Types: Cigarettes    Quit date: 07/25/1982    Years since quitting: 38.1  . Smokeless tobacco: Never Used  . Tobacco comment: REMOTE SOCIAL HISTORY    Substance and Sexual Activity  . Alcohol use: [name]  . Drug use: [name]  . Sexual activity: Not Currently  Other Topics Concern  . Not on file  Social History Narrative  . Not on file   Social Determinants of Health   Financial Resource Strain:   . Difficulty of Paying Living Expenses: Not on file  Food Insecurity:   . Worried About Charity fundraiser in the Last Year: Not on file  . Ran Out of Food in the Last Year: Not on file  Transportation Needs:   . Lack of Transportation (Medical): Not on file  . Lack of Transportation (Non-Medical): Not on file  Physical Activity:   . Days of Exercise per Week: Not on file  . Minutes of Exercise per Session: Not on file  Stress:   . Feeling of Stress : Not on file  Social Connections:   . Frequency of Communication with Friends and Family: Not on file  . Frequency of Social Gatherings with Friends and Family: Not on file  . Attends Religious Services: Not on file  . Active Member of Clubs or Organizations: Not on file  . Attends Archivist Meetings: Not on file  . Marital Status: Not on file   Allergies  Allergen Reactions  . Penicillins Anaphylaxis  . Influenza Vaccines     Broke out in hives and had breathing problems   Family History  Problem Relation  Age of Onset  . Heart disease Mother   . Heart disease Father   . Vascular Disease Maternal Grandfather      Current Outpatient Medications (Cardiovascular):  .  atorvastatin (LIPITOR) 40 MG tablet, TAKE 1 TABLET (40 MG TOTAL) BY MOUTH DAILY AT 6 PM.   Current Outpatient Medications (Analgesics):  .  traMADol (ULTRAM) 50 MG tablet, Take 1 tablet (50 mg total) by mouth every 12 (twelve) hours as needed.  Current Outpatient Medications (Hematological):  .  rivaroxaban (XARELTO) 10 MG TABS tablet, Take 1 tablet (10 mg total) by mouth daily.  Current Outpatient Medications (Other):  .  tiZANidine (ZANAFLEX) 4 MG tablet, Take 1 tablet by mouth as needed.   Reviewed  prior external information including notes and imaging from  primary care provider As well as notes that were available from care everywhere and other healthcare systems.  Past medical history, social, surgical and family history all reviewed in electronic medical record.  [name] pertanent information unless stated regarding to the chief complaint.   Review of Systems:  [name] headache, visual changes, nausea, vomiting, diarrhea, constipation, dizziness, abdominal pain, skin rash, fevers, chills, night sweats, weight loss, swollen lymph nodes, body aches, joint swelling, chest pain, shortness of breath, mood changes. POSITIVE muscle aches  Objective  Blood pressure 116/74, pulse 71, height 5\' 10"  (1.778 m), weight 192 lb (87.1 kg), SpO2 95 %.   General: Patient is accompanied with brother, appears alert and oriented speaks home. HEENT: Pupils equal, extraocular movements intact  Respiratory: Patient's speak in full sentences and does not appear short of breath  Cardiovascular: 1+ lower extremity edema, non tender, [name] erythema  Gait mildly antalgic Right knee exam does show some mild effusion noted.  Patient has some mild instability with valgus and varus force.  Lacks last 10 degrees of flexion.  Continues to have some edema of the right lower extremity from patient's chronic DVT   Impression and Recommendations:     The above documentation has been reviewed and is accurate and complete Lyndal Pulley, DO

## 2020-11-19 ENCOUNTER — Encounter: Payer: Self-pay | Admitting: Family Medicine

## 2020-11-19 ENCOUNTER — Other Ambulatory Visit: Payer: Self-pay

## 2020-11-19 ENCOUNTER — Ambulatory Visit (INDEPENDENT_AMBULATORY_CARE_PROVIDER_SITE_OTHER): Payer: Medicare Other | Admitting: Family Medicine

## 2020-11-19 DIAGNOSIS — M170 Bilateral primary osteoarthritis of knee: Secondary | ICD-10-CM | POA: Diagnosis not present

## 2020-11-19 MED ORDER — ALLOPURINOL 100 MG PO TABS
100.0000 mg | ORAL_TABLET | Freq: Every day | ORAL | 3 refills | Status: DC
Start: 2020-11-19 — End: 2021-09-30

## 2020-11-19 NOTE — Progress Notes (Signed)
[place] [address] [place] Phone: [phone] Subjective:   I Andrew [name] am serving as a Education administrator for Dr. Hulan Saas.  This visit occurred during the SARS-CoV-2 public health emergency.  Safety protocols were in place, including screening questions prior to the visit, additional usage of staff PPE, and extensive cleaning of exam room while observing appropriate contact time as indicated for disinfecting solutions.   I'm seeing this patient by the request  of:  Bernerd Limbo, MD  CC: knee pain   IOE:[id number]   09/17/2020 Right knee does have arthritic changes but is fairly well.  We have discussed again about the possibility of injection or aspiration which patient has declined.  Continue with the brace, icing regimen, topical anti-inflammatories, follow-up again in 2 to 3 months if having any worsening pain.  82/08/9370 Andrew [name] is a 69 y.o. male coming in with complaint of right knee pain. Right is making progress. Left side is painful today. 8/10 pain. Believes it is due to compensation. Started more pain on the medial aspect.   Patient did have x-rays at last exam showing the patient did have degenerative changes of the knees bilaterally especially with being chondrocalcinosis.    Past Medical History:  Diagnosis Date  . DVT (deep venous thrombosis) (HCC)    right leg in 2011; prior injury to right foot  . HLD (hyperlipidemia)   . Right foot injury    No past surgical history on file. Social History   Socioeconomic History  . Marital status: Divorced    Spouse name: Not on file  . Number of children: Not on file  . Years of education: Not on file  . Highest education level: Not on file  Occupational History  . Not on file  Tobacco Use  . Smoking status: Former Smoker    Types: Cigarettes    Quit date: 07/25/1982    Years since quitting: 38.3  . Smokeless tobacco: Never Used  . Tobacco comment: REMOTE  SOCIAL HISTORY  Substance and Sexual Activity  . Alcohol use: No  . Drug use: No  . Sexual activity: Not Currently  Other Topics Concern  . Not on file  Social History Narrative  . Not on file   Social Determinants of Health   Financial Resource Strain:   . Difficulty of Paying Living Expenses: Not on file  Food Insecurity:   . Worried About Charity fundraiser in the Last Year: Not on file  . Ran Out of Food in the Last Year: Not on file  Transportation Needs:   . Lack of Transportation (Medical): Not on file  . Lack of Transportation (Non-Medical): Not on file  Physical Activity:   . Days of Exercise per Week: Not on file  . Minutes of Exercise per Session: Not on file  Stress:   . Feeling of Stress : Not on file  Social Connections:   . Frequency of Communication with Friends and Family: Not on file  . Frequency of Social Gatherings with Friends and Family: Not on file  . Attends Religious Services: Not on file  . Active Member of Clubs or Organizations: Not on file  . Attends Archivist Meetings: Not on file  . Marital Status: Not on file   Allergies  Allergen Reactions  . Penicillins Anaphylaxis  . Influenza Vaccines     Broke out in hives and had breathing problems   Family History  Problem Relation Age  of Onset  . Heart disease Mother   . Heart disease Father   . Vascular Disease Maternal Grandfather      Current Outpatient Medications (Cardiovascular):  .  atorvastatin (LIPITOR) 40 MG tablet, TAKE 1 TABLET (40 MG TOTAL) BY MOUTH DAILY AT 6 PM.   Current Outpatient Medications (Analgesics):  .  traMADol (ULTRAM) 50 MG tablet, Take 1 tablet (50 mg total) by mouth every 12 (twelve) hours as needed. Marland Kitchen  allopurinol (ZYLOPRIM) 100 MG tablet, Take 1 tablet (100 mg total) by mouth daily.  Current Outpatient Medications (Hematological):  .  rivaroxaban (XARELTO) 10 MG TABS tablet, Take 1 tablet (10 mg total) by mouth daily.  Current Outpatient  Medications (Other):  .  tiZANidine (ZANAFLEX) 4 MG tablet, Take 1 tablet by mouth as needed.   Reviewed prior external information including notes and imaging from  primary care provider As well as notes that were available from care everywhere and other healthcare systems.  Past medical history, social, surgical and family history all reviewed in electronic medical record.  No pertanent information unless stated regarding to the chief complaint.   Review of Systems:  No headache, visual changes, nausea, vomiting, diarrhea, constipation, dizziness, abdominal pain, skin rash, fevers, chills, night sweats, weight loss, swollen lymph nodes, body aches,  chest pain, shortness of breath, mood changes. POSITIVE muscle aches, joint swelling   Objective  Blood pressure 100/60, pulse 74, height 5\' 10"  (1.778 m), weight 196 lb (88.9 kg), SpO2 93 %.   General: No apparent distress alert and oriented x3 mood and affect normal, dressed appropriately.  HEENT: Pupils equal, extraocular movements intact  Respiratory: Patient's speak in full sentences and does not appear short of breath  Cardiovascular: No lower extremity edema, non tender, no erythema  Patient does have trace swelling noted of the lower extremities.  Patient known does have mild swelling on the left knee.  Patient does have full range of motion.  Moderately tender over the medial joint space.  Very mild osteoarthritic changes and mild instability with valgus and varus force    Impression and Recommendations:    The above documentation has been reviewed and is accurate and complete Lyndal Pulley, DO

## 2020-11-19 NOTE — Patient Instructions (Addendum)
Allopurinol 200 mg daily Ok to do exercises but keep feet planted Compression sleeve with a lot of activity Ice after activity See me again in 4-6 weeks to discuss injection

## 2020-11-19 NOTE — Assessment & Plan Note (Signed)
Bilateral. Underlying chondrocalcinosis could be contributing as well.  Started on low-dose of allopurinol.  Warned of potential side effects but hopefully patient will do well.  We discussed the potential for injection.  Patient wants to avoid that.  No pain in the calf and is on the blood thinner.  Worsening pain will consider injection at follow-up in 4 to 6 weeks

## 2020-12-18 ENCOUNTER — Other Ambulatory Visit: Payer: Self-pay

## 2020-12-18 ENCOUNTER — Encounter: Payer: Self-pay | Admitting: Family Medicine

## 2020-12-18 ENCOUNTER — Ambulatory Visit (INDEPENDENT_AMBULATORY_CARE_PROVIDER_SITE_OTHER): Payer: Medicare Other | Admitting: Family Medicine

## 2020-12-18 VITALS — BP 140/90 | HR 64 | Ht 70.0 in | Wt 195.0 lb

## 2020-12-18 DIAGNOSIS — M170 Bilateral primary osteoarthritis of knee: Secondary | ICD-10-CM

## 2020-12-18 DIAGNOSIS — M25562 Pain in left knee: Secondary | ICD-10-CM | POA: Diagnosis not present

## 2020-12-18 DIAGNOSIS — G8929 Other chronic pain: Secondary | ICD-10-CM

## 2020-12-18 NOTE — Progress Notes (Signed)
[name] [address] [place] [address] Phone: [phone] Subjective:   I Andrew [name] am serving as a Neurosurgeon for Dr. Antoine Primas.  This visit occurred during the SARS-CoV-2 public health emergency.  Safety protocols were in place, including screening questions prior to the visit, additional usage of staff PPE, and extensive cleaning of exam room while observing appropriate contact time as indicated for disinfecting solutions.   I'm seeing this patient by the request  of:  Tracey Harries, MD  CC: Knee pain follow-up  [id number]   11/19/2020 Bilateral. Underlying chondrocalcinosis could be contributing as well.  Started on low-dose of allopurinol.  Warned of potential side effects but hopefully patient will do well.  We discussed the potential for injection.  Patient wants to avoid that.  No pain in the calf and is on the blood thinner.  Worsening pain will consider injection at follow-up in 4 to 6 weeks  12/18/2020 Andrew [name] is a 70 y.o. male coming in with complaint of bilateral knee pain. Patient states the left is painful. Patient still does not want injections.  Patient states that the left knee still seems to be most painful.  Patient states that 10 years ago did have a surgical intervention on the right knee and had significant improvement and is wondering if that is possible with this left knee.  Patient does have x-rays that have been independently visualized by me showing the patient does have diffuse arthritic changes moderate in nature.  Significant chondrocalcinosis.  Allopurinol may have helped a little bit but not a significant amount.  Still has pain on a daily basis.    Past Medical History:  Diagnosis Date  . DVT (deep venous thrombosis) (HCC)    right leg in 2011; prior injury to right foot  . HLD (hyperlipidemia)   . Right foot injury    No past surgical history on file. Social History   Socioeconomic History  .  Marital status: Divorced    Spouse name: Not on file  . Number of children: Not on file  . Years of education: Not on file  . Highest education level: Not on file  Occupational History  . Not on file  Tobacco Use  . Smoking status: Former Smoker    Types: Cigarettes    Quit date: 07/25/1982    Years since quitting: 38.4  . Smokeless tobacco: Never Used  . Tobacco comment: REMOTE SOCIAL HISTORY  Substance and Sexual Activity  . Alcohol use: No  . Drug use: No  . Sexual activity: Not Currently  Other Topics Concern  . Not on file  Social History Narrative  . Not on file   Social Determinants of Health   Financial Resource Strain: Not on file  Food Insecurity: Not on file  Transportation Needs: Not on file  Physical Activity: Not on file  Stress: Not on file  Social Connections: Not on file   Allergies  Allergen Reactions  . Penicillins Anaphylaxis  . Influenza Vaccines     Broke out in hives and had breathing problems   Family History  Problem Relation Age of Onset  . Heart disease Mother   . Heart disease Father   . Vascular Disease Maternal Grandfather      Current Outpatient Medications (Cardiovascular):  .  atorvastatin (LIPITOR) 40 MG tablet, TAKE 1 TABLET (40 MG TOTAL) BY MOUTH DAILY AT 6 PM.   Current Outpatient Medications (Analgesics):  .  allopurinol (ZYLOPRIM) 100 MG  tablet, Take 1 tablet (100 mg total) by mouth daily. .  traMADol (ULTRAM) 50 MG tablet, Take 1 tablet (50 mg total) by mouth every 12 (twelve) hours as needed.  Current Outpatient Medications (Hematological):  .  rivaroxaban (XARELTO) 10 MG TABS tablet, Take 1 tablet (10 mg total) by mouth daily.  Current Outpatient Medications (Other):  .  tiZANidine (ZANAFLEX) 4 MG tablet, Take 1 tablet by mouth as needed.   Reviewed prior external information including notes and imaging from  primary care provider As well as notes that were available from care everywhere and other healthcare  systems.  Past medical history, social, surgical and family history all reviewed in electronic medical record.  No pertanent information unless stated regarding to the chief complaint.   Review of Systems:  No headache, visual changes, nausea, vomiting, diarrhea, constipation, dizziness, abdominal pain, skin rash, fevers, chills, night sweats, weight loss, swollen lymph nodes, body aches, joint swelling, chest pain, shortness of breath, mood changes. POSITIVE muscle aches  Objective  Blood pressure 140/90, pulse 64, height 5\' 10"  (1.778 m), weight 195 lb (88.5 kg), SpO2 96 %.   General: No apparent distress alert and oriented x3 mood and affect normal, dressed appropriately.  HEENT: Pupils equal, extraocular movements intact  Respiratory: Patient's speak in full sentences and does not appear short of breath  Cardiovascular: 1+ but symmetric Left knee exam does have some instability with valgus and varus force.  Patient does have mild positive McMurray's on the left.  Tender to palpation over the medial joint line.  Mild lateral tracking of the patella noted.  Mild abnormal thigh to calf ratio.    Impression and Recommendations:     The above documentation has been reviewed and is accurate and complete Lyndal Pulley, DO

## 2020-12-18 NOTE — Patient Instructions (Addendum)
Good to see you Left knee mri Based on imaging we will discuss surgery We will contact you in mychart about results

## 2020-12-18 NOTE — Assessment & Plan Note (Signed)
Patient does have chondrocalcinosis.  Moderate arthritic changes noted on x-ray as well as on ultrasound previously.  Patient has responded somewhat to the allopurinol but still having pain on a daily basis.  Patient's past medical history is significant for a meniscal surgery on the contralateral side 10 years ago and would like to have that type of potential improvement with surgery he would like that.  Patient does not want any other injections.  Patient has done physical therapy at this point, topical anti-inflammatories, home exercises, medications with very minimal improvement patient can follow-up after the imaging and will discuss further at that time.

## 2021-01-10 ENCOUNTER — Other Ambulatory Visit: Payer: Self-pay

## 2021-01-10 ENCOUNTER — Ambulatory Visit (INDEPENDENT_AMBULATORY_CARE_PROVIDER_SITE_OTHER): Payer: Medicare Other

## 2021-01-10 DIAGNOSIS — M25562 Pain in left knee: Secondary | ICD-10-CM

## 2021-01-10 DIAGNOSIS — M7122 Synovial cyst of popliteal space [Baker], left knee: Secondary | ICD-10-CM | POA: Diagnosis not present

## 2021-01-10 DIAGNOSIS — G8929 Other chronic pain: Secondary | ICD-10-CM

## 2021-01-10 DIAGNOSIS — S83242A Other tear of medial meniscus, current injury, left knee, initial encounter: Secondary | ICD-10-CM | POA: Diagnosis not present

## 2021-01-10 DIAGNOSIS — M25462 Effusion, left knee: Secondary | ICD-10-CM

## 2021-01-10 DIAGNOSIS — M11262 Other chondrocalcinosis, left knee: Secondary | ICD-10-CM | POA: Diagnosis not present

## 2021-01-12 ENCOUNTER — Encounter: Payer: Self-pay | Admitting: Family Medicine

## 2021-01-13 ENCOUNTER — Other Ambulatory Visit: Payer: Self-pay

## 2021-01-13 DIAGNOSIS — S83207A Unspecified tear of unspecified meniscus, current injury, left knee, initial encounter: Secondary | ICD-10-CM

## 2021-01-16 ENCOUNTER — Other Ambulatory Visit: Payer: Self-pay

## 2021-01-16 ENCOUNTER — Ambulatory Visit (INDEPENDENT_AMBULATORY_CARE_PROVIDER_SITE_OTHER): Payer: Medicare Other

## 2021-01-16 ENCOUNTER — Ambulatory Visit (INDEPENDENT_AMBULATORY_CARE_PROVIDER_SITE_OTHER): Payer: Medicare Other | Admitting: Family Medicine

## 2021-01-16 ENCOUNTER — Encounter: Payer: Self-pay | Admitting: Family Medicine

## 2021-01-16 VITALS — BP 98/60 | HR 65 | Ht 70.0 in | Wt 173.0 lb

## 2021-01-16 DIAGNOSIS — M542 Cervicalgia: Secondary | ICD-10-CM

## 2021-01-16 DIAGNOSIS — M5030 Other cervical disc degeneration, unspecified cervical region: Secondary | ICD-10-CM

## 2021-01-16 DIAGNOSIS — M25511 Pain in right shoulder: Secondary | ICD-10-CM | POA: Diagnosis not present

## 2021-01-16 DIAGNOSIS — M5412 Radiculopathy, cervical region: Secondary | ICD-10-CM

## 2021-01-16 DIAGNOSIS — G8929 Other chronic pain: Secondary | ICD-10-CM

## 2021-01-16 MED ORDER — GABAPENTIN 100 MG PO CAPS: 200.0000 mg | ORAL_CAPSULE | Freq: Every day | ORAL | 3 refills | Status: DC

## 2021-01-16 NOTE — Progress Notes (Signed)
[place] Phone: [phone] Subjective:   [name], am serving as a scribe for Dr. Hulan Saas. This visit occurred during the SARS-CoV-2 public health emergency.  Safety protocols were in place, including screening questions prior to the visit, additional usage of staff PPE, and extensive cleaning of exam room while observing appropriate contact time as indicated for disinfecting solutions.   I'm seeing this patient by the request  of:  Bernerd Limbo, MD  CC: Shoulder pain  OYD:XAJOINOMVE  Andrew [name] is a 70 y.o. male coming in with complaint of neck and shoulder pain for last summer. Did have something land on top of his shoulder but is unsure if this was cause of pain.  Pain is dull and achy. Radiates down both shoulders to the elbow. Using NSAIDs, heating pads, topical analgesics and massage for pain.  Patient is accompanied with brother who helps with translation.  States that he has noticed that he has been more tight recently.  Using his arm less as well as moving his neck less.  No radiation to the hands but does go to the elbows.  Can be uncomfortable at night as well.     Past Medical History:  Diagnosis Date  . DVT (deep venous thrombosis) (HCC)    right leg in 2011; prior injury to right foot  . HLD (hyperlipidemia)   . Right foot injury    No past surgical history on file. Social History   Socioeconomic History  . Marital status: Divorced    Spouse name: Not on file  . Number of children: Not on file  . Years of education: Not on file  . Highest education level: Not on file  Occupational History  . Not on file  Tobacco Use  . Smoking status: Former Smoker    Types: Cigarettes    Quit date: 07/25/1982    Years since quitting: 38.5  . Smokeless tobacco: Never Used  . Tobacco comment: REMOTE SOCIAL HISTORY  Substance and Sexual Activity  . Alcohol use: No  . Drug use: No  .  Sexual activity: Not Currently  Other Topics Concern  . Not on file  Social History Narrative  . Not on file   Social Determinants of Health   Financial Resource Strain: Not on file  Food Insecurity: Not on file  Transportation Needs: Not on file  Physical Activity: Not on file  Stress: Not on file  Social Connections: Not on file   Allergies  Allergen Reactions  . Penicillins Anaphylaxis  . Influenza Vaccines     Broke out in hives and had breathing problems   Family History  Problem Relation Age of Onset  . Heart disease Mother   . Heart disease Father   . Vascular Disease Maternal Grandfather      Current Outpatient Medications (Cardiovascular):  .  atorvastatin (LIPITOR) 40 MG tablet, TAKE 1 TABLET (40 MG TOTAL) BY MOUTH DAILY AT 6 PM.   Current Outpatient Medications (Analgesics):  .  allopurinol (ZYLOPRIM) 100 MG tablet, Take 1 tablet (100 mg total) by mouth daily. .  traMADol (ULTRAM) 50 MG tablet, Take 1 tablet (50 mg total) by mouth every 12 (twelve) hours as needed.  Current Outpatient Medications (Hematological):  .  rivaroxaban (XARELTO) 10 MG TABS tablet, Take 1 tablet (10 mg total) by mouth daily.  Current Outpatient Medications (Other):  .  gabapentin (NEURONTIN) 100 MG capsule, Take 2 capsules (200 mg  total) by mouth at bedtime. Marland Kitchen  tiZANidine (ZANAFLEX) 4 MG tablet, Take 1 tablet by mouth as needed.   Reviewed prior external information including notes and imaging from  primary care provider As well as notes that were available from care everywhere and other healthcare systems.  Past medical history, social, surgical and family history all reviewed in electronic medical record.  No pertanent information unless stated regarding to the chief complaint.   Review of Systems:  No headache, visual changes, nausea, vomiting, diarrhea, constipation, dizziness, abdominal pain, skin rash, fevers, chills, night sweats, weight loss, swollen lymph nodes, body  aches, joint swelling, chest pain, shortness of breath, mood changes. POSITIVE muscle aches  Objective  Blood pressure 98/60, pulse 65, height 5\' 10"  (1.778 m), weight 173 lb (78.5 kg), SpO2 96 %.   General: No apparent distress alert and oriented x3 mood and affect normal, dressed appropriately.  HEENT: Pupils equal, extraocular movements intact  Respiratory: Patient's speak in full sentences and does not appear short of breath  Cardiovascular: Chronic swelling noted of the left right lower extremity Gait antalgic Neck exam has significant decrease in extension of the neck with only 5 degrees.  Worsening pain.  Mild positive radicular symptoms with Spurling's on the right side in the C4-C5 distribution.  Patient's right shoulder though does have positive impingement noted with Neer and Hawkins.  Rotator cuff strength 4 out of 5 compared to the contralateral side.  Some limited external range of motion of the shoulder.     Impression and Recommendations:     The above documentation has been reviewed and is accurate and complete Lyndal Pulley, DO

## 2021-01-16 NOTE — Assessment & Plan Note (Signed)
Patient is in moderate degenerative cervical arthritis likely.  X-rays are pending.  Patient has had many different comorbidities over the course of time. Discussed that likely this is causing some of the radicular symptoms.  Started on gabapentin.  We have done for his lower back previously and we will see if this makes any improvement.  Discussed potential side effects.  Patient is accompanied with brother who did help with translation.  Follow-up with me again in 6 weeks

## 2021-01-16 NOTE — Patient Instructions (Addendum)
Great to see you  Ice 20 minutes 2 times daily. Usually after activity and before bed. Would do more of the shoulders then the neck  Keep hands within peripheral vision  Xrays today  Gabapentin 200mg  at night  PT [facility] and they should call you soon  Good luck with the knee  See me again in 6 weeks

## 2021-01-16 NOTE — Assessment & Plan Note (Signed)
Patient is having right shoulder pain as well.  Could be more secondary to cervical radiculopathy but does have some positive impingement noted.  We will get x-rays to further evaluate.  Patient given home exercises to work on scapular strengthening.  Patient has had muscle relaxers previously that could be beneficial as well.  Discussed icing regimen.  Discussed formal physical therapy and will be referred today.  Follow-up again 4 to 8 weeks.

## 2021-01-19 ENCOUNTER — Encounter: Payer: Self-pay | Admitting: Family Medicine

## 2021-01-20 ENCOUNTER — Ambulatory Visit (INDEPENDENT_AMBULATORY_CARE_PROVIDER_SITE_OTHER): Payer: Medicare Other | Admitting: Surgery

## 2021-01-20 ENCOUNTER — Encounter: Payer: Self-pay | Admitting: Surgery

## 2021-01-20 ENCOUNTER — Other Ambulatory Visit: Payer: Self-pay

## 2021-01-20 VITALS — BP 121/69 | HR 68 | Ht 70.0 in | Wt 195.0 lb

## 2021-01-20 DIAGNOSIS — M4722 Other spondylosis with radiculopathy, cervical region: Secondary | ICD-10-CM

## 2021-01-20 DIAGNOSIS — M5412 Radiculopathy, cervical region: Secondary | ICD-10-CM | POA: Diagnosis not present

## 2021-01-20 MED ORDER — TRAMADOL HCL 50 MG PO TABS: 50.0000 mg | ORAL_TABLET | Freq: Three times a day (TID) | ORAL | 0 refills | Status: DC | PRN

## 2021-01-20 MED ORDER — METHYLPREDNISOLONE 4 MG PO TABS: ORAL_TABLET | ORAL | 0 refills | Status: DC

## 2021-01-20 NOTE — Progress Notes (Signed)
Office Visit Note   Patient: Andrew [name]           Date of Birth: [birth year]           MRN: [id number] Visit Date: 01/20/2021              Requested by: Bernerd Limbo, MD [place],  [place] [id number] PCP: Bernerd Limbo, MD   Assessment & Plan: Visit Diagnoses:  1. Radiculopathy, cervical region   2. Other spondylosis with radiculopathy, cervical region     Plan: Since patient continues have ongoing worsening neck pain and right greater than left upper extremity radiculopathy that is failed conservative treatment I will proceed with getting cervical MRI to rule out HNP/stenosis.  We will have patient follow-up with Dr. Louanne Skye in 2 weeks for recheck to discuss results and further treatment options.  In hopes of giving him some improvement in the clinic today he was given Toradol 30 mg and Depo-Medrol 80 mg IM injections.  Also sent in prescriptions for Ultram 50 mg 1 tab p.o. every 8 hours as needed for pain and also Medrol Dosepak 6-day taper to be taken as directed to his pharmacy.  All questions answered.  Patient's son was present during the entire visit.  Follow-Up Instructions: Return in about 2 weeks (around 02/03/2021) for With Dr. Louanne Skye to review cervical MRI.   Orders:  Orders Placed This Encounter  Procedures  . MR Cervical Spine w/o contrast   No orders of the defined types were placed in this encounter.     Procedures: No procedures performed   Clinical Data: No additional findings.   Subjective: Chief Complaint  Patient presents with  . Neck - Pain    HPI 70 year old male comes in with his son today with complaints of worsening neck pain and right greater than left upper extremity radiculopathy.  Son states that symptoms have been ongoing for a couple of months.  No injury.  He has pain radiating from the neck to the base of his skull, right greater than left shoulder blades and down both arms to the elbows.  Has been getting  some headaches.  Also feels like his right arm is getting weaker.  He has had some visits with [facility] Dr. Hulan Saas.  He has had conservative management with oral NSAIDs, gabapentin and has been referred to physical therapy.  Dr. Tamala Julian had mentioned ordering a cervical MRI but patient had a visit scheduled with [place] so he decided to wait.  He describes his pain as being constant.  Patient is cervical spine x-ray January 16, 2021 and report showed:  EXAM: CERVICAL SPINE - 2-3 VIEW  COMPARISON:  None.  FINDINGS: The cervical spine is only well seen to the bottom of C6. C7 is partially visualized but partially obscured by soft tissues. There is reversal of normal lordosis. No other malalignment. Degenerative changes are seen most marked at C5-6 and C6-7. No fractures are identified. The pre odontoid space and prevertebral soft tissues are normal. Lung apices are normal.  IMPRESSION: The cervical spine is only well seen to the bottom of C6. C7 is partially obscured by soft tissues. No fracture or traumatic malalignment. Degenerative changes.   Electronically Signed   By: Dorise Bullion III M.D   On: 01/16/2021 17:19    Review of Systems No current complaints of cardiac pulmonary GI GU issues  Objective: Vital Signs: BP 121/69   Pulse 68   Ht 5'  10" (1.778 m)   Wt 195 lb (88.5 kg)   BMI 27.98 kg/m   Physical Exam Constitutional:      Comments: Son is present to help translate.  HENT:     Head: Normocephalic.     Nose: Nose normal.  Eyes:     Extraocular Movements: Extraocular movements intact.     Pupils: Pupils are equal, round, and reactive to light.  Neck:     Comments: Limited cervical spine range of motion due to stiffness and discomfort.  Moderate to marked right greater than left brachial plexus, trapezius and scapular tenderness.  Some discomfort with Spurling test. Pulmonary:     Effort: No respiratory distress.  Neurological:      Mental Status: He is alert.     Ortho Exam  Specialty Comments:  No specialty comments available.  Imaging: No results found.   PMFS History: Patient Active Problem List   Diagnosis Date Noted  . Degenerative cervical disc 01/16/2021  . Right shoulder pain 01/16/2021  . Degenerative arthritis of knee 05/28/2020  . 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 Leg pain, right 10/03/2012  . Leg swelling 10/03/2012  . Right leg DVT ([place]) 10/03/2012  . HLD (hyperlipidemia) 07/25/2012  . Acute thromboembolism of deep veins of lower extremity ([place]) 12/24/2010   Past Medical History:  Diagnosis Date  . DVT (deep venous thrombosis) (HCC)    right leg in 2011; prior injury to right foot  . HLD (hyperlipidemia)   . Right foot injury     Family History  Problem Relation Age of Onset  . Heart disease Mother   . Heart disease Father   . Vascular Disease Maternal Grandfather     History reviewed. No pertinent surgical history. Social History   Occupational History  . Not on file  Tobacco Use  . Smoking status: Former Smoker    Types: Cigarettes    Quit date: 07/25/1982    Years since quitting: 38.5  . Smokeless tobacco: Never Used  . Tobacco comment: REMOTE SOCIAL HISTORY  Substance and Sexual Activity  . Alcohol use: No  . Drug use: No  . Sexual activity: Not Currently

## 2021-01-20 NOTE — Progress Notes (Signed)
   Office Visit Note   Patient: Andrew [name]           Date of Birth: [birth year]           MRN: [id number] Visit Date: 01/20/2021              Requested by: Bernerd Limbo, MD [place],  [place] [address] PCP: Bernerd Limbo, MD   Assessment & Plan: Visit Diagnoses:  1. Radiculopathy, cervical region   2. Other spondylosis with radiculopathy, cervical region     Meds ordered this encounter  Medications  . traMADol (ULTRAM) 50 MG tablet    Sig: Take 1 tablet (50 mg total) by mouth every 8 (eight) hours as needed.    Dispense:  40 tablet    Refill:  0  . methylPREDNISolone (MEDROL) 4 MG tablet    Sig: 6-day taper to be taken as directed    Dispense:  21 tablet    Refill:  0      Procedures: IM injection   Date/Time: 01/20/2021 3:25 PM Performed by: Anise Salvo, RMA Authorized by: Lanae Crumbly, PA-C   Consent Given by:  Patient Total # of Trigger Points:  2 Location: hip   Needle Size:  25 G Comments: Patient was given an IM injection of betamethasone right side hip, followed by an IM injection in his left side of Toradol. Patient tolerated well.

## 2021-01-25 ENCOUNTER — Other Ambulatory Visit: Payer: Medicaid Other

## 2021-01-28 ENCOUNTER — Other Ambulatory Visit: Payer: Self-pay

## 2021-01-28 ENCOUNTER — Ambulatory Visit
Admission: RE | Admit: 2021-01-28 | Discharge: 2021-01-28 | Disposition: A | Discharge status: Home / Self Care | Payer: Medicaid Other | Source: Ambulatory Visit | Attending: Surgery | Admitting: Surgery

## 2021-01-28 DIAGNOSIS — M5412 Radiculopathy, cervical region: Secondary | ICD-10-CM

## 2021-01-28 DIAGNOSIS — M4722 Other spondylosis with radiculopathy, cervical region: Secondary | ICD-10-CM

## 2021-01-29 ENCOUNTER — Telehealth: Payer: Self-pay | Admitting: Specialist

## 2021-01-29 NOTE — Telephone Encounter (Signed)
Patient wife called asked if patient can be put on the wait list for an earlier appointment. The number to contact patient is [phone]

## 2021-01-29 NOTE — Telephone Encounter (Signed)
I added him to the cancellation list

## 2021-02-03 ENCOUNTER — Encounter: Payer: Self-pay | Admitting: Physical Therapy

## 2021-02-03 ENCOUNTER — Other Ambulatory Visit: Payer: Self-pay

## 2021-02-03 ENCOUNTER — Ambulatory Visit: Payer: Medicaid Other | Attending: Family Medicine | Admitting: Physical Therapy

## 2021-02-03 DIAGNOSIS — R252 Cramp and spasm: Secondary | ICD-10-CM | POA: Insufficient documentation

## 2021-02-03 DIAGNOSIS — M5412 Radiculopathy, cervical region: Secondary | ICD-10-CM | POA: Insufficient documentation

## 2021-02-03 DIAGNOSIS — M6281 Muscle weakness (generalized): Secondary | ICD-10-CM | POA: Insufficient documentation

## 2021-02-03 NOTE — Therapy (Signed)
[facility] [address], [place], [place], [address] Phone: [phone]   Fax:  [phone]  Physical Therapy Evaluation  Patient Details  Name: Andrew [name] MRN: [id number] Date of Birth: 03-07-1951 Referring Provider (PT): Lyndal Pulley, DO   Encounter Date: 02/03/2021   PT End of Session - 02/03/21 1324    Visit Number 1    Number of Visits 12    Date for PT Re-Evaluation 03/31/21    Authorization Type Medicaid [employer] - requesting 12 visits    Authorization Time Period 02/03/21 - 03/31/21    Authorization - Visit Number 1    Authorization - Number of Visits 12    Progress Note Due on Visit 10    PT Start Time 1100    PT Stop Time 1145    PT Time Calculation (min) 45 min    Activity Tolerance Patient limited by pain    Behavior During Therapy [facility] for tasks assessed/performed           Past Medical History:  Diagnosis Date  . DVT (deep venous thrombosis) (HCC)    right leg in 2011; prior injury to right foot  . HLD (hyperlipidemia)   . Right foot injury     History reviewed. No pertinent surgical history.  There were no vitals filed for this visit.    Subjective Assessment - 02/03/21 1321    Subjective Pt referred to OPPT with 76-month history of neck and Rt>Lt pain.  There is also weakness in Rt UE.  Pt gets headaches daily which sometimes become migraines.  IM injection into buttock with 1 day of relief.  Pain is constant with signif sleep disruption.    Patient is accompained by: Family member   Pt's sister-in-law interpreted   Pertinent History cervical MRI - will review with Dr. Louanne Skye in 2 weeks    Limitations Other (comment);Sitting    How long can you sit comfortably? constant pain    How long can you stand comfortably? constant pain    How long can you walk comfortably? constant pain    Diagnostic tests cervical xray 01/16/21, MRI 01/28/21: multi-level disc degen worst at C6/7 with mod Rt  foraminal stenosis and mild spinal stenosis    Patient Stated Goals sleep, reduce pain    Currently in Pain? Yes    Pain Score 8     Pain Location Neck    Pain Orientation Right;Left    Pain Descriptors / Indicators Aching;Penetrating;Radiating    Pain Type Acute pain    Pain Radiating Towards to bil elbows Rt>Lt    Pain Onset More than a month ago    Pain Frequency Constant    Aggravating Factors  sleep, sitting, ADLs    Pain Relieving Factors nothing, maybe hot shower and massage but sometimes hurts too much to massage    Effect of Pain on Daily Activities sleep, ADLs              [facility] PT Assessment - 02/03/21 0001      Assessment   Medical Diagnosis M54.12 (ICD-10-CM) - Cervical radiculopathy    Referring Provider (PT) Lyndal Pulley, DO    Onset Date/Surgical Date --   3 mos   Hand Dominance Right    Next MD Visit Dr Louanne Skye in 2 weeks for MRI review    Prior Therapy no      Precautions   Precautions None      Restrictions   Weight  Bearing Restrictions No      Balance Screen   Has the patient fallen in the past 6 months No      [place] residence    Living Arrangements Other relatives    Type of [place] Multi-level;Bed/bath upstairs    Home Equipment None      Prior Function   Level of Independence Independent    Vocation Retired      Associate Professor   Overall Cognitive Status Within Functional Limits for tasks assessed      Observation/Other Assessments   Focus on Therapeutic Outcomes (FOTO)  deferred      Posture/Postural Control   Posture/Postural Control Postural limitations    Postural Limitations Forward head;Rounded Shoulders;Increased thoracic kyphosis      ROM / Strength   AROM / PROM / Strength AROM;PROM;Strength      AROM   Overall AROM Comments pain with attempt to correct forward head posture    AROM Assessment Site Cervical;Shoulder    Right/Left Shoulder Right    Right Shoulder  Flexion 125 Degrees    Cervical Flexion 25 pain    Cervical Extension 15 pain    Cervical - Right Side Bend 15 pain    Cervical - Left Side Bend 20 pain    Cervical - Right Rotation 45    Cervical - Left Rotation 30 pain      PROM   PROM Assessment Site Shoulder    Right/Left Shoulder Right    Right Shoulder Flexion 130 Degrees   pain, stiffness     Strength   Overall Strength Comments 25lb Rt grip, 55lb Lt grip, cervical isometrics 3+/5 limited by pain, Rt shoulder and elbow grossly 4+/5    Strength Assessment Site Shoulder;Elbow    Right/Left Shoulder Right    Right Shoulder Flexion 3+/5    Right Shoulder Extension 3+/5    Right Shoulder ABduction 3/5    Right Shoulder Internal Rotation 3+/5    Right Shoulder External Rotation 3+/5    Right/Left Elbow Right    Right Elbow Flexion 3+/5    Right Elbow Extension 3+/5      Palpation   Spinal mobility limited and painful thoracic PAs and rib springing Rt, cervical not formally assessed secondary to pain    Palpation comment signif tenderness to light/moderate palpation: Rt>Lt upper traps, cervical paraspinals, SOs, posterior shoulder soft tissues, Rt bicep with hypertonicity noted      Special Tests    Special Tests Cervical    Other special tests negative clonus at wrist and ankle bil, negative Hoffman's test bil    Cervical Tests Dictraction      Distraction Test   Findngs Positive    Comment pain                      Objective measurements completed on examination: See above findings.               PT Education - 02/03/21 1143    Education Details Access Code: [email or web address]    Person(s) Educated Patient;Caregiver(s)    Methods Explanation;Demonstration;Handout    Comprehension Verbalized understanding;Returned demonstration            PT Short Term Goals - 02/03/21 1337      PT SHORT TERM GOAL #1   Title Pt ind with initial HEP    Time 2    Period Weeks    Status New  Target Date  02/17/21      PT SHORT TERM GOAL #2   Title Pt will be able to demo improved ability to correct forward head posture with min pain    Time 4    Period Weeks    Status New    Target Date 03/03/21      PT SHORT TERM GOAL #3   Title Pt will demo improved cervical ROM to at least 35 flexion, 25 ext, 50 bil Rot with min pain.    Time 4    Period Weeks    Status New    Target Date 03/03/21             PT Long Term Goals - 02/03/21 1339      PT LONG TERM GOAL #1   Title Pt will achieve at least 4/5 strength in Rt UE at shoulder and elbow and at least 40lb grip strength with min pain on testing for improved funcitonal use with ADLs.    Time 8    Period Weeks    Status New    Target Date 03/31/21      PT LONG TERM GOAL #2   Title Pt will report at least 50% improved sleep disruption secondary to improved pain with bed positioning and bed mobility.    Time 8    Period Weeks    Status New    Target Date 03/31/21      PT LONG TERM GOAL #3   Title Pt will report reduced pain by at least 50% with ADLs throughout the day.    Time 8    Period Weeks    Status New    Target Date 03/31/21      PT LONG TERM GOAL #4   Title Pt will be able to perform overhead 3lb transfer x 5 reps with Rt UE for improved functional use of Rt UE.    Time 8    Period Weeks    Status New    Target Date 03/31/21                  Plan - 02/03/21 1328    Clinical Impression Statement Pt is a very pleasant 70yo male with history of 3 months of severe, constant neck pain, headaches, bil UE pain to elbows, and Rt arm weakness.  He had an injury approx 1 year ago which may have contributed but otherwise no known cause of pain.  He is from [place] and lives with his brother and his wife, who was present to interpret today.  Pt rates pain as 8/10 and describes pain as constant.  Sleep is significantly disturbed.  Pt presents with forward head and rounded shoulders with pain on attempt to correct forward  head.  Pt has signif restrictions in cervical ROM with pain in all planes.  Pt has pain with manual traction.  He has limited Rt shoulder A/ROM and P/ROM for flexion and functional IR with pain.  There is signif asymmetry in strength Rt compared to Lt with 3+/5 strength at Rt elbow and shoulder, and grip strength on Rt is 25lb compared to 55lb on Lt.  Pain inhibition may be playing a role; however, imaging is positive for multi-segment stenosis and degeneration.  Pt has signif pain with palpation along bil neck, periscapular and shoulder on Rt, Pt meets with Dr. Louanne Skye in 2 weeks to discuss MRI.  Pt will benefit from skilled PT for pain control, modalities, gentle mobility, body mechanics  and positioning, and strength and stabilization as tol.    Personal Factors and Comorbidities Age    Examination-Activity Limitations Transfers;Sit;Sleep;Lift;Carry;Bed Mobility;Reach Overhead;Bathing;Hygiene/Grooming    Examination-Participation Restrictions Meal Prep;Cleaning;Shop    Stability/Clinical Decision Making Evolving/Moderate complexity    Clinical Decision Making Moderate    Rehab Potential Fair    PT Frequency 2x / week    PT Duration 8 weeks    PT Treatment/Interventions ADLs/Self Care Home Management;Electrical Stimulation;Cryotherapy;Moist Heat;Joint Manipulations;Spinal Manipulations;Taping;Passive range of motion;Dry needling;Manual techniques;Patient/family education;Functional mobility training;Therapeutic activities;Therapeutic exercise;Neuromuscular re-education;Iontophoresis 4mg /ml Dexamethasone;Traction    PT Next Visit Plan does Pt want to try DN? gentle ROM, modalities for pain    PT Home Exercise Plan Access Code: [email or web address], DN info    Consulted and Agree with Plan of Care Patient           Patient will benefit from skilled therapeutic intervention in order to improve the following deficits and impairments:  Decreased range of motion,Impaired tone,Increased muscle spasms,Pain,Impaired UE  functional use,Decreased activity tolerance,Decreased mobility,Decreased strength,Postural dysfunction,Hypomobility  Visit Diagnosis: Radiculopathy, cervical region - Plan: PT plan of care cert/re-cert  Muscle weakness (generalized) - Plan: PT plan of care cert/re-cert  Cramp and spasm - Plan: PT plan of care cert/re-cert     Problem List Patient Active Problem List   Diagnosis Date Noted  . Degenerative cervical disc 01/16/2021  . Right shoulder pain 01/16/2021  . Degenerative arthritis of knee 05/28/2020  . 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 Leg pain, right 10/03/2012  . Leg swelling 10/03/2012  . Right leg DVT ([place]) 10/03/2012  . HLD (hyperlipidemia) 07/25/2012  . Acute thromboembolism of deep veins of lower extremity ([place]) 12/24/2010    Andrew [name] E Elek Holderness 02/03/2021, 1:45 PM  [facility] [address], [place], [place], [address] Phone: [phone]   Fax:  [phone]  Name: Andrew [name] MRN: [id number] Date of Birth: 23-May-1951

## 2021-02-03 NOTE — Patient Instructions (Addendum)
Access Code: [email or web address] URL: [email or web address] Date: 02/03/2021 Prepared by: Venetia Night Greogry Goodwyn  Exercises Seated Scapular Retraction - 3 x daily - 7 x weekly - 1 sets - 10 reps Shoulder Flexion Wall Slide with Towel - 3 x daily - 7 x weekly - 1 sets - 10 reps   Trigger Point Dry Needling  . What is Trigger Point Dry Needling (DN)? o DN is a physical therapy technique used to treat muscle pain and dysfunction. Specifically, DN helps deactivate muscle trigger points (muscle knots).  o A thin filiform needle is used to penetrate the skin and stimulate the underlying trigger point. The goal is for a local twitch response (LTR) to occur and for the trigger point to relax. No medication of any kind is injected during the procedure.   . What Does Trigger Point Dry Needling Feel Like?  o The procedure feels different for each individual patient. Some patients report that they do not actually feel the needle enter the skin and overall the process is not painful. Very mild bleeding may occur. However, many patients feel a deep cramping in the muscle in which the needle was inserted. This is the local twitch response.   Marland Kitchen How Will I feel after the treatment? o Soreness is normal, and the onset of soreness may not occur for a few hours. Typically this soreness does not last longer than two days.  o Bruising is uncommon, however; ice can be used to decrease any possible bruising.  o In rare cases feeling tired or nauseous after the treatment is normal. In addition, your symptoms may get worse before they get better, this period will typically not last longer than 24 hours.   . What Can I do After My Treatment? o Increase your hydration by drinking more water for the next 24 hours. o You may place ice or heat on the areas treated that have become sore, however, do not use heat on inflamed or bruised areas. Heat often brings more relief post needling. o You can continue your regular  activities, but vigorous activity is not recommended initially after the treatment for 24 hours. o DN is best combined with other physical therapy such as strengthening, stretching, and other therapies.    [facility] [address] [place], [place] [address] Phone # [phone] Fax [phone]

## 2021-02-05 ENCOUNTER — Encounter: Payer: Self-pay | Admitting: Specialist

## 2021-02-05 ENCOUNTER — Ambulatory Visit: Payer: Medicaid Other | Admitting: Surgery

## 2021-02-05 ENCOUNTER — Other Ambulatory Visit: Payer: Self-pay

## 2021-02-05 ENCOUNTER — Ambulatory Visit (INDEPENDENT_AMBULATORY_CARE_PROVIDER_SITE_OTHER): Payer: Medicare Other | Admitting: Specialist

## 2021-02-05 VITALS — BP 122/78 | HR 59 | Ht 70.0 in | Wt 195.0 lb

## 2021-02-05 DIAGNOSIS — M47816 Spondylosis without myelopathy or radiculopathy, lumbar region: Secondary | ICD-10-CM | POA: Diagnosis not present

## 2021-02-05 DIAGNOSIS — M5412 Radiculopathy, cervical region: Secondary | ICD-10-CM

## 2021-02-05 MED ORDER — GABAPENTIN 300 MG PO CAPS: 300.0000 mg | ORAL_CAPSULE | Freq: Every day | ORAL | 2 refills | Status: DC

## 2021-02-05 MED ORDER — DICLOFENAC POTASSIUM 50 MG PO TABS: 50.0000 mg | ORAL_TABLET | Freq: Two times a day (BID) | ORAL | 2 refills | Status: DC

## 2021-02-05 NOTE — Progress Notes (Signed)
Office Visit Note   Patient: Andrew [name]           Date of Birth: 1951-01-23           MRN: [id number] Visit Date: 02/05/2021              Requested by: Bernerd Limbo, MD [place],  [place] [address] PCP: Bernerd Limbo, MD   Assessment & Plan: Visit Diagnoses:  1. Spondylosis without myelopathy or radiculopathy, lumbar region   2. Radiculopathy, cervical region     Plan:Avoid overhead lifting and overhead use of the arms. Do not lift greater than 5 lbs. Adjust head rest in vehicle to prevent hyperextension if rear ended. Take extra precautions to avoid falling. Physical therapy for TENs unit eval, cervical traction and McKenzie exercises.  Gabapentin 300 mg po qhs. Continue with arthritis medication.    Follow-Up Instructions: Return in about 3 weeks (around 02/26/2021).   Orders:  Orders Placed This Encounter  Procedures  . Ambulatory referral to Physical Therapy   Meds ordered this encounter  Medications  . gabapentin (NEURONTIN) 300 MG capsule    Sig: Take 1 capsule (300 mg total) by mouth at bedtime.    Dispense:  30 capsule    Refill:  2  . diclofenac (CATAFLAM) 50 MG tablet    Sig: Take 1 tablet (50 mg total) by mouth 2 (two) times daily.    Dispense:  60 tablet    Refill:  2      Procedures: No procedures performed   Clinical Data: No additional findings.   Subjective: Chief Complaint  Patient presents with  . Neck - Follow-up    MRI Review    70 year old male with 3 month history of neck pain and discomfort. Pain is present with extension of the neck and with Turning to the right side with tightness in the right shoulder and neck. No bowel or bladder difficulty. Has some numbness And tingling into the right hand into the right thumb and index and long fingers. The numbness and tingling is better when he is warm. Some weakness into the right arm and biceps    Review of Systems  Constitutional: Negative.    HENT: Negative.   Eyes: Positive for visual disturbance (double vision with head aches. ).  Respiratory: Negative.  Negative for apnea, cough, choking, chest tightness, shortness of breath, wheezing and stridor.   Cardiovascular: Positive for leg swelling. Negative for chest pain and palpitations.  Gastrointestinal: Negative.  Negative for abdominal distention, abdominal pain, anal bleeding, blood in stool, constipation and nausea.  Endocrine: Negative.   Genitourinary: Negative.   Musculoskeletal: Positive for back pain, neck pain and neck stiffness.  Skin: Negative.   Allergic/Immunologic: Negative.   Neurological: Positive for weakness and numbness.  Hematological: Negative.   Psychiatric/Behavioral: Positive for decreased concentration.     Objective: Vital Signs: BP 122/78 (BP Location: Left Arm, Patient Position: Sitting)   Pulse (!) 59   Ht 5\' 10"  (1.778 m)   Wt 195 lb (88.5 kg)   BMI 27.98 kg/m   Physical Exam  Ortho Exam  Specialty Comments:  No specialty comments available.  Imaging: No results found.   PMFS History: Patient Active Problem List   Diagnosis Date Noted  . Degenerative cervical disc 01/16/2021  . Right shoulder pain 01/16/2021  . Degenerative arthritis of knee 05/28/2020  . 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 Leg pain, right 10/03/2012  .  Leg swelling 10/03/2012  . Right leg DVT ([place]) 10/03/2012  . HLD (hyperlipidemia) 07/25/2012  . Acute thromboembolism of deep veins of lower extremity ([place]) 12/24/2010   Past Medical History:  Diagnosis Date  . DVT (deep venous thrombosis) (HCC)    right leg in 2011; prior injury to right foot  . HLD (hyperlipidemia)   . Right foot injury     Family History  Problem Relation Age of Onset  . Heart disease Mother   . Heart disease Father   . Vascular Disease Maternal Grandfather     No past surgical history on file. Social History   Occupational History   . Not on file  Tobacco Use  . Smoking status: Former Smoker    Types: Cigarettes    Quit date: 07/25/1982    Years since quitting: 38.5  . Smokeless tobacco: Never Used  . Tobacco comment: REMOTE SOCIAL HISTORY  Substance and Sexual Activity  . Alcohol use: No  . Drug use: No  . Sexual activity: Not Currently

## 2021-02-05 NOTE — Patient Instructions (Signed)
Avoid overhead lifting and overhead use of the arms. Do not lift greater than 5 lbs. Adjust head rest in vehicle to prevent hyperextension if rear ended. Take extra precautions to avoid falling. Physical therapy for TENs unit eval, cervical traction and McKenzie exercises.  Gabapentin 300 mg po qhs. Continue with arthritis medication.

## 2021-02-10 ENCOUNTER — Ambulatory Visit: Payer: Medicare Other | Attending: Family Medicine | Admitting: Physical Therapy

## 2021-02-10 ENCOUNTER — Other Ambulatory Visit: Payer: Self-pay

## 2021-02-10 ENCOUNTER — Encounter: Payer: Self-pay | Admitting: Physical Therapy

## 2021-02-10 DIAGNOSIS — M542 Cervicalgia: Secondary | ICD-10-CM | POA: Diagnosis not present

## 2021-02-10 DIAGNOSIS — M5412 Radiculopathy, cervical region: Secondary | ICD-10-CM | POA: Insufficient documentation

## 2021-02-10 DIAGNOSIS — R252 Cramp and spasm: Secondary | ICD-10-CM

## 2021-02-10 DIAGNOSIS — M6281 Muscle weakness (generalized): Secondary | ICD-10-CM | POA: Diagnosis present

## 2021-02-10 NOTE — Therapy (Signed)
[facility] [address], [place], [place], [address] Phone: [phone]   Fax:  [phone]  Physical Therapy Treatment  Patient Details  Name: Andrew [name] MRN: [id number] Date of Birth: November 16, 1951 Referring Provider (PT): Andrew Pulley, DO   Encounter Date: 02/10/2021   PT End of Session - 02/10/21 1316    Visit Number 2    Number of Visits 12    Date for PT Re-Evaluation 03/31/21    Authorization Type Medicaid [employer] - 12 visits approved    Authorization Time Period 02/03/21 - 04/04/21    Authorization - Visit Number 2    Authorization - Number of Visits 12    Progress Note Due on Visit 10    PT Start Time 1232    PT Stop Time 1315    PT Time Calculation (min) 43 min    Activity Tolerance Patient tolerated treatment well    Behavior During Therapy [name] for tasks assessed/performed           Past Medical History:  Diagnosis Date  . DVT (deep venous thrombosis) (HCC)    right leg in 2011; prior injury to right foot  . HLD (hyperlipidemia)   . Right foot injury     History reviewed. No pertinent surgical history.  There were no vitals filed for this visit.   Subjective Assessment - 02/10/21 1231    Subjective Pt saw Dr. Louanne [name] and will see him again in 3 weeks.  He is unable to give an injection due to the risks of stroke.  Ongoing pain and weakness in neck and bil UEs.  Great difficulty with sleeping.  I can sleep about 6 hours.    Pertinent History per Dr Andrew [name]: don't lift > 5lb, no overhead use of UEs, try TENS, cervical traction, McKenzie neck exercises    How long can you sit comfortably? constant pain    How long can you stand comfortably? constant pain    How long can you walk comfortably? constant pain    Diagnostic tests cervical xray 01/16/21, MRI 01/28/21: multi-level disc degen worst at C6/7 with mod Rt foraminal stenosis and mild spinal stenosis    Patient Stated Goals sleep, reduce pain     Currently in Pain? Yes    Pain Score 8     Pain Location Neck    Pain Orientation Right;Left    Pain Type Acute pain                             [facility] Adult PT Treatment/Exercise - 02/10/21 0001      Exercises   Exercises Neck      Neck Exercises: Standing   Neck Retraction 10 reps    Neck Retraction Limitations initial McKenzie cervical exercise    Other Standing Exercises McKenzie neck retraction with extension x 10 reps      Modalities   Modalities Moist Heat;Electrical Stimulation;Traction      Moist Heat Therapy   Number Minutes Moist Heat 5 Minutes    Moist Heat Location Cervical      Electrical Stimulation   Electrical Stimulation Location cervical bil    Electrical Stimulation Action TENS    Electrical Stimulation Goals Pain      Traction   Type of Traction Cervical    Min (lbs) 5    Max (lbs) 12    Hold Time 1    Rest Time 1  Time 10 min                  PT Education - 02/10/21 1249    Education Details TENS unit info, McKenzie exercises as tol    Person(s) Educated Patient;Caregiver(s)    Methods Explanation;Demonstration;Handout    Comprehension Verbalized understanding;Returned demonstration            PT Short Term Goals - 02/10/21 1322      PT SHORT TERM GOAL #1   Title Pt ind with initial HEP    Status On-going      PT SHORT TERM GOAL #2   Title Pt will be able to demo improved ability to correct forward head posture with min pain    Status On-going             PT Long Term Goals - 02/03/21 1339      PT LONG TERM GOAL #1   Title Pt will achieve at least 4/5 strength in Rt UE at shoulder and elbow and at least 40lb grip strength with min pain on testing for improved funcitonal use with ADLs.    Time 8    Period Weeks    Status New    Target Date 03/31/21      PT LONG TERM GOAL #2   Title Pt will report at least 50% improved sleep disruption secondary to improved pain with bed positioning and bed  mobility.    Time 8    Period Weeks    Status New    Target Date 03/31/21      PT LONG TERM GOAL #3   Title Pt will report reduced pain by at least 50% with ADLs throughout the day.    Time 8    Period Weeks    Status New    Target Date 03/31/21      PT LONG TERM GOAL #4   Title Pt will be able to perform overhead 3lb transfer x 5 reps with Rt UE for improved functional use of Rt UE.    Time 8    Period Weeks    Status New    Target Date 03/31/21                 Plan - 02/10/21 1317    Clinical Impression Statement Pt arrives with report of ability to sleep about 6 hours and pain level on arrival of 8/10.  He continues to have acute neck pain limited all A/ROM and radiation of pain into Rt UE.  He has signif tenderness along Rt shoulder girdle.  Pt saw Dr. Louanne [name] who limited overhead use of UEs and lifting no >5lb.  Dr. Louanne [name] requested trial of TENS unit, cervical traction 10-15lb and initiation of McKenzie exercises, all of which were performed today.  Pt had bad experience in the past with e-stim so PT used caution and low level with supervision throughout stim to ensure comfort.  Heat used with stim as this has been helpful at home.  Pt reported possible reduction of pain with traction to 7/10 end of session and was able to tolerate McKenzie retraction and retraction with ext x 10 reps each end of session which he wasn't previously able to do without pain.  PT updated HEP and gave TENS unit info for purchase.  Pt will likely benefit from revisiting of traction, review of exercise, and trial of DN next visit if Pt consents.    PT Frequency 2x / week  PT Duration 8 weeks    PT Treatment/Interventions ADLs/Self Care Home Management;Electrical Stimulation;Cryotherapy;Moist Heat;Joint Manipulations;Spinal Manipulations;Taping;Passive range of motion;Dry needling;Manual techniques;Patient/family education;Functional mobility training;Therapeutic activities;Therapeutic  exercise;Neuromuscular re-education;Iontophoresis 4mg /ml Dexamethasone;Traction    PT Next Visit Plan does Pt want to try DN? review McKenzie exercises, modalities cervical traction in slight flexion increase to 15lb if tol, DN upper traps and SO if Pt ok, heat/TENS    PT Home Exercise Plan Access Code: [email or web address], DN info    Consulted and Agree with Plan of Care Patient           Patient will benefit from skilled therapeutic intervention in order to improve the following deficits and impairments:     Visit Diagnosis: Radiculopathy, cervical region  Muscle weakness (generalized)  Cramp and spasm     Problem List Patient Active Problem List   Diagnosis Date Noted  . Degenerative cervical disc 01/16/2021  . Right shoulder pain 01/16/2021  . Degenerative arthritis of knee 05/28/2020  . 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 Leg pain, right 10/03/2012  . Leg swelling 10/03/2012  . Right leg DVT ([place]) 10/03/2012  . HLD (hyperlipidemia) 07/25/2012  . Acute thromboembolism of deep veins of lower extremity ([place]) 12/24/2010    Amna Welker E Jazzmen Restivo 02/10/2021, 1:25 PM  [facility] [address] [place], [place], [address] Phone: [phone]   Fax:  [phone]  Name: Andrew [name] MRN: [id number] Date of Birth: 04/07/1951

## 2021-02-10 NOTE — Patient Instructions (Signed)
Access Code: [email or web address] URL: [email or web address] Date: 02/10/2021 Prepared by: Venetia Night Skie Vitrano  Exercises Seated Scapular Retraction - 3 x daily - 7 x weekly - 1 sets - 10 reps Standing Cervical Retraction - 3 x daily - 7 x weekly - 1 sets - 10 reps - 1 sec hold Seated Cervical Retraction and Extension - 3 x daily - 7 x weekly - 1 sets - 10 reps - 1 sec hold  Patient Education TENS Unit

## 2021-02-12 ENCOUNTER — Ambulatory Visit: Payer: Medicaid Other | Admitting: Specialist

## 2021-02-13 ENCOUNTER — Other Ambulatory Visit: Payer: Self-pay

## 2021-02-13 ENCOUNTER — Ambulatory Visit: Payer: Medicare Other | Admitting: Physical Therapy

## 2021-02-13 ENCOUNTER — Encounter: Payer: Self-pay | Admitting: Physical Therapy

## 2021-02-13 DIAGNOSIS — M6281 Muscle weakness (generalized): Secondary | ICD-10-CM

## 2021-02-13 DIAGNOSIS — M5412 Radiculopathy, cervical region: Secondary | ICD-10-CM | POA: Diagnosis not present

## 2021-02-13 DIAGNOSIS — R252 Cramp and spasm: Secondary | ICD-10-CM

## 2021-02-13 NOTE — Therapy (Signed)
[facility] [address], [place], [place], [address] Phone: [phone]   Fax:  [phone]  Physical Therapy Treatment  Patient Details  Name: Andrew [name] MRN: [id number] Date of Birth: 1951/07/21 Referring Provider (PT): Lyndal Pulley, DO   Encounter Date: 02/13/2021   PT End of Session - 02/13/21 1152    Visit Number 3    Number of Visits 12    Date for PT Re-Evaluation 03/31/21    Authorization Type Medicaid [employer] - 12 visits approved    Authorization Time Period 02/03/21 - 04/04/21    Authorization - Visit Number 3    Authorization - Number of Visits 12    Progress Note Due on Visit 10    PT Start Time 1100    PT Stop Time 1156    PT Time Calculation (min) 56 min    Activity Tolerance Patient tolerated treatment well;Patient limited by pain    Behavior During Therapy [facility] for tasks assessed/performed           Past Medical History:  Diagnosis Date  . DVT (deep venous thrombosis) (HCC)    right leg in 2011; prior injury to right foot  . HLD (hyperlipidemia)   . Right foot injury     History reviewed. No pertinent surgical history.  There were no vitals filed for this visit.   Subjective Assessment - 02/13/21 1102    Subjective Still 7-8/10 pain.  Sleep continues to be very limited by pain.    Patient is accompained by: Family member   Pt's brother   Pertinent History per Dr Louanne Skye: don't lift > 5lb, no overhead use of UEs, try TENS, cervical traction, McKenzie neck exercises    How long can you sit comfortably? constant pain    How long can you stand comfortably? constant pain    Currently in Pain? Yes    Pain Score 8     Pain Location Neck    Pain Orientation Right    Pain Descriptors / Indicators Aching;Pins and needles;Radiating    Pain Type Acute pain    Pain Onset More than a month ago    Pain Frequency Constant    Aggravating Factors  sleep, sitting, ADLs    Pain Relieving Factors  nothing, maybe hot shower, massage, maybe PT    Effect of Pain on Daily Activities sleep, ADLs                             [facility] Adult PT Treatment/Exercise - 02/13/21 0001      Neck Exercises: Standing   Neck Retraction 10 reps    Neck Retraction Limitations initial McKenzie cervical exercise    Other Standing Exercises McKenzie neck retraction with extension x 10 reps      Neck Exercises: Supine   Neck Retraction 5 reps    Neck Retraction Limitations add gentle small range rotation in retraction, painfree range    Other Supine Exercise scapular retraction into mat table 3x5" holds      Moist Heat Therapy   Number Minutes Moist Heat 5 Minutes    Moist Heat Location Cervical      Manual Therapy   Manual Therapy Joint mobilization;Manual Traction;Passive ROM;Soft tissue mobilization    Manual therapy comments signif tenderness to palpation    Joint Mobilization upper t-spine PA mobs Gr I/II/III T2-T5    Soft tissue mobilization gentle Rt upper quadrant after DN  Passive ROM supine gentle small range rotation and Lt Rot with flexion    Manual Traction Gr I-II in slight flexion and flexion w/ slight Lt Rot            Trigger Point Dry Needling - 02/13/21 0001    Consent Given? Yes    Education Handout Provided Previously provided    Muscles Treated Head and Neck Upper trapezius;Suboccipitals;Cervical multifidi    Dry Needling Comments Rt, upper trap ant/post approach, C5-T1 multif on Rt    Upper Trapezius Response Twitch reponse elicited;Palpable increased muscle length    Suboccipitals Response Twitch response elicited;Palpable increased muscle length    Cervical multifidi Response Twitch reponse elicited;Palpable increased muscle length                  PT Short Term Goals - 02/10/21 1322      PT SHORT TERM GOAL #1   Title Pt ind with initial HEP    Status On-going      PT SHORT TERM GOAL #2   Title Pt will be able to demo improved ability  to correct forward head posture with min pain    Status On-going             PT Long Term Goals - 02/03/21 1339      PT LONG TERM GOAL #1   Title Pt will achieve at least 4/5 strength in Rt UE at shoulder and elbow and at least 40lb grip strength with min pain on testing for improved funcitonal use with ADLs.    Time 8    Period Weeks    Status New    Target Date 03/31/21      PT LONG TERM GOAL #2   Title Pt will report at least 50% improved sleep disruption secondary to improved pain with bed positioning and bed mobility.    Time 8    Period Weeks    Status New    Target Date 03/31/21      PT LONG TERM GOAL #3   Title Pt will report reduced pain by at least 50% with ADLs throughout the day.    Time 8    Period Weeks    Status New    Target Date 03/31/21      PT LONG TERM GOAL #4   Title Pt will be able to perform overhead 3lb transfer x 5 reps with Rt UE for improved functional use of Rt UE.    Time 8    Period Weeks    Status New    Target Date 03/31/21                 Plan - 02/13/21 1157    Clinical Impression Statement Pt continues to have high levels of pain and limited sleep due to pain.  Pain is localizing to Rt side and Pt has signif tenderness to palpation throughout Rt upper quadrant with headaches daily.  He does report he thinks he gets some relief with treatment interventions trialed to date.  He attends PT with his brother who interpreted session today.  Pt was agreeable to trying DN so PT performed DN to cervical multif, upper traps and SO on Rt with signif twitch and release.  Manual traction used in slight flexion and flexion with Lt Rot along with other gentle manual techniques to elongate Rt sided soft tissues and improve thoracic posture.  PT re-iterated DN aftercare and used heat end of session for soreness.  Continue  modalities for pain, McKenzie exercises, gentle mobility and manual techniques.    Rehab Potential Fair    PT Frequency 2x / week     PT Duration 8 weeks    PT Treatment/Interventions ADLs/Self Care Home Management;Electrical Stimulation;Cryotherapy;Moist Heat;Joint Manipulations;Spinal Manipulations;Taping;Passive range of motion;Dry needling;Manual techniques;Patient/family education;Functional mobility training;Therapeutic activities;Therapeutic exercise;Neuromuscular re-education;Iontophoresis 4mg /ml Dexamethasone;Traction    PT Next Visit Plan f/u on Rt sided DN, cervical mechanical traction in slight flexion 12-15lb (has done 12lb), heat/TENS (use care with TENS - has had a bad Estim experience in past), review neck retraction and retraction with ext in standing 10x each, postural strength as tol (yellow tband supine?)    PT Home Exercise Plan Access Code: [email or web address], DN info    Consulted and Agree with Plan of Care Patient           Patient will benefit from skilled therapeutic intervention in order to improve the following deficits and impairments:     Visit Diagnosis: Radiculopathy, cervical region  Muscle weakness (generalized)  Cramp and spasm     Problem List Patient Active Problem List   Diagnosis Date Noted  . Degenerative cervical disc 01/16/2021  . Right shoulder pain 01/16/2021  . Degenerative arthritis of knee 05/28/2020  . 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 Leg pain, right 10/03/2012  . Leg swelling 10/03/2012  . Right leg DVT ([place]) 10/03/2012  . HLD (hyperlipidemia) 07/25/2012  . Acute thromboembolism of deep veins of lower extremity ([place]) 12/24/2010    Caidyn Henricksen E Krisandra Bueno 02/13/2021, 12:08 PM  [facility] [address], [place], [place], [address] Phone: [phone]   Fax:  [phone]  Name: Andrew [name] MRN: [id number] Date of Birth: Nov 07, 1951

## 2021-02-16 ENCOUNTER — Encounter: Payer: Self-pay | Admitting: Physical Therapy

## 2021-02-16 ENCOUNTER — Ambulatory Visit: Payer: Medicare Other | Admitting: Physical Therapy

## 2021-02-16 ENCOUNTER — Other Ambulatory Visit: Payer: Self-pay

## 2021-02-16 DIAGNOSIS — M5412 Radiculopathy, cervical region: Secondary | ICD-10-CM

## 2021-02-16 DIAGNOSIS — M6281 Muscle weakness (generalized): Secondary | ICD-10-CM

## 2021-02-16 DIAGNOSIS — R252 Cramp and spasm: Secondary | ICD-10-CM

## 2021-02-16 NOTE — Therapy (Signed)
[facility] [address], [place], [place], [address] Phone: [phone]   Fax:  [phone]  Physical Therapy Treatment  Patient Details  Name: Andrew [name] MRN: [id number] Date of Birth: [birth year] Referring Provider (PT): Lyndal Pulley, DO   Encounter Date: 02/16/2021   PT End of Session - 02/16/21 1402    Visit Number 4    Number of Visits 12    Date for PT Re-Evaluation 03/31/21    Authorization Type Medicaid [employer] - 12 visits approved    Authorization Time Period 02/03/21 - 04/04/21    Authorization - Visit Number 4    Authorization - Number of Visits 12    PT Start Time 5093    PT Stop Time 1441    PT Time Calculation (min) 39 min    Activity Tolerance Patient tolerated treatment well;Patient limited by pain    Behavior During Therapy [facility] for tasks assessed/performed           Past Medical History:  Diagnosis Date  . DVT (deep venous thrombosis) (HCC)    right leg in 2011; prior injury to right foot  . HLD (hyperlipidemia)   . Right foot injury     History reviewed. No pertinent surgical history.  There were no vitals filed for this visit.   Subjective Assessment - 02/16/21 1409    Subjective I hurt more after last session for many days. I am not doing good this day. My RT side hurts.    Patient is accompained by: Family member    Pertinent History per Dr Louanne Skye: don't lift > 5lb, no overhead use of UEs, try TENS, cervical traction, McKenzie neck exercises    Currently in Pain? Yes    Pain Score 8     Pain Location Neck    Pain Orientation Right    Pain Descriptors / Indicators Sore;Pins and needles;Pats[facility] Adult PT Treatment/Exercise - 02/16/21 0001      Traction   Type of Traction Cervical    Min (lbs) 5    Max (lbs) 12    Hold Time 60    Rest Time 20    Time 10 min      Manual Therapy   Soft tissue mobilization Rt upper trap,  occiput, SCM    Manual Traction Gentle pull 1 min hold 5x                    PT Short Term Goals - 02/10/21 1322      PT SHORT TERM GOAL #1   Title Pt ind with initial HEP    Status On-going      PT SHORT TERM GOAL #2   Title Pt will be able to demo improved ability to correct forward head posture with min pain    Status On-going             PT Long Term Goals - 02/03/21 1339      PT LONG TERM GOAL #1   Title Pt will achieve at least 4/5 strength in Rt UE at shoulder and elbow and at least 40lb grip strength with min pain on testing for improved funcitonal use with ADLs.    Time 8    Period Weeks    Status New    Target Date 03/31/21  PT LONG TERM GOAL #2   Title Pt will report at least 50% improved sleep disruption secondary to improved pain with bed positioning and bed mobility.    Time 8    Period Weeks    Status New    Target Date 03/31/21      PT LONG TERM GOAL #3   Title Pt will report reduced pain by at least 50% with ADLs throughout the day.    Time 8    Period Weeks    Status New    Target Date 03/31/21      PT LONG TERM GOAL #4   Title Pt will be able to perform overhead 3lb transfer x 5 reps with Rt UE for improved functional use of Rt UE.    Time 8    Period Weeks    Status New    Target Date 03/31/21                 Plan - 02/16/21 1403    Clinical Impression Statement Pt arrives with reports of 'a lot of pain." He reports increased pain since the dry needling and over the weekend. cervical muscles felt remarkable, maybe some Rt upper trap thickness. pt did tolerate manual cervical traction with no increased radicular symtpoms so we went ahead with mechanical traction. pt requested to keep the same parameters today secondary to his pain. Pt stated he felt some better after the traction.    Personal Factors and Comorbidities Age    Examination-Activity Limitations Transfers;Sit;Sleep;Lift;Carry;Bed Mobility;Reach  Overhead;Bathing;Hygiene/Grooming    Stability/Clinical Decision Making Evolving/Moderate complexity    Rehab Potential Fair    PT Frequency 2x / week    PT Duration 8 weeks    PT Treatment/Interventions ADLs/Self Care Home Management;Electrical Stimulation;Cryotherapy;Moist Heat;Joint Manipulations;Spinal Manipulations;Taping;Passive range of motion;Dry needling;Manual techniques;Patient/family education;Functional mobility training;Therapeutic activities;Therapeutic exercise;Neuromuscular re-education;Iontophoresis 4mg /ml Dexamethasone;Traction    PT Next Visit Plan Assess pain: any reduction, resume some mckenzie exercises.    PT Home Exercise Plan Access Code: [email or web address], DN info    Consulted and Agree with Plan of Care Patient           Patient will benefit from skilled therapeutic intervention in order to improve the following deficits and impairments:  Decreased range of motion,Impaired tone,Increased muscle spasms,Pain,Impaired UE functional use,Decreased activity tolerance,Decreased mobility,Decreased strength,Postural dysfunction,Hypomobility  Visit Diagnosis: Radiculopathy, cervical region  Muscle weakness (generalized)  Cramp and spasm     Problem List Patient Active Problem List   Diagnosis Date Noted  . Degenerative cervical disc 01/16/2021  . Right shoulder pain 01/16/2021  . Degenerative arthritis of knee 05/28/2020  . 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 Leg pain, right 10/03/2012  . Leg swelling 10/03/2012  . Right leg DVT ([place]) 10/03/2012  . HLD (hyperlipidemia) 07/25/2012  . Acute thromboembolism of deep veins of lower extremity (HCC) 12/24/2010    Jaimi Belle, PTA 02/16/2021, 2:44 PM  [facility] [address], [place], [place], [address] Phone: [phone]   Fax:  [phone]  Name: Andrew [name] MRN: [id number] Date of Birth: 08-17-1951

## 2021-02-18 ENCOUNTER — Ambulatory Visit: Payer: Medicare Other | Admitting: Physical Therapy

## 2021-02-18 ENCOUNTER — Other Ambulatory Visit: Payer: Self-pay

## 2021-02-18 ENCOUNTER — Encounter: Payer: Self-pay | Admitting: Physical Therapy

## 2021-02-18 DIAGNOSIS — R252 Cramp and spasm: Secondary | ICD-10-CM

## 2021-02-18 DIAGNOSIS — M6281 Muscle weakness (generalized): Secondary | ICD-10-CM

## 2021-02-18 DIAGNOSIS — M5412 Radiculopathy, cervical region: Secondary | ICD-10-CM

## 2021-02-18 NOTE — Therapy (Addendum)
[facility] [address], [place], [place], [address] Phone: [phone]   Fax:  [phone]  Physical Therapy Treatment  Patient Details  Name: Andrew [name] MRN: [id number] Date of Birth: 10/01/1951 Referring Provider (PT): Lyndal Pulley, DO   Encounter Date: 02/18/2021   PT End of Session - 02/18/21 1148    Visit Number 5    Number of Visits 12    Date for PT Re-Evaluation 03/31/21    Authorization Type Medicaid [employer] - 12 visits approved    Authorization Time Period 02/03/21 - 04/04/21    Authorization - Visit Number 5    Authorization - Number of Visits 12    Progress Note Due on Visit 10    PT Start Time 9169    PT Stop Time 1229    PT Time Calculation (min) 44 min    Activity Tolerance Patient tolerated treatment well;Patient limited by pain    Behavior During Therapy [facility] for tasks assessed/performed           Past Medical History:  Diagnosis Date  . DVT (deep venous thrombosis) (HCC)    right leg in 2011; prior injury to right foot  . HLD (hyperlipidemia)   . Right foot injury     History reviewed. No pertinent surgical history.  There were no vitals filed for this visit.   Subjective Assessment - 02/18/21 1149    Subjective Patient continues to have pain.    Patient is accompained by: Family member    Pertinent History per Dr Louanne Skye: don't lift > 5lb, no overhead use of UEs, try TENS, cervical traction, McKenzie neck exercises    Limitations Other (comment);Sitting    How long can you sit comfortably? constant pain    How long can you stand comfortably? constant pain    How long can you walk comfortably? constant pain    Diagnostic tests cervical xray 01/16/21, MRI 01/28/21: multi-level disc degen worst at C6/7 with mod Rt foraminal stenosis and mild spinal stenosis    Patient Stated Goals sleep, reduce pain    Currently in Pain? Yes    Pain Score 7     Pain Location Neck    Pain Radiating  Towards bil elbows                             [facility] Adult PT Treatment/Exercise - 02/18/21 0001      Neck Exercises: Supine   Neck Retraction 5 reps;3 secs    Neck Retraction Limitations add extension ROM w/ retraction x5    Other Supine Exercise cervical stabilization w/ bil humeral rotation at sides x10   also cervical stabilization w/ bil humeral rotation at 90 x10   Other Supine Exercise cervical retraction with alternating bil UE flexion x10      Shoulder Exercises: Supine   Horizontal ABduction Both;10 reps;Theraband    Theraband Level (Shoulder Horizontal ABduction) Level 1 (Yellow)    External Rotation Strengthening;10 reps;Theraband    Theraband Level (Shoulder External Rotation) Level 1 (Yellow)    External Rotation Weight (lbs) with elbow flexoin    Other Supine Exercises scapular protraction into mat table x10      Traction   Type of Traction Cervical    Min (lbs) 5    Max (lbs) 13    Hold Time 60    Rest Time 20    Time 15  PT Education - 02/18/21 1210    Education Details addition of scapular retraction to HEP    Person(s) Educated Patient;Caregiver(s)    Methods Explanation;Demonstration;Handout    Comprehension Verbalized understanding            PT Short Term Goals - 02/10/21 1322      PT SHORT TERM GOAL #1   Title Pt ind with initial HEP    Status On-going      PT SHORT TERM GOAL #2   Title Pt will be able to demo improved ability to correct forward head posture with min pain    Status On-going             PT Long Term Goals - 02/03/21 1339      PT LONG TERM GOAL #1   Title Pt will achieve at least 4/5 strength in Rt UE at shoulder and elbow and at least 40lb grip strength with min pain on testing for improved funcitonal use with ADLs.    Time 8    Period Weeks    Status New    Target Date 03/31/21      PT LONG TERM GOAL #2   Title Pt will report at least 50% improved sleep disruption  secondary to improved pain with bed positioning and bed mobility.    Time 8    Period Weeks    Status New    Target Date 03/31/21      PT LONG TERM GOAL #3   Title Pt will report reduced pain by at least 50% with ADLs throughout the day.    Time 8    Period Weeks    Status New    Target Date 03/31/21      PT LONG TERM GOAL #4   Title Pt will be able to perform overhead 3lb transfer x 5 reps with Rt UE for improved functional use of Rt UE.    Time 8    Period Weeks    Status New    Target Date 03/31/21                 Plan - 02/18/21 1149    Clinical Impression Statement Patient reports pain continues to radiate to bil elbows. Is unsure of next step regarding medical intervention for pain. Has improved activity tolerance this session as patient reports no increase in pain following exercise. Continues to require tactile cuing for appropriate recruitment of scapular retractors. Patient notes increased Rt hand numbness following traction which resolved in <20s. Reports minimal decreased in pain following session. Would benefit from continued skilled intervention for pain mitigation to improve quality of life.    Personal Factors and Comorbidities Age    Examination-Activity Limitations Transfers;Sit;Sleep;Lift;Carry;Bed Mobility;Reach Overhead;Bathing;Hygiene/Grooming    Examination-Participation Restrictions Meal Prep;Cleaning;Shop    Rehab Potential Fair    PT Frequency 2x / week    PT Duration 8 weeks    PT Treatment/Interventions ADLs/Self Care Home Management;Electrical Stimulation;Cryotherapy;Moist Heat;Joint Manipulations;Spinal Manipulations;Taping;Passive range of motion;Dry needling;Manual techniques;Patient/family education;Functional mobility training;Therapeutic activities;Therapeutic exercise;Neuromuscular re-education;Iontophoresis 4mg /ml Dexamethasone;Traction    PT Next Visit Plan Assess pain: any reduction, resume some mckenzie exercises.    PT Home Exercise Plan  Access Code: [email or web address], DN info    Consulted and Agree with Plan of Care Patient           Patient will benefit from skilled therapeutic intervention in order to improve the following deficits and impairments:  Decreased range of motion,Impaired tone,Increased muscle spasms,Pain,Impaired UE functional use,Decreased  activity tolerance,Decreased mobility,Decreased strength,Postural dysfunction,Hypomobility  Visit Diagnosis: Radiculopathy, cervical region  Muscle weakness (generalized)  Cramp and spasm     Problem List Patient Active Problem List   Diagnosis Date Noted  . Degenerative cervical disc 01/16/2021  . Right shoulder pain 01/16/2021  . Degenerative arthritis of knee 05/28/2020  . 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 Leg pain, right 10/03/2012  . Leg swelling 10/03/2012  . Right leg DVT ([place]) 10/03/2012  . HLD (hyperlipidemia) 07/25/2012  . Acute thromboembolism of deep veins of lower extremity (HCC) 12/24/2010   Everardo All PT, DPT  02/18/21 12:31 PM Myrene Galas, PTA 02/18/21 12:35 PM  [facility] [address], [place], [place], [address] Phone: [phone]   Fax:  [phone]  Name: Andrew [name] MRN: [id number] Date of Birth: 1951-11-06

## 2021-02-23 ENCOUNTER — Encounter: Payer: Medicaid Other | Admitting: Physical Therapy

## 2021-02-25 ENCOUNTER — Encounter: Payer: Self-pay | Admitting: Physical Therapy

## 2021-02-25 ENCOUNTER — Ambulatory Visit: Payer: Medicare Other | Admitting: Physical Therapy

## 2021-02-25 ENCOUNTER — Other Ambulatory Visit: Payer: Self-pay

## 2021-02-25 DIAGNOSIS — M5412 Radiculopathy, cervical region: Secondary | ICD-10-CM | POA: Diagnosis not present

## 2021-02-25 DIAGNOSIS — M6281 Muscle weakness (generalized): Secondary | ICD-10-CM

## 2021-02-25 DIAGNOSIS — R252 Cramp and spasm: Secondary | ICD-10-CM

## 2021-02-25 NOTE — Therapy (Signed)
[facility] [address] [place], [place], [address] Phone: [phone]   Fax:  [phone]  Physical Therapy Treatment  Patient Details  Name: Andrew [name] MRN: [id number] Date of Birth: 03/01/1951 Referring Provider (PT): Lyndal Pulley, DO   Encounter Date: 02/25/2021   PT End of Session - 02/25/21 1244    Visit Number 6    Number of Visits 12    Date for PT Re-Evaluation 03/31/21    Authorization Type Medicaid [employer] - 12 visits approved    Authorization Time Period 02/03/21 - 04/04/21    Authorization - Visit Number 6    Authorization - Number of Visits 12    Progress Note Due on Visit 10    PT Start Time 2376    PT Stop Time 1240    PT Time Calculation (min) 55 min    Activity Tolerance Patient tolerated treatment well;Patient limited by pain    Behavior During Therapy [facility] for tasks assessed/performed           Past Medical History:  Diagnosis Date  . DVT (deep venous thrombosis) (HCC)    right leg in 2011; prior injury to right foot  . HLD (hyperlipidemia)   . Right foot injury     History reviewed. No pertinent surgical history.  There were no vitals filed for this visit.   Subjective Assessment - 02/25/21 1148    Subjective Pain continues and worsened after traction last time - delayed onset of increased pain.  Worked to help in yard pickng up sticks and avoided use of Rt arm altogether.  Pain continues to be high level 7-8/10 and sleep is disturbed limiting him to 3-4 hours of sleep.    Pertinent History per Dr Louanne Skye: don't lift > 5lb, no overhead use of UEs, try TENS, cervical traction, McKenzie neck exercises    Limitations Other (comment);Sitting    How long can you sit comfortably? constant pain    How long can you stand comfortably? constant pain    How long can you walk comfortably? constant pain    Diagnostic tests cervical xray 01/16/21, MRI 01/28/21: multi-level disc degen worst at  C6/7 with mod Rt foraminal stenosis and mild spinal stenosis    Patient Stated Goals sleep, reduce pain    Currently in Pain? Yes    Pain Score 8     Pain Location Neck    Pain Orientation Right    Pain Descriptors / Indicators Sore;Sharp    Pain Type Acute pain    Pain Onset More than a month ago    Pain Frequency Constant    Aggravating Factors  sleep, sitting, ADLs    Pain Relieving Factors shower, massage              [facility] PT Assessment - 02/25/21 0001      AROM   Right Shoulder Flexion 138 Degrees    Right Shoulder ABduction 158 Degrees    Cervical Flexion 55    Cervical Extension 55    Cervical - Right Side Bend 25    Cervical - Left Side Bend 25    Cervical - Right Rotation 55    Cervical - Left Rotation 50      Strength   Overall Strength Comments 70lb Rt grip, 62lb Lt grip, cervical isometrics 3+/5 limited by pain    Strength Assessment Site Shoulder;Elbow    Right/Left Shoulder Right    Right Shoulder Flexion 4/5  Right Shoulder Extension 4/5    Right Shoulder ABduction 4-/5    Right Shoulder Internal Rotation 4/5    Right Shoulder External Rotation 4-/5    Right/Left Elbow Right    Right Elbow Flexion 4-/5    Right Elbow Extension 4/5                         [facility] Adult PT Treatment/Exercise - 02/25/21 0001      Neck Exercises: Standing   Neck Retraction 10 reps;5 secs    Neck Retraction Limitations into towel for resistance with UE hold ends of towel      Neck Exercises: Seated   Cervical Isometrics Flexion;Extension;Right lateral flexion;Left lateral flexion;Right rotation;Left rotation;3 secs;1 rep    Cervical Isometrics Limitations PT provided manual resistance for this, VC to maintain neck retraction with this      Shoulder Exercises: Standing   External Rotation Strengthening;Both;10 reps;Theraband    Theraband Level (Shoulder External Rotation) Level 1 (Yellow)    External Rotation Limitations 5 sec hold for postural cue,  with neck retraction      Moist Heat Therapy   Number Minutes Moist Heat 10 Minutes    Moist Heat Location Cervical   concurrent with IFC     Electrical Stimulation   Electrical Stimulation Location cervical bil    Electrical Stimulation Action IFC    Electrical Stimulation Goals Pain      Traction   Time hold due to increased delayed onset of pain after last session                    PT Short Term Goals - 02/25/21 1250      PT SHORT TERM GOAL #1   Title Pt ind with initial HEP    Status Achieved      PT SHORT TERM GOAL #2   Title Pt will be able to demo improved ability to correct forward head posture with min pain    Status Achieved      PT SHORT TERM GOAL #3   Title Pt will demo improved cervical ROM to at least 35 flexion, 25 ext, 50 bil Rot with min pain.    Status Achieved             PT Long Term Goals - 02/25/21 1250      PT LONG TERM GOAL #1   Title Pt will achieve at least 4/5 strength in Rt UE at shoulder and elbow and at least 40lb grip strength with min pain on testing for improved funcitonal use with ADLs.    Baseline met for grip (70lb Rt UE), 4-/5 abd, ER, elbow flexion    Status On-going      PT LONG TERM GOAL #2   Title Pt will report at least 50% improved sleep disruption secondary to improved pain with bed positioning and bed mobility.    Baseline no progress    Status On-going      PT LONG TERM GOAL #3   Title Pt will report reduced pain by at least 50% with ADLs throughout the day.    Baseline ongoing high levels of pain    Status On-going      PT LONG TERM GOAL #4   Title Pt will be able to perform overhead 3lb transfer x 5 reps with Rt UE for improved functional use of Rt UE.    Status New  Plan - 02/25/21 1244    Clinical Impression Statement Pt has made significant gains in objective measures including all planes of cervical A/ROM, Rt shoulder A/ROM, neck isometric strength, Rt grip strength, and Rt  UE strength.  He improved Rt grip strength from 22lb to 70lb, now exceeding stength of Lt non-dominant hand (62lb).  He has much improved posture and postural awareness with compliance with HEP despite ongoing high levels of pain.  He continues to rate pain at 7-8/10 with significant sleep disturbance to 3-4 hours/night.  PT held traction today due to Pt feedback that he had increased delayed onset of pain after last visit.  Session focused on neuro re-ed to progress neck retraction in gravity with towel used for resistance, standing postural holds of bil shoulder ER with yellow band in conjunction with active retraction, and PT assisted manual resistance for cervical isometrics all directions.  Re-visited use of IFC and heat end of session given ongoing pain levels.  Pt with one more PT visit before seeing Dr. Louanne Skye late next week.  Pt will continue to benefit from close monitoring and progression of stabilization as tol.    Personal Factors and Comorbidities Age    Examination-Activity Limitations Transfers;Sit;Sleep;Lift;Carry;Bed Mobility;Reach Overhead;Bathing;Hygiene/Grooming    Examination-Participation Restrictions Meal Prep;Cleaning;Shop    PT Frequency 2x / week    PT Duration 8 weeks    PT Treatment/Interventions ADLs/Self Care Home Management;Electrical Stimulation;Cryotherapy;Moist Heat;Joint Manipulations;Spinal Manipulations;Taping;Passive range of motion;Dry needling;Manual techniques;Patient/family education;Functional mobility training;Therapeutic activities;Therapeutic exercise;Neuromuscular re-education;Iontophoresis 4mg /ml Dexamethasone;Traction    PT Next Visit Plan continue IFC/heat, try standing cervical isometrics to add to HEP, progress gentle postural stab    PT Home Exercise Plan Access Code: [email or web address], DN info    Consulted and Agree with Plan of Care Patient           Patient will benefit from skilled therapeutic intervention in order to improve the following deficits and  impairments:  Decreased range of motion,Impaired tone,Increased muscle spasms,Pain,Impaired UE functional use,Decreased activity tolerance,Decreased mobility,Decreased strength,Postural dysfunction,Hypomobility  Visit Diagnosis: Radiculopathy, cervical region  Muscle weakness (generalized)  Cramp and spasm     Problem List Patient Active Problem List   Diagnosis Date Noted  . Degenerative cervical disc 01/16/2021  . Right shoulder pain 01/16/2021  . Degenerative arthritis of knee 05/28/2020  . 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 Leg pain, right 10/03/2012  . Leg swelling 10/03/2012  . Right leg DVT ([place]) 10/03/2012  . HLD (hyperlipidemia) 07/25/2012  . Acute thromboembolism of deep veins of lower extremity ([place]) 12/24/2010    Fraidy Mccarrick, PT 02/25/21 12:53 PM   [facility] [address], [place], [place], [address] Phone: [phone]   Fax:  [phone]  Name: Andrew [name] MRN: [id number] Date of Birth: Mar 19, 1951

## 2021-02-26 ENCOUNTER — Ambulatory Visit: Payer: Medicaid Other | Admitting: Specialist

## 2021-02-26 NOTE — Progress Notes (Deleted)
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  X-rays are pending.  Patient has had many different comorbidities over the course of time. Discussed that likely this is causing some of the radicular symptoms.  Started on gabapentin.  We have done for his lower back previously and we will see if this makes any improvement.  Discussed potential side effects.  Patient is accompanied with brother who did help with translation.  Follow-up with me again in 6 weeks  Patient is having right shoulder pain as well.  Could be more secondary to cervical radiculopathy but does have some positive impingement noted.  We will get x-rays to further evaluate.  Patient given home exercises to work on scapular strengthening.  Patient has had muscle relaxers previously that could be beneficial as well.  Discussed icing regimen.  Discussed formal physical therapy and will be referred today.  Follow-up again 4 to 8 weeks.  Update 12/14/5850 Andrew [name] is a 70 y.o. male coming in with complaint of right shoulder pain. Patient has been doing PT. Patient states   Onset-  Location Duration-  Character- Aggravating factors- Reliving factors-  Therapies tried-  Severity-     Past Medical History:  Diagnosis Date  . DVT (deep venous thrombosis) (HCC)    right leg in 2011; prior injury to right foot  . HLD (hyperlipidemia)   . Right foot injury    No past surgical history on file. Social History   Socioeconomic History  . Marital status: Divorced    Spouse name: Not on file  . Number of children: Not on file  . Years of education: Not on file  . Highest education level: Not on file  Occupational History  . Not on file  Tobacco Use  . Smoking status: Former Smoker     Types: Cigarettes    Quit date: 07/25/1982    Years since quitting: 38.6  . Smokeless tobacco: Never Used  . Tobacco comment: REMOTE SOCIAL HISTORY  Substance and Sexual Activity  . Alcohol use: No  . Drug use: No  . Sexual activity: Not Currently  Other Topics Concern  . Not on file  Social History Narrative  . Not on file   Social Determinants of Health   Financial Resource Strain: Not on file  Food Insecurity: Not on file  Transportation Needs: Not on file  Physical Activity: Not on file  Stress: Not on file  Social Connections: Not on file   Allergies  Allergen Reactions  . Penicillins Anaphylaxis  . Influenza Vaccines     Broke out in hives and had breathing problems   Family History  Problem Relation Age of Onset  . Heart disease Mother   . Heart disease Father   . Vascular Disease Maternal Grandfather     Current Outpatient Medications (Endocrine & Metabolic):  .  methylPREDNISolone (MEDROL) 4 MG tablet, 6-day taper to be taken as directed  Current Outpatient Medications (Cardiovascular):  .  atorvastatin (LIPITOR) 40 MG tablet, TAKE 1 TABLET (40 MG TOTAL) BY MOUTH DAILY AT 6 PM.   Current Outpatient Medications (Analgesics):  .  allopurinol (ZYLOPRIM) 100 MG tablet, Take 1 tablet (100 mg total) by mouth daily. .  diclofenac (CATAFLAM) 50 MG tablet, Take 1 tablet (50 mg  total) by mouth 2 (two) times daily. .  traMADol (ULTRAM) 50 MG tablet, Take 1 tablet (50 mg total) by mouth every 12 (twelve) hours as needed. .  traMADol (ULTRAM) 50 MG tablet, Take 1 tablet (50 mg total) by mouth every 8 (eight) hours as needed.  Current Outpatient Medications (Hematological):  .  rivaroxaban (XARELTO) 10 MG TABS tablet, Take 1 tablet (10 mg total) by mouth daily.  Current Outpatient Medications (Other):  .  gabapentin (NEURONTIN) 100 MG capsule, Take 2 capsules (200 mg total) by mouth at bedtime. .  gabapentin (NEURONTIN) 300 MG capsule, Take 1 capsule (300 mg total) by  mouth at bedtime. Marland Kitchen  tiZANidine (ZANAFLEX) 4 MG tablet, Take 1 tablet by mouth as needed.   Reviewed prior external information including notes and imaging from  primary care provider As well as notes that were available from care everywhere and other healthcare systems.  Past medical history, social, surgical and family history all reviewed in electronic medical record.  No pertanent information unless stated regarding to the chief complaint.   Review of Systems:  No headache, visual changes, nausea, vomiting, diarrhea, constipation, dizziness, abdominal pain, skin rash, fevers, chills, night sweats, weight loss, swollen lymph nodes, body aches, joint swelling, chest pain, shortness of breath, mood changes. POSITIVE muscle aches  Objective  There were no vitals taken for this visit.   General: No apparent distress alert and oriented x3 mood and affect normal, dressed appropriately.  HEENT: Pupils equal, extraocular movements intact  Respiratory: Patient's speak in full sentences and does not appear short of breath  Cardiovascular: No lower extremity edema, non tender, no erythema  Gait normal with good balance and coordination.  MSK:  Non tender with full range of motion and good stability and symmetric strength and tone of shoulders, elbows, wrist, hip, knee and ankles bilaterally.     Impression and Recommendations:     The above documentation has been reviewed and is accurate and complete Jacqualin Combes

## 2021-02-27 ENCOUNTER — Telehealth: Payer: Self-pay

## 2021-02-27 ENCOUNTER — Ambulatory Visit: Payer: Medicaid Other | Admitting: Family Medicine

## 2021-02-27 DIAGNOSIS — E782 Mixed hyperlipidemia: Secondary | ICD-10-CM

## 2021-02-27 NOTE — Telephone Encounter (Signed)
Spoke with the patient's brother, Laqueta Due. He spoke with Rhodia Albright and confirmed he is taking atorvastatin 40 mg daily and has not missed any doses.  He understands they will be called next week with Dr. Antionette Char recommendations since his LDL was 150 on recent check with PCP.

## 2021-03-02 ENCOUNTER — Other Ambulatory Visit: Payer: Self-pay

## 2021-03-02 ENCOUNTER — Ambulatory Visit: Payer: Medicare Other | Admitting: Physical Therapy

## 2021-03-02 ENCOUNTER — Encounter: Payer: Self-pay | Admitting: Physical Therapy

## 2021-03-02 DIAGNOSIS — M6281 Muscle weakness (generalized): Secondary | ICD-10-CM

## 2021-03-02 DIAGNOSIS — M5412 Radiculopathy, cervical region: Secondary | ICD-10-CM

## 2021-03-02 NOTE — Telephone Encounter (Signed)
Scheduled the patient for repeat fasting blood work 03/05/21. DPR (the patient's brother) was grateful for call and agrees with plan.

## 2021-03-02 NOTE — Patient Instructions (Signed)
Access Code: [email or web address] URL: [email or web address] Date: 03/02/2021 Prepared by: Venetia Night Syndey Jaskolski  Exercises Seated Scapular Retraction - 3 x daily - 7 x weekly - 1 sets - 10 reps Standing Cervical Retraction - 3 x daily - 7 x weekly - 1 sets - 10 reps - 1 sec hold Seated Cervical Retraction and Extension - 3 x daily - 7 x weekly - 1 sets - 10 reps - 1 sec hold Supine Shoulder Horizontal Abduction with Resistance - 3 x daily - 7 x weekly - 2 sets - 10 reps Supine Shoulder External Rotation with Resistance - 3 x daily - 7 x weekly - 2 sets - 10 reps Dead Bug Alternating Arm Extension - 3 x daily - 7 x weekly - 2 sets - 10 reps Cervical Retraction with Resistance - 3 x daily - 7 x weekly - 1 sets - 10 reps - 5 hold Shoulder External Rotation and Scapular Retraction with Resistance - 1 x daily - 7 x weekly - 1 sets - 10 reps - 5 hold Standing Row with Anchored Resistance - 1 x daily - 7 x weekly - 1 sets - 10 reps Standing Tricep Extensions with Resistance - 1 x daily - 7 x weekly - 1 sets - 10 reps Standing Bicep Curls with Resistance - 1 x daily - 7 x weekly - 1 sets - 10 reps Standing Shoulder Extension with Resistance - 1 x daily - 7 x weekly - 1 sets - 10 reps

## 2021-03-02 NOTE — Telephone Encounter (Signed)
Would repeat lipid/LFT. Thank you  Reviewed labs. LDL has been in the 85-88 range the past few years now 150. thx

## 2021-03-02 NOTE — Therapy (Signed)
[facility] [address], [place], [place], [address] Phone: [phone]   Fax:  [phone]  Physical Therapy Treatment  Patient Details  Name: Andrew [name] MRN: [id number] Date of Birth: 1951-12-12 Referring Provider (PT): Lyndal Pulley, DO   Encounter Date: 03/02/2021   PT End of Session - 03/02/21 1102    Visit Number 7    Number of Visits 12    Date for PT Re-Evaluation 03/31/21    Authorization Type Medicaid [employer] - 12 visits approved    Authorization Time Period 02/03/21 - 04/04/21    Authorization - Visit Number 7    Authorization - Number of Visits 12    Progress Note Due on Visit 10    PT Start Time 1102    PT Stop Time 1145    PT Time Calculation (min) 43 min    Activity Tolerance Patient tolerated treatment well;Patient limited by pain    Behavior During Therapy [facility] for tasks assessed/performed           Past Medical History:  Diagnosis Date  . DVT (deep venous thrombosis) (HCC)    right leg in 2011; prior injury to right foot  . HLD (hyperlipidemia)   . Right foot injury     History reviewed. No pertinent surgical history.  There were no vitals filed for this visit.   Subjective Assessment - 03/02/21 1103    Subjective We got the TENS unit and it helps.  Only wearing x 15 min for now.  Pt uses small decorative pillow at night but not interested in changing.  Ongoing signif sleep disturbance at night.    Pertinent History per Dr Louanne Skye: don't lift > 5lb, no overhead use of UEs, try TENS, cervical traction, McKenzie neck exercises    Limitations Other (comment);Sitting    How long can you sit comfortably? constant pain    How long can you stand comfortably? constant pain    How long can you walk comfortably? constant pain    Diagnostic tests cervical xray 01/16/21, MRI 01/28/21: multi-level disc degen worst at C6/7 with mod Rt foraminal stenosis and mild spinal stenosis    Patient Stated  Goals sleep, reduce pain    Currently in Pain? Yes    Pain Score 8     Pain Location Neck    Pain Orientation Right    Pain Descriptors / Indicators Sore;Sharp    Pain Type Acute pain    Pain Radiating Towards bil elbows/Rt shoulder    Pain Onset More than a month ago    Pain Frequency Constant              [facility] PT Assessment - 03/02/21 0001      Assessment   Medical Diagnosis M54.12 (ICD-10-CM) - Cervical radiculopathy    Referring Provider (PT) Lyndal Pulley, DO    Onset Date/Surgical Date --   3 mos   Hand Dominance Right    Next MD Visit Dr Louanne Skye in 2 weeks for MRI review    Prior Therapy no      ROM / Strength   AROM / PROM / Strength AROM;Strength      AROM   Right Shoulder Flexion 138 Degrees    Right Shoulder ABduction 158 Degrees    Cervical Flexion 55    Cervical Extension 55    Cervical - Right Side Bend 25    Cervical - Left Side Bend 25    Cervical - Right  Rotation 55    Cervical - Left Rotation 50      Strength   Overall Strength Comments 70lb Rt grip, 62lb Lt grip, cervical isometrics 3+/5 limited by pain    Right Shoulder Flexion 4/5    Right Shoulder Extension 4/5    Right Shoulder ABduction 4-/5    Right Shoulder Internal Rotation 4/5    Right Shoulder External Rotation 4-/5    Right Elbow Flexion 4-/5    Right Elbow Extension 4/5                         [facility] Adult PT Treatment/Exercise - 03/02/21 0001      Exercises   Exercises Neck;Shoulder      Neck Exercises: Machines for Strengthening   UBE (Upper Arm Bike) L1 alt dirc each min x 6' PT present to monitor pain and posture      Shoulder Exercises: Standing   Horizontal ABduction Strengthening;Right;10 reps    Horizontal ABduction Limitations PT TC for scapular retraction, bent over Lt UE on counter    External Rotation Strengthening;Right;Theraband;20 reps    Theraband Level (Shoulder External Rotation) Level 1 (Yellow)    External Rotation Limitations 2x10     Internal Rotation Strengthening;Right;Theraband;20 reps    Theraband Level (Shoulder Internal Rotation) Level 1 (Yellow)    Internal Rotation Limitations 2x10    Extension Strengthening;Both;15 reps;Theraband    Theraband Level (Shoulder Extension) Level 3 (Green)    Row Strengthening;Both;15 reps;Theraband    Theraband Level (Shoulder Row) Level 4 (Blue)    Other Standing Exercises tricep ext green x 15 bil    Other Standing Exercises bicep curl bil standing on yellow band x 15      Shoulder Exercises: ROM/Strengthening   Ball on Wall 20x each way at 90 deg, Rt (towel used instead of ball)      Modalities   Modalities Moist Heat      Moist Heat Therapy   Number Minutes Moist Heat 5 Minutes    Moist Heat Location Cervical                  PT Education - 03/02/21 1300    Education Details progressed scapular, shoulder and elbow strength with bands today    Person(s) Educated Patient;Other (comment)   Pt's brother   Methods Explanation;Demonstration;Handout;Tactile cues;Verbal cues    Comprehension Verbalized understanding;Returned demonstration            PT Short Term Goals - 02/25/21 1250      PT SHORT TERM GOAL #1   Title Pt ind with initial HEP    Status Achieved      PT SHORT TERM GOAL #2   Title Pt will be able to demo improved ability to correct forward head posture with min pain    Status Achieved      PT SHORT TERM GOAL #3   Title Pt will demo improved cervical ROM to at least 35 flexion, 25 ext, 50 bil Rot with min pain.    Status Achieved             PT Long Term Goals - 02/25/21 1250      PT LONG TERM GOAL #1   Title Pt will achieve at least 4/5 strength in Rt UE at shoulder and elbow and at least 40lb grip strength with min pain on testing for improved funcitonal use with ADLs.    Baseline met for grip (70lb Rt UE),  4-/5 abd, ER, elbow flexion    Status On-going      PT LONG TERM GOAL #2   Title Pt will report at least 50% improved sleep  disruption secondary to improved pain with bed positioning and bed mobility.    Baseline no progress    Status On-going      PT LONG TERM GOAL #3   Title Pt will report reduced pain by at least 50% with ADLs throughout the day.    Baseline ongoing high levels of pain    Status On-going      PT LONG TERM GOAL #4   Title Pt will be able to perform overhead 3lb transfer x 5 reps with Rt UE for improved functional use of Rt UE.    Status New                 Plan - 03/02/21 1301    Clinical Impression Statement Pt continues to report high levels of pain rated 7-8/10 concentrated along Rt cervical, intrascapular and shoulder region.  He has signif sleep disruption due to pain.  PT tried to recommend pillows today but Pt expressed disinterest in changing away from current small "decorative" pillow as was described by his brother who served as interpreter.  Pt has significantly improved objective measures including cervical ROM in all planes, Rt shoulder A/ROM, neck isometric strength, Rt grip strength, and Rt UE strength, despite ongoing high levels of pain. He improved Rt grip strength from 22lb to 70lb, now exceeding stength of Lt non-dominant hand (62lb). He has much improved posture, correcting forward head without pain in neutral alignment.  He has TENS unit which he is using at home per PT parameters recommended last visit.  Pt arrived with 7-8/10 and for first time within a session reported reduction of pain within visit as he progressed through theraband scapular and UE strengthening.  Pain was down to 5-6/10 end of session but Pt did report onset of Rt intrascapular numbness with ther ex.  Pt sees Dr. Louanne Skye later this week to discuss ongoing symptoms.  Pt will likely continue to benefit from skilled PT as he is making gains in objective measures, although high pain levels do limit him from sleeping well or using Rt UE for functional tasks.    Rehab Potential Fair    PT Frequency 2x / week     PT Duration 8 weeks    PT Treatment/Interventions ADLs/Self Care Home Management;Electrical Stimulation;Cryotherapy;Moist Heat;Joint Manipulations;Spinal Manipulations;Taping;Passive range of motion;Dry needling;Manual techniques;Patient/family education;Functional mobility training;Therapeutic activities;Therapeutic exercise;Neuromuscular re-education;Iontophoresis 110m/ml Dexamethasone;Traction    PT Next Visit Plan f/u on MD visit and review HEP, continue Rt GH, Scapular and cervical stabilizaiton, modalities for pain    PT Home Exercise Plan Access Code: [email or web address], DN info    Consulted and Agree with Plan of Care Patient           Patient will benefit from skilled therapeutic intervention in order to improve the following deficits and impairments:     Visit Diagnosis: Radiculopathy, cervical region  Muscle weakness (generalized)     Problem List Patient Active Problem List   Diagnosis Date Noted  . Degenerative cervical disc 01/16/2021  . Right shoulder pain 01/16/2021  . Degenerative arthritis of knee 05/28/2020  . 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 Leg pain, right 10/03/2012  . Leg swelling 10/03/2012  . Right leg DVT ([place] 10/03/2012  . HLD (hyperlipidemia) 07/25/2012  . Acute thromboembolism of deep veins of  lower extremity ([place]) 12/24/2010    Donnette Macmullen, PT 03/02/21 1:10 PM    [facility] [address] [place], [place], [address] Phone: [phone]   Fax:  [phone]  Name: Andrew [name] MRN: [id number] Date of Birth: [birth year]

## 2021-03-04 ENCOUNTER — Encounter: Payer: Medicaid Other | Admitting: Physical Therapy

## 2021-03-05 ENCOUNTER — Encounter: Payer: Self-pay | Admitting: Specialist

## 2021-03-05 ENCOUNTER — Other Ambulatory Visit: Payer: Medicaid Other

## 2021-03-05 ENCOUNTER — Ambulatory Visit (INDEPENDENT_AMBULATORY_CARE_PROVIDER_SITE_OTHER): Payer: Medicare Other | Admitting: Specialist

## 2021-03-05 ENCOUNTER — Other Ambulatory Visit: Payer: Self-pay

## 2021-03-05 VITALS — BP 107/69 | HR 65 | Ht 70.0 in | Wt 195.0 lb

## 2021-03-05 DIAGNOSIS — E782 Mixed hyperlipidemia: Secondary | ICD-10-CM

## 2021-03-05 DIAGNOSIS — M5412 Radiculopathy, cervical region: Secondary | ICD-10-CM | POA: Diagnosis not present

## 2021-03-05 DIAGNOSIS — M47816 Spondylosis without myelopathy or radiculopathy, lumbar region: Secondary | ICD-10-CM

## 2021-03-05 LAB — LIPID PANEL
Chol/HDL Ratio: 3 ratio (ref 0.0–5.0)
Cholesterol, Total: 167 mg/dL (ref 100–199)
HDL: 56 mg/dL (ref >39)
LDL Chol Calc (NIH): 95 mg/dL (ref 0–99)
Triglycerides: 84 mg/dL (ref 0–149)
VLDL Cholesterol Cal: 16 mg/dL (ref 5–40)

## 2021-03-05 LAB — HEPATIC FUNCTION PANEL
ALT: 33 IU/L (ref 0–44)
AST: 26 IU/L (ref 0–40)
Albumin: 4.2 g/dL (ref 3.8–4.8)
Alkaline Phosphatase: 46 IU/L (ref 44–121)
Bilirubin Total: 0.7 mg/dL (ref 0.0–1.2)
Bilirubin, Direct: 0.19 mg/dL (ref 0.00–0.40)
Total Protein: 6.5 g/dL (ref 6.0–8.5)

## 2021-03-05 MED ORDER — TIZANIDINE HCL 4 MG PO TABS: 4.0000 mg | ORAL_TABLET | ORAL | 1 refills | Status: DC | PRN

## 2021-03-05 MED ORDER — GABAPENTIN 300 MG PO CAPS: 300.0000 mg | ORAL_CAPSULE | Freq: Every day | ORAL | 2 refills | Status: DC

## 2021-03-05 MED ORDER — TRAMADOL HCL 50 MG PO TABS: 50.0000 mg | ORAL_TABLET | Freq: Three times a day (TID) | ORAL | 0 refills | Status: DC | PRN

## 2021-03-05 MED ORDER — METHYLPREDNISOLONE 4 MG PO TBPK
ORAL_TABLET | ORAL | 0 refills | Status: DC
Start: 2021-03-05 — End: 2021-05-13

## 2021-03-05 NOTE — Patient Instructions (Signed)
Plan:Avoid overhead lifting and overhead use of the arms. Do not lift greater than 5 lbs. Adjust head rest in vehicle to prevent hyperextension if rear ended. Take extra precautions to avoid falling. Physical therapy for TENs unit eval, cervical traction and McKenzie exercises.  Continue abapentin 300 mg po qhs. Discontinue with arthritis medication cataflam because it increases risk of bleeding while taking xarelto. Start medrol pak.

## 2021-03-05 NOTE — Progress Notes (Signed)
Office Visit Note   Patient: Andrew [name]           Date of Birth: 1951-11-13           MRN: [id number] Visit Date: 03/05/2021              Requested by: Bernerd Limbo, MD [place],  [place] [address] PCP: Bernerd Limbo, MD   Assessment & Plan: Visit Diagnoses:  1. Spondylosis without myelopathy or radiculopathy, lumbar region   2. Radiculopathy, cervical region     Plan:Avoid overhead lifting and overhead use of the arms. Do not lift greater than 5 lbs. Adjust head rest in vehicle to prevent hyperextension if rear ended. Take extra precautions to avoid falling. Physical therapy for TENs unit eval, cervical traction and McKenzie exercises.  Continue abapentin 300 mg po qhs. Discontinue with arthritis medication cataflam because it increases risk of bleeding while taking xarelto. Start medrol pak.   Follow-Up Instructions: No follow-ups on file.   Orders:  No orders of the defined types were placed in this encounter.  Meds ordered this encounter  Medications   methylPREDNISolone (MEDROL DOSEPAK) 4 MG TBPK tablet    Sig: Take as directed a 6 day dose pak    Dispense:  21 tablet    Refill:  0   traMADol (ULTRAM) 50 MG tablet    Sig: Take 1 tablet (50 mg total) by mouth every 8 (eight) hours as needed.    Dispense:  40 tablet    Refill:  0   tiZANidine (ZANAFLEX) 4 MG tablet    Sig: Take 1 tablet (4 mg total) by mouth as needed.    Dispense:  30 tablet    Refill:  1   gabapentin (NEURONTIN) 300 MG capsule    Sig: Take 1 capsule (300 mg total) by mouth at bedtime.    Dispense:  30 capsule    Refill:  2      Procedures: No procedures performed   Clinical Data: No additional findings.   Subjective: Chief Complaint  Patient presents with   Neck - Follow-up    PT seems to be helping with ROM    HPI  Review of Systems   Objective: Vital Signs: BP 107/69 (BP Location: Left Arm, Patient Position: Sitting)    Pulse 65     Ht 5\' 10"  (1.778 m)    Wt 195 lb (88.5 kg)    BMI 27.98 kg/m   Physical Exam  Ortho Exam  Specialty Comments:  No specialty comments available.  Imaging: No results found.   PMFS History: Patient Active Problem List   Diagnosis Date Noted   Degenerative cervical disc 01/16/2021   Right shoulder pain 01/16/2021   Degenerative arthritis of knee 05/28/2020   Spondylosis without myelopathy or radiculopathy, lumbar region 10/11/2016   Leg pain, right 10/03/2012   Leg swelling 10/03/2012   Right leg DVT ([place]) 10/03/2012   HLD (hyperlipidemia) 07/25/2012   Acute thromboembolism of deep veins of lower extremity ([place]) 12/24/2010   Past Medical History:  Diagnosis Date   DVT (deep venous thrombosis) (HCC)    right leg in 2011; prior injury to right foot   HLD (hyperlipidemia)    Right foot injury     Family History  Problem Relation Age of Onset   Heart disease Mother    Heart disease Father    Vascular Disease Maternal Grandfather     History reviewed. No pertinent surgical history. Social History  Occupational History   Not on file  Tobacco Use   Smoking status: Former Smoker    Types: Cigarettes    Quit date: 07/25/1982    Years since quitting: 38.6   Smokeless tobacco: Never Used   Tobacco comment: REMOTE SOCIAL HISTORY  Substance and Sexual Activity   Alcohol use: No   Drug use: No   Sexual activity: Not Currently

## 2021-03-11 ENCOUNTER — Encounter: Payer: Medicaid Other | Admitting: Physical Therapy

## 2021-03-13 ENCOUNTER — Encounter: Payer: Self-pay | Admitting: Physical Therapy

## 2021-03-13 ENCOUNTER — Other Ambulatory Visit: Payer: Self-pay | Admitting: Specialist

## 2021-03-13 ENCOUNTER — Other Ambulatory Visit: Payer: Self-pay

## 2021-03-13 ENCOUNTER — Ambulatory Visit: Payer: Medicare Other | Attending: Family Medicine | Admitting: Physical Therapy

## 2021-03-13 DIAGNOSIS — M5412 Radiculopathy, cervical region: Secondary | ICD-10-CM | POA: Insufficient documentation

## 2021-03-13 DIAGNOSIS — M542 Cervicalgia: Secondary | ICD-10-CM | POA: Insufficient documentation

## 2021-03-13 DIAGNOSIS — M6281 Muscle weakness (generalized): Secondary | ICD-10-CM | POA: Diagnosis present

## 2021-03-13 DIAGNOSIS — R252 Cramp and spasm: Secondary | ICD-10-CM

## 2021-03-13 DIAGNOSIS — R293 Abnormal posture: Secondary | ICD-10-CM | POA: Diagnosis present

## 2021-03-13 DIAGNOSIS — R2689 Other abnormalities of gait and mobility: Secondary | ICD-10-CM | POA: Diagnosis not present

## 2021-03-13 NOTE — Therapy (Signed)
[facility] [address], [place], [place], [address] Phone: [phone]   Fax:  [phone]  Physical Therapy Treatment  Patient Details  Name: Andrew [name] MRN: [id number] Date of Birth: 05-02-1951 Referring Provider (PT): Lyndal Pulley, DO   Encounter Date: 03/13/2021   PT End of Session - 03/13/21 1106    Visit Number 8    Number of Visits 12    Date for PT Re-Evaluation 03/31/21    Authorization Type Medicaid [employer] - 12 visits approved    Authorization Time Period 02/03/21 - 04/04/21    Authorization - Visit Number 8    Authorization - Number of Visits 12    Progress Note Due on Visit 10    PT Start Time 1100    PT Stop Time 1142    PT Time Calculation (min) 42 min    Activity Tolerance Patient tolerated treatment well;Patient limited by pain    Behavior During Therapy [facility] for tasks assessed/performed           Past Medical History:  Diagnosis Date  . DVT (deep venous thrombosis) (HCC)    right leg in 2011; prior injury to right foot  . HLD (hyperlipidemia)   . Right foot injury     History reviewed. No pertinent surgical history.  There were no vitals filed for this visit.   Subjective Assessment - 03/13/21 1104    Subjective On day 3 of prednisone pack but pain is still 7-8/10.  Exercise brings it down to a 3-/410.    Pertinent History per Dr Louanne Skye: don't lift > 5lb, no overhead use of UEs, try TENS, cervical traction, McKenzie neck exercises    Limitations Other (comment);Sitting    How long can you sit comfortably? constant pain    How long can you stand comfortably? constant pain    How long can you walk comfortably? constant pain    Diagnostic tests cervical xray 01/16/21, MRI 01/28/21: multi-level disc degen worst at C6/7 with mod Rt foraminal stenosis and mild spinal stenosis    Patient Stated Goals sleep, reduce pain    Currently in Pain? Yes    Pain Score 8     Pain Location Neck     Pain Orientation Right    Pain Descriptors / Indicators Sore;Sharp    Pain Type Acute pain    Pain Onset More than a month ago    Pain Frequency Constant    Aggravating Factors  sleep, sit, ADLs    Pain Relieving Factors TENS, HEP, shower    Effect of Pain on Daily Activities sleep, ADLs                             [facility] Adult PT Treatment/Exercise - 03/13/21 0001      Self-Care   Self-Care Other Self-Care Comments    Other Self-Care Comments  tennis ball against wall self-massage Rt intrascapular as needed for soft tissue release      Exercises   Exercises Neck;Shoulder      Neck Exercises: Machines for Strengthening   UBE (Upper Arm Bike) L1.5 3x3 fwd/bwd PT present to obtain history and symptoms      Shoulder Exercises: Supine   Flexion Strengthening;Right;20 reps;Weights    Shoulder Flexion Weight (lbs) 3    Flexion Limitations 2x10    Other Supine Exercises chest press dowel +5lb ankle weight x 10, some Rt shoulder pain  Shoulder Exercises: Prone   Extension Strengthening;Right;Weights;10 reps    Extension Weight (lbs) 3    Horizontal ABduction 1 AROM;Right;10 reps      Shoulder Exercises: Standing   Other Standing Exercises wall push up x 10, then counter plank walk x 3 rounds    Other Standing Exercises green loop ER isom with bent elbow flexion for serratus anterior x 10 reps      Shoulder Exercises: ROM/Strengthening   Rhythmic Stabilization, Supine circles 3# Rt UE 2x10 each way      Shoulder Exercises: Power [employer] worker, community 15 reps    Extension Limitations both, 20lb    Row 10 reps    Row Limitations bil, then Rt/Lt each with rotation, 30#    External Rotation 10 reps    External Rotation Limitations 15lb, Rt                    PT Short Term Goals - 02/25/21 1250      PT SHORT TERM GOAL #1   Title Pt ind with initial HEP    Status Achieved      PT SHORT TERM GOAL #2   Title Pt will be able to demo improved ability  to correct forward head posture with min pain    Status Achieved      PT SHORT TERM GOAL #3   Title Pt will demo improved cervical ROM to at least 35 flexion, 25 ext, 50 bil Rot with min pain.    Status Achieved             PT Long Term Goals - 02/25/21 1250      PT LONG TERM GOAL #1   Title Pt will achieve at least 4/5 strength in Rt UE at shoulder and elbow and at least 40lb grip strength with min pain on testing for improved funcitonal use with ADLs.    Baseline met for grip (70lb Rt UE), 4-/5 abd, ER, elbow flexion    Status On-going      PT LONG TERM GOAL #2   Title Pt will report at least 50% improved sleep disruption secondary to improved pain with bed positioning and bed mobility.    Baseline no progress    Status On-going      PT LONG TERM GOAL #3   Title Pt will report reduced pain by at least 50% with ADLs throughout the day.    Baseline ongoing high levels of pain    Status On-going      PT LONG TERM GOAL #4   Title Pt will be able to perform overhead 3lb transfer x 5 reps with Rt UE for improved functional use of Rt UE.    Status New                 Plan - 03/13/21 1108    Clinical Impression Statement Pt saw MD and he was pleased with Pt's progress for objective measures.  He put Pt on prednisone taper pack and Pt is on day 3 without relief so far.  Pt arrives with 7-8/10 but does report reduced pain to 4-5/10 consistently with ther ex.  He is using TENS at home for 30-60' daily with relief.  He has improving shoulder, elbow and grip strength but needs ongoing cueing for intrascapular stabilization during ther ex.  VC and demo used for eccentric control with power tower ther ex today.  Increased resistance and time on UBE today with good tolerance.  Pt will continue to benefit from strengthening and postural re-ed to see if this reduces his pain as stabilization improves.    PT Frequency 2x / week    PT Duration 8 weeks    PT Treatment/Interventions  ADLs/Self Care Home Management;Electrical Stimulation;Cryotherapy;Moist Heat;Joint Manipulations;Spinal Manipulations;Taping;Passive range of motion;Dry needling;Manual techniques;Patient/family education;Functional mobility training;Therapeutic activities;Therapeutic exercise;Neuromuscular re-education;Iontophoresis 67m/ml Dexamethasone;Traction    PT Next Visit Plan continue power tower and scapular/Rt shoulder strength    PT Home Exercise Plan Access Code: [email or web address] DN info    Consulted and Agree with Plan of Care Patient           Patient will benefit from skilled therapeutic intervention in order to improve the following deficits and impairments:     Visit Diagnosis: Radiculopathy, cervical region  Muscle weakness (generalized)  Cramp and spasm     Problem List Patient Active Problem List   Diagnosis Date Noted  . Degenerative cervical disc 01/16/2021  . Right shoulder pain 01/16/2021  . Degenerative arthritis of knee 05/28/2020  . 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 Leg pain, right 10/03/2012  . Leg swelling 10/03/2012  . Right leg DVT ([place] 10/03/2012  . HLD (hyperlipidemia) 07/25/2012  . Acute thromboembolism of deep veins of lower extremity (HCC) 12/24/2010    Roselie Cirigliano, PT 03/13/21 11:49 AM   [facility] [address] [place] [address]: [phone]  Fax:  [phone] Name: ALourdes ManningMRN: [id number] of Birth: [birth year]

## 2021-03-16 ENCOUNTER — Encounter: Payer: Medicaid Other | Admitting: Physical Therapy

## 2021-03-18 ENCOUNTER — Ambulatory Visit: Payer: Medicare Other | Admitting: Physical Therapy

## 2021-03-18 ENCOUNTER — Other Ambulatory Visit: Payer: Self-pay

## 2021-03-18 ENCOUNTER — Encounter: Payer: Self-pay | Admitting: Physical Therapy

## 2021-03-18 DIAGNOSIS — M6281 Muscle weakness (generalized): Secondary | ICD-10-CM

## 2021-03-18 DIAGNOSIS — M5412 Radiculopathy, cervical region: Secondary | ICD-10-CM | POA: Diagnosis not present

## 2021-03-18 DIAGNOSIS — R252 Cramp and spasm: Secondary | ICD-10-CM

## 2021-03-18 NOTE — Therapy (Signed)
[facility] [address] [place], [place], [address] Phone: [phone]   Fax:  [phone]  Physical Therapy Treatment  Patient Details  Name: Andrew [name] MRN: [id number] Date of Birth: 1951/12/01 Referring Provider (PT): Lyndal Pulley, DO   Encounter Date: 03/18/2021   PT End of Session - 03/18/21 1105    Visit Number 9    Number of Visits 12    Date for PT Re-Evaluation 03/31/21    Authorization Type Medicaid [employer] - 12 visits approved    Authorization Time Period 02/03/21 - 04/04/21    Authorization - Visit Number 9    Authorization - Number of Visits 12    Progress Note Due on Visit 10    PT Start Time 0086    PT Stop Time 1140   shorter session due to pain   PT Time Calculation (min) 35 min    Activity Tolerance Patient tolerated treatment well;Patient limited by pain    Behavior During Therapy [facility] for tasks assessed/performed           Past Medical History:  Diagnosis Date  . DVT (deep venous thrombosis) (HCC)    right leg in 2011; prior injury to right foot  . HLD (hyperlipidemia)   . Right foot injury     History reviewed. No pertinent surgical history.  There were no vitals filed for this visit.                      [facility] Adult PT Treatment/Exercise - 03/18/21 0001      Exercises   Exercises Neck;Shoulder      Neck Exercises: Supine   Cervical Isometrics Extension;Flexion;Right lateral flexion;Left lateral flexion;Right rotation;Left rotation;5 secs;10 reps    Cervical Isometrics Limitations SEATED      Shoulder Exercises: Supine   Protraction AROM;Left;10 reps    Protraction Limitations pain    Horizontal ABduction Strengthening;Both;10 reps;Theraband    Theraband Level (Shoulder Horizontal ABduction) Level 3 (Green)    Horizontal ABduction Limitations modified range for painfree range    External Rotation Strengthening;Both;20 reps;Theraband    Theraband  Level (Shoulder External Rotation) Level 3 (Green)    Flexion AROM;10 reps    Flexion Limitations to 90, pain    Other Supine Exercises draw the sword diag green x 20 Lt while stabilzing Rt      Shoulder Exercises: ROM/Strengthening   UBE (Upper Arm Bike) L1 3x3    Rhythmic Stabilization, Supine circles 3# Rt UE 2x10 each way      Shoulder Exercises: Power [employer] 10 reps    Row Limitations bil 30lb, pain today    External Rotation 10 reps    External Rotation Limitations 15lb, Rt, pain today                    PT Short Term Goals - 02/25/21 1250      PT SHORT TERM GOAL #1   Title Pt ind with initial HEP    Status Achieved      PT SHORT TERM GOAL #2   Title Pt will be able to demo improved ability to correct forward head posture with min pain    Status Achieved      PT SHORT TERM GOAL #3   Title Pt will demo improved cervical ROM to at least 35 flexion, 25 ext, 50 bil Rot with min pain.    Status Achieved  PT Long Term Goals - 02/25/21 1250      PT LONG TERM GOAL #1   Title Pt will achieve at least 4/5 strength in Rt UE at shoulder and elbow and at least 40lb grip strength with min pain on testing for improved funcitonal use with ADLs.    Baseline met for grip (70lb Rt UE), 4-/5 abd, ER, elbow flexion    Status On-going      PT LONG TERM GOAL #2   Title Pt will report at least 50% improved sleep disruption secondary to improved pain with bed positioning and bed mobility.    Baseline no progress    Status On-going      PT LONG TERM GOAL #3   Title Pt will report reduced pain by at least 50% with ADLs throughout the day.    Baseline ongoing high levels of pain    Status On-going      PT LONG TERM GOAL #4   Title Pt will be able to perform overhead 3lb transfer x 5 reps with Rt UE for improved functional use of Rt UE.    Status New                 Plan - 03/18/21 1128    Clinical Impression Statement Pt had 3/4 pain on arrival  and reports pain worsens as the day goes on with activity.  Can reach 6-7/10.  He had increased pain with Rt shoulder ther ex today as compared to last week so was more limited on weights/resistance/reps.  He has weakness in Rt sided cervical stabilizers with isometrics.  He will push through pain so difficulty to tell how well tolerated ther ex is.  He continues to use TENS at home for relief.  Continue along POC with ongoing assessment.    Rehab Potential Fair    PT Frequency 2x / week    PT Duration 8 weeks    PT Treatment/Interventions ADLs/Self Care Home Management;Electrical Stimulation;Cryotherapy;Moist Heat;Joint Manipulations;Spinal Manipulations;Taping;Passive range of motion;Dry needling;Manual techniques;Patient/family education;Functional mobility training;Therapeutic activities;Therapeutic exercise;Neuromuscular re-education;Iontophoresis 4mg /ml Dexamethasone;Traction    PT Next Visit Plan cervical isometrics with Rt sided focus for stab/strength, shoulder stabilization, strength as tol, UBE    PT Home Exercise Plan Access Code: [email or web address], DN info    Consulted and Agree with Plan of Care Patient           Patient will benefit from skilled therapeutic intervention in order to improve the following deficits and impairments:     Visit Diagnosis: Radiculopathy, cervical region  Muscle weakness (generalized)  Cramp and spasm     Problem List Patient Active Problem List   Diagnosis Date Noted  . Degenerative cervical disc 01/16/2021  . Right shoulder pain 01/16/2021  . Degenerative arthritis of knee 05/28/2020  . 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 Leg pain, right 10/03/2012  . Leg swelling 10/03/2012  . Right leg DVT ([place]) 10/03/2012  . HLD (hyperlipidemia) 07/25/2012  . Acute thromboembolism of deep veins of lower extremity (HCC) 12/24/2010    Kevia Zaucha, PT 03/18/21 11:43 AM   [facility] [address], [place], [place], [address] Phone: [phone]   Fax:  [phone]  Name: Andrew [name] MRN: [id number] Date of Birth: Mar 07, 1951

## 2021-03-20 ENCOUNTER — Ambulatory Visit: Payer: Medicare Other | Admitting: Physical Therapy

## 2021-03-23 ENCOUNTER — Encounter: Payer: Self-pay | Admitting: Physical Therapy

## 2021-03-23 ENCOUNTER — Ambulatory Visit: Payer: Medicare Other | Admitting: Physical Therapy

## 2021-03-23 ENCOUNTER — Other Ambulatory Visit: Payer: Self-pay

## 2021-03-23 DIAGNOSIS — R252 Cramp and spasm: Secondary | ICD-10-CM

## 2021-03-23 DIAGNOSIS — M6281 Muscle weakness (generalized): Secondary | ICD-10-CM

## 2021-03-23 DIAGNOSIS — M542 Cervicalgia: Secondary | ICD-10-CM

## 2021-03-23 DIAGNOSIS — R293 Abnormal posture: Secondary | ICD-10-CM

## 2021-03-23 DIAGNOSIS — M5412 Radiculopathy, cervical region: Secondary | ICD-10-CM | POA: Diagnosis not present

## 2021-03-23 NOTE — Therapy (Addendum)
[facility] [address], [address] [place], [place], [address] Phone: [phone]   Fax:  [phone]  Physical Therapy Treatment  Patient Details  Name: Andrew [name] MRN: [id number] Date of Birth: September 18, 1951 Referring Provider (PT): Judi Saa, DO   Progress Note Reporting Period 02/03/2021 to 03/23/2021  See note below for Objective Data and Assessment of Progress/Goals.     Encounter Date: 03/23/2021   PT End of Session - 03/23/21 1105    Visit Number 10    Number of Visits 12    Date for PT Re-Evaluation 03/31/21    Authorization Type Medicaid [employer] - 12 visits approved    Authorization Time Period 02/03/21 - 04/04/21    Authorization - Visit Number 10    Authorization - Number of Visits 12    Progress Note Due on Visit 10    PT Start Time 1016    PT Stop Time 1100    PT Time Calculation (min) 44 min    Activity Tolerance Patient tolerated treatment well;No increased pain    Behavior During Therapy [facility] for tasks assessed/performed           Past Medical History:  Diagnosis Date  . DVT (deep venous thrombosis) (HCC)    right leg in 2011; prior injury to right foot  . HLD (hyperlipidemia)   . Right foot injury     No past surgical history on file.  There were no vitals filed for this visit.   Subjective Assessment - 03/23/21 1019    Subjective Reports continues difficulty sleeping. Slept approx. 4 hours last night.    Patient is accompained by: Family member    Pertinent History per Dr Otelia Sergeant: don't lift > 5lb, no overhead use of UEs, try TENS, cervical traction, McKenzie neck exercises    Limitations Other (comment);Sitting    How long can you stand comfortably? constant pain    How long can you walk comfortably? constant pain    Diagnostic tests cervical xray 01/16/21, MRI 01/28/21: multi-level disc degen worst at C6/7 with mod Rt foraminal stenosis and mild spinal stenosis    Patient Stated Goals  sleep, reduce pain    Currently in Pain? Yes    Pain Score 5     Pain Location Neck    Pain Orientation Right    Pain Descriptors / Indicators Sharp;Sore    Pain Onset More than a month ago    Pain Frequency Constant              [facility] PT Assessment - 03/23/21 0001      Assessment   Medical Diagnosis M54.12 (ICD-10-CM) - Cervical radiculopathy    Referring Provider (PT) Judi Saa, DO    Onset Date/Surgical Date --   3 mos   Hand Dominance Right    Prior Therapy no      Precautions   Precautions None      Restrictions   Weight Bearing Restrictions No      Balance Screen   Has the patient fallen in the past 6 months No      Home Environment   Living Environment Private residence    Living Arrangements Other relatives    Type of Home House    Home Layout Multi-level;Bed/bath upstairs    Home Equipment None      Prior Function   Level of Independence Independent    Vocation Retired      IT consultant   Overall Cognitive Status  Within Functional Limits for tasks assessed      Posture/Postural Control   Posture/Postural Control Postural limitations    Postural Limitations Forward head;Rounded Shoulders      ROM / Strength   AROM / PROM / Strength AROM;Strength      AROM   Cervical Flexion 31    Cervical Extension 35    Cervical - Right Rotation 30    Cervical - Left Rotation 40      Strength   Right Shoulder Flexion 4/5    Right Shoulder ABduction 4-/5    Right Shoulder External Rotation 4/5    Right Elbow Flexion 4-/5    Right Elbow Extension 4-/5                         [facility] Adult PT Treatment/Exercise - 03/23/21 0001      Neck Exercises: Supine   Cervical Isometrics Extension;Flexion;Right lateral flexion;Left lateral flexion;Right rotation;Left rotation;5 secs;10 reps    Cervical Isometrics Limitations SEATED      Shoulder Exercises: Supine   Protraction Both;10 reps    Horizontal ABduction Strengthening;Both;Theraband;12  reps    Theraband Level (Shoulder Horizontal ABduction) Level 3 (Green)    Horizontal ABduction Limitations modified range for painfree range    External Rotation Strengthening;Both;20 reps;Theraband    Theraband Level (Shoulder External Rotation) Level 3 (Green)    Flexion AROM;10 reps    Flexion Limitations to 90, pain    Other Supine Exercises draw the sword diag green x 20 Lt while stabilzing Rt   x10 yellow tband; Lt stabilizing Rt     Shoulder Exercises: ROM/Strengthening   UBE (Upper Arm Bike) L1 3x3      Shoulder Exercises: Power [employer] 10 reps    Row Limitations VC and TC for scapular depression with retraction    External Rotation 10 reps    External Rotation Limitations 15lb, Rt, no increase in pain      Modalities   Modalities Moist Heat      Moist Heat Therapy   Number Minutes Moist Heat 8 Minutes   at end of session   Moist Heat Location Cervical                  PT Education - 03/23/21 1127    Education Details progressing POC and continuing with therapy which patient and family in agreement    Person(s) Educated Patient;Other (comment)   Patient's brother   Methods Explanation    Comprehension Verbalized understanding            PT Short Term Goals - 03/23/21 1029      PT SHORT TERM GOAL #1   Title Pt ind with initial HEP    Time 2    Period Weeks    Status Achieved   Previously met   Target Date 02/17/21      PT SHORT TERM GOAL #2   Title Pt will be able to demo improved ability to correct forward head posture with min pain    Time 4    Period Weeks    Status Achieved   Able to correct with mildly increased pain   Target Date 03/03/21      PT SHORT TERM GOAL #3   Title Pt will demo improved cervical ROM to at least 35 flexion, 25 ext, 50 bil Rot with min pain.    Time 4    Period Weeks    Status  Achieved   AROM decreased this date but previously met   Target Date 03/03/21             PT Long Term Goals - 03/23/21 1030       PT LONG TERM GOAL #1   Title Pt will achieve at least 4/5 strength in Rt UE at shoulder and elbow and at least 40lb grip strength with min pain on testing for improved funcitonal use with ADLs.    Baseline met for grip (70lb Rt UE), 4-/5 abd, ER, elbow flexion    Time 8    Period Weeks    Status Partially Met   met for ER; abd and elbow flex/ext 4-/5     PT LONG TERM GOAL #2   Title Pt will report at least 50% improved sleep disruption secondary to improved pain with bed positioning and bed mobility.    Baseline no progress    Time 8    Period Weeks    Status On-going   Only sleeping 4 hours at a time     PT LONG TERM GOAL #3   Title Pt will report reduced pain by at least 50% with ADLs throughout the day.    Baseline ongoing high levels of pain    Time 8    Period Weeks    Status On-going   Pain consistently 4-5/10     PT LONG TERM GOAL #4   Title Pt will be able to perform overhead 3lb transfer x 5 reps with Rt UE for improved functional use of Rt UE.    Time 8    Period Weeks    Status Not Met   Patient only able to perform overhead activity using Lt UE                Plan - 03/23/21 1107    Clinical Impression Statement Patient is a 70 y/o male refered due to cervical radiculopathy. Patient reports 4-5/10 pain upon arrival to session. Reaching 6/10 during session but decreased with increased recovery period between exercises. Patient demos improved Rt shoulder ER improved to 4/5. Cervical AROM limited this date due to increased pain however, patient has consistently demonstrate improved AROM with regards to flexion, extension, and bilateral lateral flexion. Pain greatly improved as patient reports pain 4-5/10 as compared to 8/10 at start of POC. However, deficits continue to persist as Rt UE strength continues to be outside of functional limits for overhead ADLs. Additionally patient continues to exhibit sleep hygiene impairments as he continues to be unable to sleep  through the night due to increased pain. Would benefit from continued skilled intervention to address impairments for decreased pain to improve sleep hygiene and more readily perform ADLs.    Personal Factors and Comorbidities Age    Examination-Activity Limitations Transfers;Sit;Sleep;Lift;Carry;Bed Mobility;Reach Overhead;Bathing;Hygiene/Grooming    Examination-Participation Restrictions Meal Prep;Cleaning;Shop    Stability/Clinical Decision Making Evolving/Moderate complexity    Clinical Decision Making Moderate    Rehab Potential Fair    PT Frequency 2x / week    PT Duration 8 weeks    PT Treatment/Interventions ADLs/Self Care Home Management;Electrical Stimulation;Cryotherapy;Moist Heat;Joint Manipulations;Spinal Manipulations;Taping;Passive range of motion;Dry needling;Manual techniques;Patient/family education;Functional mobility training;Therapeutic activities;Therapeutic exercise;Neuromuscular re-education;Iontophoresis 4mg /ml Dexamethasone;Traction    PT Next Visit Plan continue cervical isometrics and shoulder stablization with progression to patient tolerance; continue UBE; modalities as needed    PT Home Exercise Plan Access Code: [email or web address]    Consulted and Agree with Plan of Care Patient  Patient will benefit from skilled therapeutic intervention in order to improve the following deficits and impairments:  Decreased range of motion,Impaired tone,Increased muscle spasms,Pain,Impaired UE functional use,Decreased activity tolerance,Decreased mobility,Decreased strength,Postural dysfunction,Hypomobility  Visit Diagnosis: Abnormal posture  Cervicalgia  Cramp and spasm  Muscle weakness (generalized)     Problem List Patient Active Problem List   Diagnosis Date Noted  . Degenerative cervical disc 01/16/2021  . Right shoulder pain 01/16/2021  . Degenerative arthritis of knee 05/28/2020  . 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  Leg pain, right 10/03/2012  . Leg swelling 10/03/2012  . Right leg DVT ([place]) 10/03/2012  . HLD (hyperlipidemia) 07/25/2012  . Acute thromboembolism of deep veins of lower extremity (HCC) 12/24/2010   Everardo All PT, DPT  03/23/21 11:37 AM   [facility] [address], [place], [place], [address] Phone: [phone]   Fax:  [phone]  Name: Andrew [name] MRN: [id number] Date of Birth: 12-Mar-1951

## 2021-03-25 ENCOUNTER — Encounter: Payer: Self-pay | Admitting: Physical Therapy

## 2021-03-25 ENCOUNTER — Other Ambulatory Visit: Payer: Self-pay

## 2021-03-25 ENCOUNTER — Encounter: Payer: Medicaid Other | Admitting: Physical Therapy

## 2021-03-25 ENCOUNTER — Ambulatory Visit: Payer: Medicare Other | Admitting: Physical Therapy

## 2021-03-25 DIAGNOSIS — M5412 Radiculopathy, cervical region: Secondary | ICD-10-CM | POA: Diagnosis not present

## 2021-03-25 DIAGNOSIS — R293 Abnormal posture: Secondary | ICD-10-CM

## 2021-03-25 DIAGNOSIS — M542 Cervicalgia: Secondary | ICD-10-CM

## 2021-03-25 DIAGNOSIS — M6281 Muscle weakness (generalized): Secondary | ICD-10-CM

## 2021-03-25 DIAGNOSIS — R252 Cramp and spasm: Secondary | ICD-10-CM

## 2021-03-25 NOTE — Therapy (Signed)
[facility] [address] [place], [place], [address] Phone: [phone]   Fax:  [phone]  Physical Therapy Treatment  Patient Details  Name: Andrew [name] MRN: [id number] Date of Birth: 01/18/1951 Referring Provider (PT): Lyndal Pulley, DO   Encounter Date: 03/25/2021   PT End of Session - 03/25/21 1057    Visit Number 11    Date for PT Re-Evaluation 03/31/21    Authorization Type Medicaid [employer] - 12 visits approved    Authorization Time Period 02/03/21 - 04/04/21    Authorization - Visit Number 11    Authorization - Number of Visits 12    Progress Note Due on Visit 10    PT Start Time 3532    PT Stop Time 1100    PT Time Calculation (min) 45 min    Activity Tolerance Patient tolerated treatment well;No increased pain    Behavior During Therapy WFL for tasks assessed/performed           Past Medical History:  Diagnosis Date  . DVT (deep venous thrombosis) (HCC)    right leg in 2011; prior injury to right foot  . HLD (hyperlipidemia)   . Right foot injury     History reviewed. No pertinent surgical history.  There were no vitals filed for this visit.   Subjective Assessment - 03/25/21 1022    Subjective Pain intensity has decreased pain from 7-8/10 to 4-5/10.    Pertinent History per Dr Louanne Skye: don't lift > 5lb, no overhead use of UEs, try TENS, cervical traction, McKenzie neck exercises    Limitations Other (comment);Sitting    How long can you sit comfortably? constant pain    How long can you stand comfortably? constant pain    How long can you walk comfortably? constant pain    Diagnostic tests cervical xray 01/16/21, MRI 01/28/21: multi-level disc degen worst at C6/7 with mod Rt foraminal stenosis and mild spinal stenosis    Patient Stated Goals sleep, reduce pain    Currently in Pain? Yes    Pain Score 5     Pain Location Neck    Pain Orientation Right    Pain Descriptors / Indicators  Sharp;Sore    Pain Type Acute pain    Pain Radiating Towards bilateral elbows/right shoulder    Pain Onset More than a month ago    Pain Frequency Constant    Aggravating Factors  sleep, sit, ADL's    Pain Relieving Factors TENS, HEP, shower    Effect of Pain on Daily Activities sleep, ADL's                             [facility] Adult PT Treatment/Exercise - 03/25/21 0001      Neck Exercises: Supine   Cervical Isometrics Extension;Flexion;Right lateral flexion;Left lateral flexion;Right rotation;Left rotation;5 secs;5 reps    Cervical Isometrics Limitations SEATED      Shoulder Exercises: Supine   External Rotation AAROM;Strengthening;15 reps    External Rotation Limitations monitoring for pain and tightness at endrange    Flexion AAROM;Strengthening;Right;10 reps    Flexion Limitations working on righ tglenohumeral movement and relaxation of the right upper trap and scapular movement    ABduction AAROM;Strengthening;15 reps    ABduction Limitations assisting the scapula movement and monitoring for pain      Shoulder Exercises: Standing   Flexion Strengthening;Both;10 reps    Flexion Limitations forearms on the wall and therapist  keeping the right upper trap relaxed      Modalities   Modalities Moist Heat      Moist Heat Therapy   Number Minutes Moist Heat 10 Minutes    Moist Heat Location --   right cervical and shoulder in supine     Manual Therapy   Manual Therapy Soft tissue mobilization;Joint mobilization    Joint Mobilization right shoulder joint mobilization to inferior glide, distraction, posterior and anterior glide grade 3; right scapular mobilization to right    Soft tissue mobilization manual mobilization to the right cervical paraspinals, upper trap, rhomboids, right pectoralis, right serratus ant. and subscapularis, right RTC                    PT Short Term Goals - 03/23/21 1029      PT SHORT TERM GOAL #1   Title Pt ind with initial  HEP    Time 2    Period Weeks    Status Achieved   Previously met   Target Date 02/17/21      PT SHORT TERM GOAL #2   Title Pt will be able to demo improved ability to correct forward head posture with min pain    Time 4    Period Weeks    Status Achieved   Able to correct with mildly increased pain   Target Date 03/03/21      PT SHORT TERM GOAL #3   Title Pt will demo improved cervical ROM to at least 35 flexion, 25 ext, 50 bil Rot with min pain.    Time 4    Period Weeks    Status Achieved   AROM decreased this date but previously met   Target Date 03/03/21             PT Long Term Goals - 03/23/21 1030      PT LONG TERM GOAL #1   Title Pt will achieve at least 4/5 strength in Rt UE at shoulder and elbow and at least 40lb grip strength with min pain on testing for improved funcitonal use with ADLs.    Baseline met for grip (70lb Rt UE), 4-/5 abd, ER, elbow flexion    Time 8    Period Weeks    Status Partially Met   met for ER; abd and elbow flex/ext 4-/5     PT LONG TERM GOAL #2   Title Pt will report at least 50% improved sleep disruption secondary to improved pain with bed positioning and bed mobility.    Baseline no progress    Time 8    Period Weeks    Status On-going   Only sleeping 4 hours at a time     PT LONG TERM GOAL #3   Title Pt will report reduced pain by at least 50% with ADLs throughout the day.    Baseline ongoing high levels of pain    Time 8    Period Weeks    Status On-going   Pain consistently 4-5/10     PT LONG TERM GOAL #4   Title Pt will be able to perform overhead 3lb transfer x 5 reps with Rt UE for improved functional use of Rt UE.    Time 8    Period Weeks    Status Not Met   Patient only able to perform overhead activity using [employer] - 03/25/21 1058  Clinical Impression Statement Patient has trigger points in the right serratus anterior, subscapularis, cervical paraspinals, right pectorlis and along the  scapula. End of treatment patient was able to move his right shoulder past 90 degrees with less pain and improved right scapular movement. Patient will tighten the right upper trap with shoulder flexion on the wall. Patient reports his pain level is mover 5/10 instead of 7/10. Patient would benefit from skilled therapy to address impairments for decreased pain to improve sleep hygiene and more readily perform ADL's.    Personal Factors and Comorbidities Age    Examination-Activity Limitations Transfers;Sit;Sleep;Lift;Carry;Bed Mobility;Reach Overhead;Bathing;Hygiene/Grooming    Examination-Participation Restrictions Meal Prep;Cleaning;Shop    Stability/Clinical Decision Making Evolving/Moderate complexity    Rehab Potential Fair    PT Frequency 2x / week    PT Duration 8 weeks    PT Treatment/Interventions ADLs/Self Care Home Management;Electrical Stimulation;Cryotherapy;Moist Heat;Joint Manipulations;Spinal Manipulations;Taping;Passive range of motion;Dry needling;Manual techniques;Patient/family education;Functional mobility training;Therapeutic activities;Therapeutic exercise;Neuromuscular re-education;Iontophoresis 4mg /ml Dexamethasone;Traction    PT Next Visit Plan work on right shoulder ROM and mobilization, work on scapula stabilization with cerivcal neutral, possible dry needling to cervical,interscapular, pectoralis and subscapularis    PT Home Exercise Plan Access Code: [email or web address]    Consulted and Agree with Plan of Care Patient           Patient will benefit from skilled therapeutic intervention in order to improve the following deficits and impairments:  Decreased range of motion,Impaired tone,Increased muscle spasms,Pain,Impaired UE functional use,Decreased activity tolerance,Decreased mobility,Decreased strength,Postural dysfunction,Hypomobility  Visit Diagnosis: Abnormal posture  Cervicalgia  Cramp and spasm  Muscle weakness (generalized)  Radiculopathy, cervical  region     Problem List Patient Active Problem List   Diagnosis Date Noted  . Degenerative cervical disc 01/16/2021  . Right shoulder pain 01/16/2021  . Degenerative arthritis of knee 05/28/2020  . 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 Leg pain, right 10/03/2012  . Leg swelling 10/03/2012  . Right leg DVT ([place]) 10/03/2012  . HLD (hyperlipidemia) 07/25/2012  . Acute thromboembolism of deep veins of lower extremity (HCC) 12/24/2010    Earlie Counts, PT 03/25/21 11:02 AM   [facility] [address], [place], [place], [address] Phone: [phone]   Fax:  [phone]  Name: Andrew [name] MRN: [id number] Date of Birth: 12-Aug-1951

## 2021-03-26 ENCOUNTER — Other Ambulatory Visit: Payer: Self-pay | Admitting: Specialist

## 2021-03-26 NOTE — Telephone Encounter (Signed)
Please advise thanks.

## 2021-03-31 ENCOUNTER — Other Ambulatory Visit: Payer: Self-pay

## 2021-03-31 ENCOUNTER — Encounter: Payer: Self-pay | Admitting: Physical Therapy

## 2021-03-31 ENCOUNTER — Ambulatory Visit: Payer: Medicare Other | Admitting: Physical Therapy

## 2021-03-31 DIAGNOSIS — M6281 Muscle weakness (generalized): Secondary | ICD-10-CM

## 2021-03-31 DIAGNOSIS — R252 Cramp and spasm: Secondary | ICD-10-CM

## 2021-03-31 DIAGNOSIS — M5412 Radiculopathy, cervical region: Secondary | ICD-10-CM

## 2021-03-31 DIAGNOSIS — R293 Abnormal posture: Secondary | ICD-10-CM

## 2021-03-31 DIAGNOSIS — M542 Cervicalgia: Secondary | ICD-10-CM

## 2021-03-31 NOTE — Therapy (Signed)
[facility] [address], [place], [place], [address] Phone: [phone]   Fax:  [phone]  Physical Therapy Treatment  Patient Details  Name: Andrew [name] MRN: [id number] Date of Birth: September 07, 1951 Referring Provider (PT): Lyndal Pulley, DO   Encounter Date: 03/31/2021   PT End of Session - 03/31/21 1158    Visit Number 12    Number of Visits 12    Date for PT Re-Evaluation 03/31/21    Authorization Type requesting more visits    Authorization - Visit Number 12    Authorization - Number of Visits 12    Progress Note Due on Visit 20    PT Start Time 1150    PT Stop Time 1230    PT Time Calculation (min) 40 min    Activity Tolerance Patient tolerated treatment well;No increased pain    Behavior During Therapy WFL for tasks assessed/performed           Past Medical History:  Diagnosis Date  . DVT (deep venous thrombosis) (HCC)    right leg in 2011; prior injury to right foot  . HLD (hyperlipidemia)   . Right foot injury     History reviewed. No pertinent surgical history.  There were no vitals filed for this visit.   Subjective Assessment - 03/31/21 1153    Subjective Pain pattern is worse at day progresses.  0/10 in AM, reaches 4-5/10 mid-afternoon, reaches 7/10 at night.  Pain pattern used to be 7-8/10 24/7 so this is improvement.  Sleep is still signif disruption by pain, sleeps maybe 5 hours.    Pertinent History per Dr Louanne Skye: don't lift > 5lb, no overhead use of UEs, try TENS, cervical traction, McKenzie neck exercises    Limitations Other (comment);Sitting    How long can you sit comfortably? constant pain    How long can you stand comfortably? constant pain    How long can you walk comfortably? constant pain    Diagnostic tests cervical xray 01/16/21, MRI 01/28/21: multi-level disc degen worst at C6/7 with mod Rt foraminal stenosis and mild spinal stenosis    Patient Stated Goals sleep, reduce  pain    Currently in Pain? Yes    Pain Score 5     Pain Location Neck    Pain Orientation Right    Pain Descriptors / Indicators Sharp;Sore    Pain Type Acute pain    Pain Onset More than a month ago    Pain Frequency Constant    Aggravating Factors  sleep, sit, ADLs    Pain Relieving Factors TENS, HEP, shower    Effect of Pain on Daily Activities sleep, ADLs              [facility] PT Assessment - 03/31/21 0001      Assessment   Medical Diagnosis M54.12 (ICD-10-CM) - Cervical radiculopathy    Referring Provider (PT) Lyndal Pulley, DO    Onset Date/Surgical Date --   3 mos   Hand Dominance Right    Prior Therapy no      ROM / Strength   AROM / PROM / Strength AROM;Strength      AROM   Right Shoulder Flexion 140 Degrees   pain, firm end feel   Right Shoulder ABduction 155 Degrees    Cervical Flexion 35    Cervical Extension 30    Cervical - Right Rotation 30   45 deg end of session   Cervical - Left  Rotation 50   70 deg end of session     Strength   Overall Strength Comments 40lb Rt grip, 65lb Lt grip    Right Shoulder Flexion 4-/5    Right Shoulder Extension 4/5    Right Shoulder ABduction 4-/5    Right Shoulder Internal Rotation 4/5    Right Shoulder External Rotation 4-/5                         [facility] Adult PT Treatment/Exercise - 03/31/21 0001      Manual Therapy   Manual Therapy Soft tissue mobilization;Joint mobilization;Manual Traction;Passive ROM    Joint Mobilization upper t-spine facet mobs for rotation bil    Soft tissue mobilization subscap, posterior RC, pectorals, serratus anterior    Passive ROM bil cervical rotation after mobs and manual traction, Rt GH flexion    Manual Traction Gr III mid and lower cervical in neutral and slight flexion                    PT Short Term Goals - 03/23/21 1029      PT SHORT TERM GOAL #1   Title Pt ind with initial HEP    Time 2    Period Weeks    Status Achieved   Previously met    Target Date 02/17/21      PT SHORT TERM GOAL #2   Title Pt will be able to demo improved ability to correct forward head posture with min pain    Time 4    Period Weeks    Status Achieved   Able to correct with mildly increased pain   Target Date 03/03/21      PT SHORT TERM GOAL #3   Title Pt will demo improved cervical ROM to at least 35 flexion, 25 ext, 50 bil Rot with min pain.    Time 4    Period Weeks    Status Achieved   AROM decreased this date but previously met   Target Date 03/03/21             PT Long Term Goals - 03/31/21 1159      PT LONG TERM GOAL #1   Title Pt will achieve at least 4/5 strength in Rt UE at shoulder and elbow and at least 40lb grip strength with min pain on testing for improved funcitonal use with ADLs.    Baseline grip 40lb Rt, Lt 65lb, 4-/5 abd, ER, flexion, 4/5 all other motions with pain    Status On-going      PT LONG TERM GOAL #2   Title Pt will report at least 50% improved sleep disruption secondary to improved pain with bed positioning and bed mobility.    Baseline sleeps 5 hours a night, improved night pain by 50%    Status Achieved      PT LONG TERM GOAL #3   Title Pt will report reduced pain by at least 50% with ADLs throughout the day.    Baseline 50% improved    Status Achieved      PT LONG TERM GOAL #4   Title Pt will be able to perform overhead 3lb transfer x 5 reps with Rt UE for improved functional use of Rt UE.    Baseline working on Restaurant manager, fast food                 Plan - 03/31/21 1317  Clinical Impression Statement Pt has met all STGs and some LTGs.  He reports approx 50% reduction in pain at night and with ADLs.  He is able to sleep approx 5 hours, limited by pain.  He reports pain that worsens throughout the day, with pain reaching 7-8/10 at night but resetting to 2/10 in AM.  He continues to have limited and painful cervical and Rt shoulder A/ROM.  He has weakness in c-spine, Rt shoulder, elbow  and grip strength with varies somewhat depending on pain level/inhibition.  Strength of Rt grip today was 40lb compared to previous 70lb.  Rt cervical isometric strength is 4/5 and Rt shoulder strength ranges from 4-/5 to 4/5 with pain on testing.  Pt has signif substitution with Rt UE use secondary to pain and weakness which has contributed to trigger points present in Rt serratus anterior, subscap, upper trap, right pectoralis and posterior GH shoulder muscles.  With manual therapy today Pt's Rt shoulder elevation and bil cervical rotation improved.  Pt experiences signif pain with manual therapy and today noted Rt finger numbness with manual work in axillary region.  He is using TENS machine at home and is open to revisiting mechanical traction, but not more DN at this time due to signif pain with this in the past.  Pt will benefit from continued PT for manual techniques, stabilization, strength, and ROM to reduce pain and improve QOL and functional use of dominant Rt UE.    Personal Factors and Comorbidities Age;Time since onset of injury/illness/exacerbation    Examination-Activity Limitations Transfers;Sit;Sleep;Lift;Carry;Bed Mobility;Reach Overhead;Bathing;Hygiene/Grooming    Examination-Participation Restrictions Meal Prep;Cleaning;Shop    Stability/Clinical Decision Making Evolving/Moderate complexity    Rehab Potential Fair    PT Frequency 2x / week    PT Duration 8 weeks    PT Treatment/Interventions ADLs/Self Care Home Management;Electrical Stimulation;Cryotherapy;Moist Heat;Joint Manipulations;Spinal Manipulations;Taping;Passive range of motion;Dry needling;Manual techniques;Patient/family education;Functional mobility training;Therapeutic activities;Therapeutic exercise;Neuromuscular re-education;Iontophoresis 4mg /ml Dexamethasone;Traction    PT Next Visit Plan work on right shoulder ROM and mobilization, work on scapula stabilization with cerivcal neutral    PT Home Exercise Plan Access  Code: [email or web address]    Consulted and Agree with Plan of Care Patient           Patient will benefit from skilled therapeutic intervention in order to improve the following deficits and impairments:  Decreased range of motion,Impaired tone,Increased muscle spasms,Pain,Impaired UE functional use,Decreased activity tolerance,Decreased mobility,Decreased strength,Postural dysfunction,Hypomobility  Visit Diagnosis: Abnormal posture - Plan: PT plan of care cert/re-cert  Cervicalgia - Plan: PT plan of care cert/re-cert  Cramp and spasm - Plan: PT plan of care cert/re-cert  Muscle weakness (generalized) - Plan: PT plan of care cert/re-cert  Radiculopathy, cervical region - Plan: PT plan of care cert/re-cert     Problem List Patient Active Problem List   Diagnosis Date Noted  . Degenerative cervical disc 01/16/2021  . Right shoulder pain 01/16/2021  . Degenerative arthritis of knee 05/28/2020  . 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 Leg pain, right 10/03/2012  . Leg swelling 10/03/2012  . Right leg DVT ([place]) 10/03/2012  . HLD (hyperlipidemia) 07/25/2012  . Acute thromboembolism of deep veins of lower extremity (HCC) 12/24/2010    Larry Alcock, PT 03/31/21 1:28 PM   [facility] [address], [place], [place], [address] Phone: [phone]   Fax:  [phone]  Name: Olson Lucarelli MRN: [id number] Date of Birth: 01-Oct-1951

## 2021-04-08 ENCOUNTER — Other Ambulatory Visit: Payer: Self-pay

## 2021-04-08 ENCOUNTER — Ambulatory Visit: Payer: Medicare Other | Admitting: Physical Therapy

## 2021-04-08 ENCOUNTER — Encounter: Payer: Self-pay | Admitting: Physical Therapy

## 2021-04-08 DIAGNOSIS — M6281 Muscle weakness (generalized): Secondary | ICD-10-CM

## 2021-04-08 DIAGNOSIS — M5412 Radiculopathy, cervical region: Secondary | ICD-10-CM | POA: Diagnosis not present

## 2021-04-08 DIAGNOSIS — R252 Cramp and spasm: Secondary | ICD-10-CM

## 2021-04-08 DIAGNOSIS — R293 Abnormal posture: Secondary | ICD-10-CM

## 2021-04-08 DIAGNOSIS — M542 Cervicalgia: Secondary | ICD-10-CM

## 2021-04-08 NOTE — Therapy (Signed)
[facility] [address] [place], [place], [address] Phone: [phone]   Fax:  [phone]  Physical Therapy Treatment  Patient Details  Name: Andrew [name] MRN: [id number] Date of Birth: Mar 11, 1951 Referring Provider (PT): Lyndal Pulley, DO   Encounter Date: 04/08/2021   PT End of Session - 04/08/21 1721    Visit Number 13    Date for PT Re-Evaluation 03/31/21    Authorization Type 5 visits from 4/19    Authorization - Visit Number 1    Authorization - Number of Visits 5    Progress Note Due on Visit 20    PT Start Time 9450    PT Stop Time 1600    PT Time Calculation (min) 30 min    Activity Tolerance Patient tolerated treatment well;No increased pain    Behavior During Therapy WFL for tasks assessed/performed           Past Medical History:  Diagnosis Date  . DVT (deep venous thrombosis) (HCC)    right leg in 2011; prior injury to right foot  . HLD (hyperlipidemia)   . Right foot injury     History reviewed. No pertinent surgical history.  There were no vitals filed for this visit.   Subjective Assessment - 04/08/21 1718    Subjective Had significantly increased pain yesterday. Pain continues to increase as day progresses and is worse at night.    Patient is accompained by: Family member    Pertinent History per Dr Louanne Skye: don't lift > 5lb, no overhead use of UEs, try TENS, cervical traction, McKenzie neck exercises    Limitations Other (comment);Sitting    How long can you sit comfortably? constant pain    How long can you stand comfortably? constant pain    How long can you walk comfortably? constant pain    Diagnostic tests cervical xray 01/16/21, MRI 01/28/21: multi-level disc degen worst at C6/7 with mod Rt foraminal stenosis and mild spinal stenosis    Patient Stated Goals sleep, reduce pain    Currently in Pain? Yes    Pain Score 6     Pain Location Neck    Pain Orientation Right    Pain  Descriptors / Indicators Sore;Sharp    Pain Type Acute pain;Chronic pain    Pain Onset More than a month ago    Pain Frequency Constant                             [facility] Adult PT Treatment/Exercise - 04/08/21 0001      Manual Therapy   Manual Therapy Soft tissue mobilization;Joint mobilization    Joint Mobilization upper t-spine facet mobs for rotation bil    Soft tissue mobilization bilateral cervical paraspinals, thoracic paraspinals, upper trap, levator scap, rhomboids, muscles of rotator cuff    Manual Traction upper cervical                    PT Short Term Goals - 03/23/21 1029      PT SHORT TERM GOAL #1   Title Pt ind with initial HEP    Time 2    Period Weeks    Status Achieved   Previously met   Target Date 02/17/21      PT SHORT TERM GOAL #2   Title Pt will be able to demo improved ability to correct forward head posture with min pain    Time  4    Period Weeks    Status Achieved   Able to correct with mildly increased pain   Target Date 03/03/21      PT SHORT TERM GOAL #3   Title Pt will demo improved cervical ROM to at least 35 flexion, 25 ext, 50 bil Rot with min pain.    Time 4    Period Weeks    Status Achieved   AROM decreased this date but previously met   Target Date 03/03/21             PT Long Term Goals - 04/08/21 1721      PT LONG TERM GOAL #1   Title Pt will achieve at least 4/5 strength in Rt UE at shoulder and elbow and at least 40lb grip strength with min pain on testing for improved funcitonal use with ADLs.    Baseline grip 40lb Rt, Lt 65lb, 4-/5 abd, ER, flexion, 4/5 all other motions with pain    Time 8    Period Weeks    Status On-going      PT LONG TERM GOAL #4   Title Pt will be able to perform overhead 3lb transfer x 5 reps with Rt UE for improved functional use of Rt UE.    Baseline working on mechanics    Time 8    Period Weeks    Status On-going                 Plan - 04/08/21 1719     Clinical Impression Statement Patient having increased pain this date. Reporting significant tenderness to palpation of Rt cervical paraspinals. Activity limited due to increased pain this date. Would benefit from continued skilled therapeutic intervention through remaining visits for decreased pain and improved QOL.    Personal Factors and Comorbidities Age;Time since onset of injury/illness/exacerbation    Examination-Activity Limitations Transfers;Sit;Sleep;Lift;Carry;Bed Mobility;Reach Overhead;Bathing;Hygiene/Grooming    Examination-Participation Restrictions Meal Prep;Cleaning;Shop    Rehab Potential Fair    PT Frequency 2x / week    PT Duration 8 weeks    PT Treatment/Interventions ADLs/Self Care Home Management;Electrical Stimulation;Cryotherapy;Moist Heat;Joint Manipulations;Spinal Manipulations;Taping;Passive range of motion;Dry needling;Manual techniques;Patient/family education;Functional mobility training;Therapeutic activities;Therapeutic exercise;Neuromuscular re-education;Iontophoresis 4mg /ml Dexamethasone;Traction    PT Next Visit Plan work on right shoulder ROM and mobilization, work on scapula stabilization with cerivcal neutral    PT Home Exercise Plan Access Code: [email or web address]    Consulted and Agree with Plan of Care Patient           Patient will benefit from skilled therapeutic intervention in order to improve the following deficits and impairments:  Decreased range of motion,Impaired tone,Increased muscle spasms,Pain,Impaired UE functional use,Decreased activity tolerance,Decreased mobility,Decreased strength,Postural dysfunction,Hypomobility  Visit Diagnosis: Abnormal posture  Cervicalgia  Cramp and spasm  Muscle weakness (generalized)  Radiculopathy, cervical region     Problem List Patient Active Problem List   Diagnosis Date Noted  . Degenerative cervical disc 01/16/2021  . Right shoulder pain 01/16/2021  . Degenerative arthritis of knee 05/28/2020  .  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 Leg pain, right 10/03/2012  . Leg swelling 10/03/2012  . Right leg DVT ([place]) 10/03/2012  . HLD (hyperlipidemia) 07/25/2012  . Acute thromboembolism of deep veins of lower extremity (HCC) 12/24/2010   Everardo All PT, DPT  04/08/21 5:23 PM  [facility] [address] [place], [place], [address] Phone: [phone]   Fax:  [phone]  Name: Ryu Cerreta  MRN: [id number] Date of Birth: [birth year]

## 2021-04-13 ENCOUNTER — Ambulatory Visit: Payer: Medicare Other | Admitting: Physical Therapy

## 2021-04-16 ENCOUNTER — Ambulatory Visit: Payer: Medicaid Other | Admitting: Specialist

## 2021-04-16 ENCOUNTER — Encounter: Payer: Self-pay | Admitting: Specialist

## 2021-04-16 ENCOUNTER — Other Ambulatory Visit: Payer: Self-pay

## 2021-04-16 VITALS — BP 131/76 | HR 64 | Ht 70.0 in | Wt 195.0 lb

## 2021-04-16 DIAGNOSIS — M4726 Other spondylosis with radiculopathy, lumbar region: Secondary | ICD-10-CM

## 2021-04-16 DIAGNOSIS — M4722 Other spondylosis with radiculopathy, cervical region: Secondary | ICD-10-CM

## 2021-04-16 DIAGNOSIS — M5412 Radiculopathy, cervical region: Secondary | ICD-10-CM | POA: Diagnosis not present

## 2021-04-16 DIAGNOSIS — M542 Cervicalgia: Secondary | ICD-10-CM

## 2021-04-16 NOTE — Patient Instructions (Signed)
Avoid overhead lifting and overhead use of the arms. Do not lift greater than 5 lbs. Adjust head rest in vehicle to prevent hyperextension if rear ended. Take extra precautions to avoid falling, Voltaren gel for the knee and the hands, 2-4 gms 3-4 times per day. Allopurinol for pseudogout. Continue PT then transition to a home exercise program, consider enrolling in  A long term yoga or balance and stretching programs.

## 2021-04-16 NOTE — Progress Notes (Signed)
Office Visit Note   Patient: Andrew [name]           Date of Birth: [birth year]           MRN: [id number] Visit Date: 04/16/2021              Requested by: Bernerd Limbo, MD [address] [place],  [place] [id number] PCP: Bernerd Limbo, MD   Assessment & Plan: Visit Diagnoses: No diagnosis found.  Plan: Avoid overhead lifting and overhead use of the arms. Do not lift greater than 5 lbs. Adjust head rest in vehicle to prevent hyperextension if rear ended. Take extra precautions to avoid falling, Voltaren gel for the knee and the hands, 2-4 gms 3-4 times per day. Allopurinol for pseudogout. Continue PT then transition to a home exercise program, consider enrolling in  A long term yoga or balance and stretching programs.  Follow-Up Instructions: Return in about 2 months (around 06/16/2021).   Orders:  No orders of the defined types were placed in this encounter.  No orders of the defined types were placed in this encounter.     Procedures: No procedures performed   Clinical Data: No additional findings.   Subjective: Chief Complaint  Patient presents with  . Neck - Pain    69 right handed male speaks mainly [place]. He has been experiencing cervicalgia and has been in a therapy program. He has had history of pseudogout and is taking allopurinal and also gabapentin. He has problems with clumbsiness and drops items mainly due to finger stiffness. He has been in a PT program and has made good progress. He has less pain and is sleeping better. No numbness or tingling. No bowel or bladder difficulty, no gait disturbance. He is on xarelto for blood clot that he had nearly 20 years ago, came off then had another episode.   Review of Systems  Constitutional: Negative.   HENT: Negative.   Eyes: Negative.   Respiratory: Negative.   Cardiovascular: Negative.   Gastrointestinal: Negative.   Endocrine: Negative.   Genitourinary: Negative.   Musculoskeletal:  Negative.   Skin: Negative.   Allergic/Immunologic: Negative.   Neurological: Negative.   Hematological: Negative.   Psychiatric/Behavioral: Negative.      Objective: Vital Signs: BP 131/76   Pulse 64   Ht 5\' 10"  (1.778 m)   Wt 195 lb (88.5 kg)   BMI 27.98 kg/m   Physical Exam Constitutional:      Appearance: He is well-developed.  HENT:     Head: Normocephalic and atraumatic.  Eyes:     Pupils: Pupils are equal, round, and reactive to light.  Pulmonary:     Effort: Pulmonary effort is normal.     Breath sounds: Normal breath sounds.  Abdominal:     General: Bowel sounds are normal.     Palpations: Abdomen is soft.  Musculoskeletal:     Cervical back: Normal range of motion and neck supple.  Skin:    General: Skin is warm and dry.  Neurological:     Mental Status: He is alert and oriented to person, place, and time.  Psychiatric:        Behavior: Behavior normal.        Thought Content: Thought content normal.        Judgment: Judgment normal.     Back Exam   Tenderness  The patient is experiencing tenderness in the cervical.  Range of Motion  Extension: abnormal  Flexion: normal  Lateral  bend right: normal  Lateral bend left: normal  Rotation right: normal  Rotation left: normal   Muscle Strength  Right Quadriceps:  5/5  Left Quadriceps:  5/5  Right Hamstrings:  5/5  Left Hamstrings:  5/5   Reflexes  Patellar: 1/4 Achilles: 1/4 Biceps: 1/4 Babinski's sign: normal   Other  Toe walk: abnormal Heel walk: abnormal Sensation: normal Gait: normal  Erythema: no back redness Scars: absent      Specialty Comments:  No specialty comments available.  Imaging: No results found.   PMFS History: Patient Active Problem List   Diagnosis Date Noted  . Degenerative cervical disc 01/16/2021  . Right shoulder pain 01/16/2021  . Degenerative arthritis of knee 05/28/2020  . Spondylosis without myelopathy or radiculopathy, lumbar region  10/11/2016  . Leg pain, right 10/03/2012  . Leg swelling 10/03/2012  . Right leg DVT ([place]) 10/03/2012  . HLD (hyperlipidemia) 07/25/2012  . Acute thromboembolism of deep veins of lower extremity ([place]) 12/24/2010   Past Medical History:  Diagnosis Date  . DVT (deep venous thrombosis) (HCC)    right leg in 2011; prior injury to right foot  . HLD (hyperlipidemia)   . Right foot injury     Family History  Problem Relation Age of Onset  . Heart disease Mother   . Heart disease Father   . Vascular Disease Maternal Grandfather     History reviewed. No pertinent surgical history. Social History   Occupational History  . Not on file  Tobacco Use  . Smoking status: Former Smoker    Types: Cigarettes    Quit date: 07/25/1982    Years since quitting: 38.7  . Smokeless tobacco: Never Used  . Tobacco comment: REMOTE SOCIAL HISTORY  Substance and Sexual Activity  . Alcohol use: No  . Drug use: No  . Sexual activity: Not Currently

## 2021-04-17 ENCOUNTER — Encounter: Payer: Self-pay | Admitting: Physical Therapy

## 2021-04-17 ENCOUNTER — Other Ambulatory Visit: Payer: Self-pay

## 2021-04-17 ENCOUNTER — Ambulatory Visit: Payer: Medicare Other | Attending: Family Medicine | Admitting: Physical Therapy

## 2021-04-17 DIAGNOSIS — M542 Cervicalgia: Secondary | ICD-10-CM | POA: Insufficient documentation

## 2021-04-17 DIAGNOSIS — R252 Cramp and spasm: Secondary | ICD-10-CM

## 2021-04-17 DIAGNOSIS — R293 Abnormal posture: Secondary | ICD-10-CM | POA: Insufficient documentation

## 2021-04-17 DIAGNOSIS — M6281 Muscle weakness (generalized): Secondary | ICD-10-CM | POA: Diagnosis present

## 2021-04-17 DIAGNOSIS — M5412 Radiculopathy, cervical region: Secondary | ICD-10-CM | POA: Insufficient documentation

## 2021-04-17 NOTE — Therapy (Signed)
[facility] [address] [place], [place], [address] Phone: [phone]   Fax:  [phone]  Physical Therapy Treatment  Patient Details  Name: Andrew [name] MRN: [id number] Date of Birth: Dec 28, 1950 Referring Provider (PT): Lyndal Pulley, DO   Encounter Date: 04/17/2021   PT End of Session - 04/17/21 0802    Visit Number 14    Date for PT Re-Evaluation 05/04/21    Authorization Type 5 visits from 4/19 thru 5/23    Authorization Time Period thru 5/23    Authorization - Visit Number 2    Authorization - Number of Visits 5    Progress Note Due on Visit 20    PT Start Time 0802    PT Stop Time 0843    PT Time Calculation (min) 41 min    Activity Tolerance Patient tolerated treatment well;No increased pain    Behavior During Therapy WFL for tasks assessed/performed           Past Medical History:  Diagnosis Date  . DVT (deep venous thrombosis) (HCC)    right leg in 2011; prior injury to right foot  . HLD (hyperlipidemia)   . Right foot injury     History reviewed. No pertinent surgical history.  There were no vitals filed for this visit.   Subjective Assessment - 04/17/21 0802    Subjective Pattern of pain is the same: 3/10 in AM worsening as day goes on reaching 8/10 at night.    Pertinent History per Dr Louanne Skye: don't lift > 5lb, no overhead use of UEs, try TENS, cervical traction, McKenzie neck exercises    Limitations Other (comment);Sitting    Diagnostic tests cervical xray 01/16/21, MRI 01/28/21: multi-level disc degen worst at C6/7 with mod Rt foraminal stenosis and mild spinal stenosis    Patient Stated Goals sleep, reduce pain    Currently in Pain? Yes    Pain Score 3     Pain Location Neck    Pain Orientation Right    Pain Descriptors / Indicators Sore;Sharp;Tiring    Pain Type Chronic pain;Acute pain    Pain Onset More than a month ago    Pain Frequency Constant   varies in intensity    Aggravating Factors  sleep, ADLs    Effect of Pain on Daily Activities sleep                             [facility] Adult PT Treatment/Exercise - 04/17/21 0001      Self-Care   Self-Care Other Self-Care Comments    Other Self-Care Comments  PT encouraged Pt to decrease HEP from 2x daily to 1x daily in AM only given pain increase by afternoon/evening to see if this changes daily pattern of pain increase      Exercises   Exercises Elbow;Shoulder;Neck      Elbow Exercises   Elbow Flexion Strengthening;Right;Standing;Theraband;15 reps    Theraband Level (Elbow Flexion) Level 2 (Red)    Elbow Extension Strengthening;Both;15 reps;Standing;Theraband    Theraband Level (Elbow Extension) Level 2 (Red)      Neck Exercises: Standing   Other Standing Exercises using towel bil UE around head, resisted neck retraction, Rt SB and Lt SB 10x5 sec, PT TC for initial position of head over shoulders      Shoulder Exercises: Standing   Extension Strengthening;15 reps;Theraband    Theraband Level (Shoulder Extension) Level 2 (Red)    Row  Strengthening;Both;Theraband;15 reps    Theraband Level (Shoulder Row) Level 4 (Blue)      Shoulder Exercises: Pulleys   Flexion 2 minutes      Manual Therapy   Manual Therapy Manual Traction;Joint mobilization;Soft tissue mobilization    Joint Mobilization mid-lower cervical Gr I/II upglides in neutral or slight flexion, slight Lt Rot, thoracic PAs Gr I-III T2-T6, rib springing bil    Soft tissue mobilization Rt cervical paraspinals for inhibition and release    Manual Traction mid and lower cervical in neutral spine Gr II                    PT Short Term Goals - 03/23/21 1029      PT SHORT TERM GOAL #1   Title Pt ind with initial HEP    Time 2    Period Weeks    Status Achieved   Previously met   Target Date 02/17/21      PT SHORT TERM GOAL #2   Title Pt will be able to demo improved ability to correct forward head posture with  min pain    Time 4    Period Weeks    Status Achieved   Able to correct with mildly increased pain   Target Date 03/03/21      PT SHORT TERM GOAL #3   Title Pt will demo improved cervical ROM to at least 35 flexion, 25 ext, 50 bil Rot with min pain.    Time 4    Period Weeks    Status Achieved   AROM decreased this date but previously met   Target Date 03/03/21             PT Long Term Goals - 04/08/21 1721      PT LONG TERM GOAL #1   Title Pt will achieve at least 4/5 strength in Rt UE at shoulder and elbow and at least 40lb grip strength with min pain on testing for improved funcitonal use with ADLs.    Baseline grip 40lb Rt, Lt 65lb, 4-/5 abd, ER, flexion, 4/5 all other motions with pain    Time 8    Period Weeks    Status On-going      PT LONG TERM GOAL #4   Title Pt will be able to perform overhead 3lb transfer x 5 reps with Rt UE for improved functional use of Rt UE.    Baseline working on mechanics    Time 8    Period Weeks    Status On-going                 Plan - 04/17/21 1157    Clinical Impression Statement Pt saw MD yesterday and he recommended finishing up final appointments of PT with goal of d/c to HEP at that time.  Pt continues to have increasing pain as the day progresses, starting wtih 3/10 but reaching 8/10 at night.  He has been doing HEP in both morning and afternoon so PT advised he trial cutting afternoon ther ex and instead using modalities, supine positioning to see if he can control pain from reaching 8/10.  Doubling up on HEP in afternoon may be too much given he is active helping around the house during the day.  PT reviewed band exercises today and added towel resistance cervical SB isom today with good tolerance.  Pt needed intermittent cues for postural alignment during ther ex.  PT used manual techniques including traction, STM and joint mobs  for pain inhibition, spasm and gentle mobility with good tolerance today.  Pt rated pain 3/10 on  arrival with a slight increase to 4/10 with ther ex, improving again with manual techniques.  Pt is aware of plan to work towards d/c over 3 remaining visits.    PT Frequency 2x / week    PT Duration 8 weeks    PT Treatment/Interventions ADLs/Self Care Home Management;Electrical Stimulation;Cryotherapy;Moist Heat;Joint Manipulations;Spinal Manipulations;Taping;Passive range of motion;Dry needling;Manual techniques;Patient/family education;Functional mobility training;Therapeutic activities;Therapeutic exercise;Neuromuscular re-education;Iontophoresis 26m/ml Dexamethasone;Traction    PT Next Visit Plan manual techniques in neutral spine, prone scapular retraction and shoulder ext, GH, cervical, scap stab    PT Home Exercise Plan Access Code: [email or web address]    Consulted and Agree with Plan of Care Patient           Patient will benefit from skilled therapeutic intervention in order to improve the following deficits and impairments:     Visit Diagnosis: Cervicalgia  Muscle weakness (generalized)  Radiculopathy, cervical region  Abnormal posture  Cramp and spasm     Problem List Patient Active Problem List   Diagnosis Date Noted  . Degenerative cervical disc 01/16/2021  . Right shoulder pain 01/16/2021  . Degenerative arthritis of knee 05/28/2020  . 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 Leg pain, right 10/03/2012  . Leg swelling 10/03/2012  . Right leg DVT ([facility] 10/03/2012  . HLD (hyperlipidemia) 07/25/2012  . Acute thromboembolism of deep veins of lower extremity ([facility] 12/24/2010    Andrew [name] 04/17/2021, 12:02 PM  [facility] [address] [place] [address]Phone: [phone]  Fax:  [phone] Name: AAzarion HoveMRN: [id number] of Birth: 6December 01, 1952

## 2021-04-22 ENCOUNTER — Encounter: Payer: Self-pay | Admitting: Physical Therapy

## 2021-04-22 ENCOUNTER — Ambulatory Visit: Payer: Medicare Other | Admitting: Physical Therapy

## 2021-04-22 ENCOUNTER — Other Ambulatory Visit: Payer: Self-pay

## 2021-04-22 DIAGNOSIS — M6281 Muscle weakness (generalized): Secondary | ICD-10-CM

## 2021-04-22 DIAGNOSIS — M542 Cervicalgia: Secondary | ICD-10-CM

## 2021-04-22 DIAGNOSIS — R252 Cramp and spasm: Secondary | ICD-10-CM

## 2021-04-22 DIAGNOSIS — M5412 Radiculopathy, cervical region: Secondary | ICD-10-CM

## 2021-04-22 DIAGNOSIS — R293 Abnormal posture: Secondary | ICD-10-CM

## 2021-04-22 NOTE — Therapy (Signed)
[facility] [address] [place], [place], [address] Phone: [phone]   Fax:  [phone]  Physical Therapy Treatment  Patient Details  Name: Andrew [name] MRN: [id number] Date of Birth: [birth year] Referring Provider (PT): Lyndal Pulley, DO   Encounter Date: 04/22/2021   PT End of Session - 04/22/21 0932    Visit Number 15    Number of Visits 17    Date for PT Re-Evaluation 05/04/21    Authorization Type 5 visits from 4/19 thru 5/23    Authorization Time Period thru 5/23    Authorization - Visit Number 3    Authorization - Number of Visits 5    Progress Note Due on Visit 20    PT Start Time 0932    PT Stop Time 1010    PT Time Calculation (min) 38 min    Activity Tolerance Patient tolerated treatment well;No increased pain    Behavior During Therapy WFL for tasks assessed/performed           Past Medical History:  Diagnosis Date  . DVT (deep venous thrombosis) (HCC)    right leg in 2011; prior injury to right foot  . HLD (hyperlipidemia)   . Right foot injury     History reviewed. No pertinent surgical history.  There were no vitals filed for this visit.   Subjective Assessment - 04/22/21 0933    Subjective I tried doing the HEP only 1x/day vs 2x and it didn't make a difference in pain increase by night.  Went back to doing it twice a day to work on strength.  Only sleeping about 5 hours due to pain.    Pertinent History per Dr Louanne Skye: don't lift > 5lb, no overhead use of UEs, try TENS, cervical traction, McKenzie neck exercises    Limitations Other (comment);Sitting    How long can you sit comfortably? constant pain    How long can you stand comfortably? constant pain    How long can you walk comfortably? constant pain    Diagnostic tests cervical xray 01/16/21, MRI 01/28/21: multi-level disc degen worst at C6/7 with mod Rt foraminal stenosis and mild spinal stenosis    Patient Stated Goals sleep,  reduce pain    Currently in Pain? Yes    Pain Score 3     Pain Location Neck    Pain Orientation Right    Pain Descriptors / Indicators Spasm;Sore;Sharp;Tiring    Pain Type Chronic pain    Pain Onset More than a month ago    Pain Frequency Constant    Aggravating Factors  sleep, ADLs    Pain Relieving Factors HEP, shower, TENS    Effect of Pain on Daily Activities sleep                             [facility] Adult PT Treatment/Exercise - 04/22/21 0001      Neuro Re-ed    Neuro Re-ed Details  supine neck sit ups straight and diagonal with TCs by PT and manual facil for form initially, then added manual resistance in concentric and eccentric phases      Exercises   Exercises Shoulder;Neck;Elbow      Neck Exercises: Supine   Cervical Rotation Both;5 reps    Cervical Rotation Limitations in retraction, hold 5 sec      Shoulder Exercises: Supine   Horizontal ABduction Strengthening;Both;Theraband;15 reps    Theraband Level (Shoulder Horizontal  ABduction) Level 4 (Blue)    Other Supine Exercises chest press with cane + 5lb ankle weight on cane x 20 reps with neck retraction    Other Supine Exercises extension from 90 deg to 0 blue band bil x 10 reps, PT holding middle of band as anchor      Shoulder Exercises: Seated   Other Seated Exercises 3x10" thoracic rotation    Other Seated Exercises trunk SB stretch with overhead reach 3x5" holds      Shoulder Exercises: Standing   Other Standing Exercises doorway stretch 3x10 sec    Other Standing Exercises 3-way raises 3lb x 10 rounds      Shoulder Exercises: ROM/Strengthening   UBE (Upper Arm Bike) L2 2x2 PT present to monitor for tolerance and update pain symptoms      Shoulder Exercises: Power [employer] 10 reps    Row Limitations resisted walking in static scapular row x 5 back/forth, then 10 dynamic rows 25lb                    PT Short Term Goals - 03/23/21 1029      PT SHORT TERM GOAL #1   Title  Pt ind with initial HEP    Time 2    Period Weeks    Status Achieved   Previously met   Target Date 02/17/21      PT SHORT TERM GOAL #2   Title Pt will be able to demo improved ability to correct forward head posture with min pain    Time 4    Period Weeks    Status Achieved   Able to correct with mildly increased pain   Target Date 03/03/21      PT SHORT TERM GOAL #3   Title Pt will demo improved cervical ROM to at least 35 flexion, 25 ext, 50 bil Rot with min pain.    Time 4    Period Weeks    Status Achieved   AROM decreased this date but previously met   Target Date 03/03/21             PT Long Term Goals - 04/08/21 1721      PT LONG TERM GOAL #1   Title Pt will achieve at least 4/5 strength in Rt UE at shoulder and elbow and at least 40lb grip strength with min pain on testing for improved funcitonal use with ADLs.    Baseline grip 40lb Rt, Lt 65lb, 4-/5 abd, ER, flexion, 4/5 all other motions with pain    Time 8    Period Weeks    Status On-going      PT LONG TERM GOAL #4   Title Pt will be able to perform overhead 3lb transfer x 5 reps with Rt UE for improved functional use of Rt UE.    Baseline working on mechanics    Time 8    Period Weeks    Status On-going                 Plan - 04/22/21 1009    Clinical Impression Statement Pt demos significantly improved posture and form with ther ex.  He has improved control in eccentric phase for all weights and band resistance exercises.  PT added cervical sit ups straight and diag Rt/Lt in supine today with initial need for TC but then good return demo by Pt with good tolerance.  PT reinforced need for ongoing thoracic spine stretches due  to stiffness in t-spine which is improving over time.  Pt continues to be limited to 5 hours of sleep secondary to pain and pain continues to worsen as day progresses.  Pt trialed doing HEP in AM only but this did not improve afternoon/evening pain, so he is still performing HEP  twice daily which has significantly benefitted his ROM, posture and strength despite high pain levels in evening.  Pt reports these final sessions are helpful to him to reinforce his memory for his HEP and form.  He will benefit from ongoing skilled PT with anticipated d/c in 2 visits.    PT Frequency 2x / week    PT Duration 8 weeks    PT Treatment/Interventions ADLs/Self Care Home Management;Electrical Stimulation;Cryotherapy;Moist Heat;Joint Manipulations;Spinal Manipulations;Taping;Passive range of motion;Dry needling;Manual techniques;Patient/family education;Functional mobility training;Therapeutic activities;Therapeutic exercise;Neuromuscular re-education;Iontophoresis 11m/ml Dexamethasone;Traction    PT Next Visit Plan continue neck sit ups with TC as needed, thoracic spine stretches as tol, weights and bands below 90 deg    PT Home Exercise Plan Access Code: [email or web address]    Consulted and Agree with Plan of Care Patient           Patient will benefit from skilled therapeutic intervention in order to improve the following deficits and impairments:     Visit Diagnosis: Cervicalgia  Muscle weakness (generalized)  Radiculopathy, cervical region  Abnormal posture  Cramp and spasm     Problem List Patient Active Problem List   Diagnosis Date Noted  . Degenerative cervical disc 01/16/2021  . Right shoulder pain 01/16/2021  . Degenerative arthritis of knee 05/28/2020  . 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 Leg pain, right 10/03/2012  . Leg swelling 10/03/2012  . Right leg DVT ([facility] [place] 10/03/2012  . HLD (hyperlipidemia) 07/25/2012  . Acute thromboembolism of deep veins of lower extremity ([facility] 12/24/2010    Tyner Codner, PT 04/22/21 10:15 AM    [facility] [address] [place] [address]Phone: [phone]  Fax:  [phone] Name: AHeaton SarinMRN:  [id number] of Birth: 631-Oct-1952

## 2021-04-24 ENCOUNTER — Encounter: Payer: Medicaid Other | Admitting: Physical Therapy

## 2021-04-29 ENCOUNTER — Ambulatory Visit: Payer: Medicare Other | Admitting: Physical Therapy

## 2021-04-29 ENCOUNTER — Other Ambulatory Visit: Payer: Self-pay

## 2021-04-29 ENCOUNTER — Encounter: Payer: Self-pay | Admitting: Physical Therapy

## 2021-04-29 DIAGNOSIS — M6281 Muscle weakness (generalized): Secondary | ICD-10-CM

## 2021-04-29 DIAGNOSIS — M5412 Radiculopathy, cervical region: Secondary | ICD-10-CM

## 2021-04-29 DIAGNOSIS — R252 Cramp and spasm: Secondary | ICD-10-CM

## 2021-04-29 DIAGNOSIS — M542 Cervicalgia: Secondary | ICD-10-CM

## 2021-04-29 DIAGNOSIS — R293 Abnormal posture: Secondary | ICD-10-CM

## 2021-04-29 NOTE — Therapy (Signed)
[facility] [address] [place], [place], [address] Phone: [phone]   Fax:  [phone]  Physical Therapy Treatment  Patient Details  Name: Andrew [name] MRN: [id number] Date of Birth: June 22, 1951 Referring Provider (PT): Lyndal Pulley, DO   Encounter Date: 04/29/2021   PT End of Session - 04/29/21 0849    Visit Number 16    Number of Visits 17    Date for PT Re-Evaluation 05/04/21    Authorization Type 5 visits from 4/19 thru 5/23    Authorization Time Period thru 5/23    Authorization - Visit Number 4    Authorization - Number of Visits 5    Progress Note Due on Visit 20    PT Start Time 0845    PT Stop Time 8341   Pt wished to wrap up early due to pain   PT Time Calculation (min) 35 min    Activity Tolerance Patient tolerated treatment well;Patient limited by pain;No increased pain    Behavior During Therapy WFL for tasks assessed/performed           Past Medical History:  Diagnosis Date  . DVT (deep venous thrombosis) (HCC)    right leg in 2011; prior injury to right foot  . HLD (hyperlipidemia)   . Right foot injury     History reviewed. No pertinent surgical history.  There were no vitals filed for this visit.   Subjective Assessment - 04/29/21 0847    Subjective Pain is higher today in my neck and my Lt knee meniscus is bothering me too.  Had injection but it didn't help.    Pertinent History per Dr Louanne Skye: don't lift > 5lb, no overhead use of UEs, try TENS, cervical traction, McKenzie neck exercises    How long can you sit comfortably? constant pain    How long can you stand comfortably? constant pain    How long can you walk comfortably? constant pain    Diagnostic tests cervical xray 01/16/21, MRI 01/28/21: multi-level disc degen worst at C6/7 with mod Rt foraminal stenosis and mild spinal stenosis    Patient Stated Goals sleep, reduce pain    Currently in Pain? Yes    Pain Score 6      Pain Location Neck    Pain Orientation Right    Pain Descriptors / Indicators Sore;Spasm;Sharp;Aching;Tiring    Pain Type Chronic pain    Pain Onset More than a month ago    Pain Frequency Constant    Aggravating Factors  sleep, ADLs    Pain Relieving Factors HEP, shower, TENS    Effect of Pain on Daily Activities sleep                             [facility] Adult PT Treatment/Exercise - 04/29/21 0001      Exercises   Exercises Shoulder;Neck;Elbow      Shoulder Exercises: Standing   Other Standing Exercises neck McKenzie extension PT TC for form for first 3 reps, 8 reps totat      Shoulder Exercises: ROM/Strengthening   UBE (Upper Arm Bike) L1.5 2x2 PT present to monitor for tolerance and update pain symptoms      Shoulder Exercises: Stretch   Corner Stretch Limitations doorway 3x15 sec    Other Shoulder Stretches seated thoracic rotation with overpressure bil 3x10      Manual Therapy   Manual Therapy Soft tissue mobilization;Joint mobilization;Manual Traction  Joint Mobilization Gr I sideglides C6, C7 bil    Soft tissue mobilization upper trap and Rt cerivcal paraspinals    Manual Traction Gr I/II in neutral spine, lower cervical and upper t-spine hand placement x 15'                    PT Short Term Goals - 03/23/21 1029      PT SHORT TERM GOAL #1   Title Pt ind with initial HEP    Time 2    Period Weeks    Status Achieved   Previously met   Target Date 02/17/21      PT SHORT TERM GOAL #2   Title Pt will be able to demo improved ability to correct forward head posture with min pain    Time 4    Period Weeks    Status Achieved   Able to correct with mildly increased pain   Target Date 03/03/21      PT SHORT TERM GOAL #3   Title Pt will demo improved cervical ROM to at least 35 flexion, 25 ext, 50 bil Rot with min pain.    Time 4    Period Weeks    Status Achieved   AROM decreased this date but previously met   Target Date 03/03/21              PT Long Term Goals - 04/08/21 1721      PT LONG TERM GOAL #1   Title Pt will achieve at least 4/5 strength in Rt UE at shoulder and elbow and at least 40lb grip strength with min pain on testing for improved funcitonal use with ADLs.    Baseline grip 40lb Rt, Lt 65lb, 4-/5 abd, ER, flexion, 4/5 all other motions with pain    Time 8    Period Weeks    Status On-going      PT LONG TERM GOAL #4   Title Pt will be able to perform overhead 3lb transfer x 5 reps with Rt UE for improved functional use of Rt UE.    Baseline working on mechanics    Time 8    Period Weeks    Status On-going                 Plan - 04/29/21 0810    Clinical Impression Statement Pt arrived with increased pain rated 6-7/10 which is unusual for his pain pattern (better in AM, worse as day progresses).  He had signif relief and release with manual traction today and even was able to rest enough to doze off with traction.  PT and Pt decided to skip ther ex beyond initial warm up and stretching today due to pain.  He reported reduction of pain to 4/10 end of session.  He plans to use his modalities at home (heat, TENS) today for further relief.  Pt has one more session and if feeling better will benefit from review of final aspects of HEP for form reinforcement.    Rehab Potential Fair    PT Frequency 2x / week    PT Duration 8 weeks    PT Treatment/Interventions ADLs/Self Care Home Management;Electrical Stimulation;Cryotherapy;Moist Heat;Joint Manipulations;Spinal Manipulations;Taping;Passive range of motion;Dry needling;Manual techniques;Patient/family education;Functional mobility training;Therapeutic activities;Therapeutic exercise;Neuromuscular re-education;Iontophoresis 4mg /ml Dexamethasone;Traction    PT Next Visit Plan ERO with d/c to HEP    PT Home Exercise Plan Access Code: [email or web address]    Consulted and Agree with Plan of Care Patient  Patient will benefit from skilled therapeutic  intervention in order to improve the following deficits and impairments:     Visit Diagnosis: Cervicalgia  Muscle weakness (generalized)  Radiculopathy, cervical region  Abnormal posture  Cramp and spasm     Problem List Patient Active Problem List   Diagnosis Date Noted  . Degenerative cervical disc 01/16/2021  . Right shoulder pain 01/16/2021  . Degenerative arthritis of knee 05/28/2020  . 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 Leg pain, right 10/03/2012  . Leg swelling 10/03/2012  . Right leg DVT ([place]) 10/03/2012  . HLD (hyperlipidemia) 07/25/2012  . Acute thromboembolism of deep veins of lower extremity ([place]) 12/24/2010    Sherea Liptak, PT 04/29/21 9:25 AM   [facility] [address] [place], [place], [address] Phone: [phone]   Fax:  [phone]  Name: Berlie Hatchel MRN: [id number] Date of Birth: Apr 05, 1951

## 2021-05-01 ENCOUNTER — Encounter: Payer: Medicaid Other | Admitting: Physical Therapy

## 2021-05-04 ENCOUNTER — Ambulatory Visit: Payer: Medicare Other | Admitting: Physical Therapy

## 2021-05-04 ENCOUNTER — Encounter: Payer: Self-pay | Admitting: Physical Therapy

## 2021-05-04 DIAGNOSIS — M6281 Muscle weakness (generalized): Secondary | ICD-10-CM

## 2021-05-04 DIAGNOSIS — M5412 Radiculopathy, cervical region: Secondary | ICD-10-CM

## 2021-05-04 DIAGNOSIS — R293 Abnormal posture: Secondary | ICD-10-CM

## 2021-05-04 DIAGNOSIS — M542 Cervicalgia: Secondary | ICD-10-CM | POA: Diagnosis not present

## 2021-05-04 NOTE — Therapy (Signed)
[facility] [address] [place], [place], [address] Phone: [phone]   Fax:  [phone]  Physical Therapy Treatment  Patient Details  Name: Andrew [name] MRN: [id number] Date of Birth: 08-23-1951 Referring Provider (PT): Judi Saa, DO   Encounter Date: 05/04/2021   PT End of Session - 05/04/21 0852    Visit Number 18    Date for PT Re-Evaluation 05/04/21    Authorization Type 5 visits from 4/19 thru 5/23    Authorization Time Period thru 5/23    Authorization - Visit Number 5    Authorization - Number of Visits 5    Progress Note Due on Visit 20    PT Start Time 0845    PT Stop Time 0923    PT Time Calculation (min) 38 min    Activity Tolerance Patient tolerated treatment well;Patient limited by pain;No increased pain    Behavior During Therapy WFL for tasks assessed/performed           Past Medical History:  Diagnosis Date  . DVT (deep venous thrombosis) (HCC)    right leg in 2011; prior injury to right foot  . HLD (hyperlipidemia)   . Right foot injury     History reviewed. No pertinent surgical history.  There were no vitals filed for this visit.   Subjective Assessment - 05/04/21 0850    Subjective Pain is 4-5/10 today.  Pt points to Rt upper trap.  When asked he agreed to wanting to review his HEP since today is last visit.    Pertinent History per Dr Otelia Sergeant: don't lift > 5lb, no overhead use of UEs, try TENS, cervical traction, McKenzie neck exercises    Limitations Other (comment);Sitting    How long can you sit comfortably? constant pain    How long can you stand comfortably? constant pain    How long can you walk comfortably? constant pain    Diagnostic tests cervical xray 01/16/21, MRI 01/28/21: multi-level disc degen worst at C6/7 with mod Rt foraminal stenosis and mild spinal stenosis    Patient Stated Goals sleep, reduce pain    Currently in Pain? Yes    Pain Score 5     Pain  Location Neck    Pain Orientation Right    Pain Descriptors / Indicators Aching;Sore    Pain Type Chronic pain    Pain Onset More than a month ago    Pain Frequency Constant    Aggravating Factors  sleep, ADLs    Pain Relieving Factors HEP, shower, TENS    Effect of Pain on Daily Activities sleep              [facility] PT Assessment - 05/04/21 0001      Assessment   Medical Diagnosis M54.12 (ICD-10-CM) - Cervical radiculopathy    Referring Provider (PT) Judi Saa, DO    Onset Date/Surgical Date --   3 mos   Hand Dominance Right    Prior Therapy no      AROM   Right Shoulder Flexion 140 Degrees   pain, firm end feel   Right Shoulder ABduction 155 Degrees    Cervical Flexion 50    Cervical Extension 30    Cervical - Right Rotation 60   pain on Rt   Cervical - Left Rotation 60      Strength   Strength Assessment Site Hand    Right/Left Shoulder Right    Right Shoulder Flexion 4+/5  Right Shoulder Extension 4+/5    Right Shoulder ABduction 4/5    Right Shoulder Internal Rotation 4+/5    Right Shoulder External Rotation 4/5    Right Elbow Flexion 4+/5    Right Elbow Extension 4/5    Right/Left hand Right;Left    Right Hand Grip (lbs) 52    Left Hand Grip (lbs) 60                         [facility] Adult PT Treatment/Exercise - 05/04/21 0001      Exercises   Exercises Shoulder;Neck;Elbow      Elbow Exercises   Elbow Flexion Strengthening;Both;20 reps;Theraband    Theraband Level (Elbow Flexion) Level 3 (Green)    Elbow Extension Strengthening;Both;20 reps;Theraband    Theraband Level (Elbow Extension) Level 4 (Blue)      Neck Exercises: Standing   Neck Retraction 10 reps;5 secs    Neck Retraction Limitations bil UE towel self-resistance around back of head      Shoulder Exercises: Supine   Horizontal ABduction Strengthening;Both;Theraband;20 reps    Theraband Level (Shoulder Horizontal ABduction) Level 2 (Red)      Shoulder Exercises:  Standing   External Rotation Strengthening;Both;20 reps;Theraband    Theraband Level (Shoulder External Rotation) Level 2 (Red)    Extension Strengthening;Both;20 reps;Theraband    Theraband Level (Shoulder Extension) Level 4 (Blue)    Row Strengthening;Both;15 reps;Theraband    Theraband Level (Shoulder Row) Level 4 (Blue)      Shoulder Exercises: ROM/Strengthening   UBE (Upper Arm Bike) 2x2 fwd/bwd PT present to reivew goals      Shoulder Exercises: Stretch   Corner Stretch Limitations doorway 3x15 sec                    PT Short Term Goals - 03/23/21 1029      PT SHORT TERM GOAL #1   Title Pt ind with initial HEP    Time 2    Period Weeks    Status Achieved   Previously met   Target Date 02/17/21      PT SHORT TERM GOAL #2   Title Pt will be able to demo improved ability to correct forward head posture with min pain    Time 4    Period Weeks    Status Achieved   Able to correct with mildly increased pain   Target Date 03/03/21      PT SHORT TERM GOAL #3   Title Pt will demo improved cervical ROM to at least 35 flexion, 25 ext, 50 bil Rot with min pain.    Time 4    Period Weeks    Status Achieved   AROM decreased this date but previously met   Target Date 03/03/21             PT Long Term Goals - 05/04/21 0919      PT LONG TERM GOAL #1   Title Pt will achieve at least 4/5 strength in Rt UE at shoulder and elbow and at least 40lb grip strength with min pain on testing for improved funcitonal use with ADLs.    Status Achieved      PT LONG TERM GOAL #2   Title Pt will report at least 50% improved sleep disruption secondary to improved pain with bed positioning and bed mobility.    Baseline sleeps 5-6 hours, most pain is at night    Status Partially Met  PT LONG TERM GOAL #3   Title Pt will report reduced pain by at least 50% with ADLs throughout the day.    Baseline 60%    Status Achieved      PT LONG TERM GOAL #4   Title Pt will be able to  perform overhead 3lb transfer x 5 reps with Rt UE for improved functional use of Rt UE.    Baseline not using UE overhead per MD    Status Deferred                 Plan - 05/04/21 4765    Clinical Impression Statement Pt has met all goals for ROM and strength for neck and Rt UE.  He continues to have chronic pain related to cervical spinal stenosis.  Pain is right sided in neck, shoulder and upper arm but no longer extends below the elbow.  Pain is best on waking in AM but worsens throughout the day with limited sleep to 5-6 hours a night, limited by pain.  He reports 60% improvement overall in pain with daily activities.  He is compliant with HEP.  Rt grip (52lb Rt, 60lb Lt) and shoulder strength (4-4+/5) have improved significantly.  He demonstrates cervical ROM WFL with improved range in all planes, with pain at end range ext and Rt rotation.  Pt has home TENS unit and understands MD restrictions for lifting and overhead use of bil UEs.  PT reviewed HEP to reinforce form today.  Pt is ready to d/c to HEP due to goals being met and plateau in progress.    Personal Factors and Comorbidities Age;Time since onset of injury/illness/exacerbation    Examination-Activity Limitations Transfers;Sit;Sleep;Lift;Carry;Bed Mobility;Reach Overhead;Bathing;Hygiene/Grooming    PT Frequency 2x / week    PT Duration 8 weeks    PT Treatment/Interventions ADLs/Self Care Home Management;Electrical Stimulation;Cryotherapy;Moist Heat;Joint Manipulations;Spinal Manipulations;Taping;Passive range of motion;Dry needling;Manual techniques;Patient/family education;Functional mobility training;Therapeutic activities;Therapeutic exercise;Neuromuscular re-education;Iontophoresis 4mg /ml Dexamethasone;Traction    PT Next Visit Plan d/c to HEP with return as needed with new MD Rx    PT Home Exercise Plan Access Code: [email or web address]    Consulted and Agree with Plan of Care Patient           Patient will benefit from skilled  therapeutic intervention in order to improve the following deficits and impairments:  Decreased range of motion,Impaired tone,Increased muscle spasms,Pain,Impaired UE functional use,Decreased activity tolerance,Decreased mobility,Decreased strength,Postural dysfunction,Hypomobility  Visit Diagnosis: Cervicalgia  Muscle weakness (generalized)  Radiculopathy, cervical region  Abnormal posture     Problem List Patient Active Problem List   Diagnosis Date Noted  . Degenerative cervical disc 01/16/2021  . Right shoulder pain 01/16/2021  . Degenerative arthritis of knee 05/28/2020  . 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 Leg pain, right 10/03/2012  . Leg swelling 10/03/2012  . Right leg DVT ([place]) 10/03/2012  . HLD (hyperlipidemia) 07/25/2012  . Acute thromboembolism of deep veins of lower extremity ([place]) 12/24/2010    PHYSICAL THERAPY DISCHARGE SUMMARY  Visits from Start of Care: 18  Current functional level related to goals / functional outcomes: See above   Remaining deficits: See above   Education / Equipment: HEP Plan: Patient agrees to discharge.  Patient goals were met. Patient is being discharged due to meeting the stated rehab goals.  ?????         Baruch Merl, PT 05/04/21 9:28 AM   [facility] [address] [place], [place],  [address] Phone: [phone]   Fax:  [phone]  Name: Maximo Spratling MRN: [id number] Date of Birth: 05/27/1951

## 2021-05-06 ENCOUNTER — Encounter: Payer: Medicaid Other | Admitting: Physical Therapy

## 2021-05-08 ENCOUNTER — Encounter: Payer: Medicaid Other | Admitting: Physical Therapy

## 2021-05-13 ENCOUNTER — Encounter: Payer: Self-pay | Admitting: Podiatry

## 2021-05-13 ENCOUNTER — Other Ambulatory Visit: Payer: Self-pay | Admitting: Podiatry

## 2021-05-13 ENCOUNTER — Other Ambulatory Visit: Payer: Self-pay

## 2021-05-13 ENCOUNTER — Encounter: Payer: Medicaid Other | Admitting: Physical Therapy

## 2021-05-13 ENCOUNTER — Ambulatory Visit (INDEPENDENT_AMBULATORY_CARE_PROVIDER_SITE_OTHER): Payer: Medicare Other | Admitting: Podiatry

## 2021-05-13 ENCOUNTER — Ambulatory Visit: Payer: Medicaid Other

## 2021-05-13 ENCOUNTER — Ambulatory Visit (INDEPENDENT_AMBULATORY_CARE_PROVIDER_SITE_OTHER): Payer: Medicare Other

## 2021-05-13 DIAGNOSIS — M7751 Other enthesopathy of right foot: Secondary | ICD-10-CM | POA: Diagnosis not present

## 2021-05-13 DIAGNOSIS — M779 Enthesopathy, unspecified: Secondary | ICD-10-CM

## 2021-05-13 DIAGNOSIS — Z86718 Personal history of other venous thrombosis and embolism: Secondary | ICD-10-CM | POA: Insufficient documentation

## 2021-05-13 DIAGNOSIS — M778 Other enthesopathies, not elsewhere classified: Secondary | ICD-10-CM | POA: Diagnosis not present

## 2021-05-13 MED ORDER — BETAMETHASONE SOD PHOS & ACET 6 (3-3) MG/ML IJ SUSP
3.0000 mg | Freq: Once | INTRAMUSCULAR | Status: AC
  Administered 2021-05-13: 3 mg via INTRA_ARTICULAR

## 2021-05-13 MED ORDER — MELOXICAM 15 MG PO TABS: 15.0000 mg | ORAL_TABLET | Freq: Every day | ORAL | 1 refills | Status: DC

## 2021-05-13 NOTE — Progress Notes (Signed)
   HPI: 70 y.o. male presenting today as a new patient with his brother for evaluation of right foot pain is been going on approximately 1 month.  Patient does have a history of lumbar radiculopathy.  He states that he is always experience pins and needle sensation to the foot.  Most recently over the past month he has had increased pain and tenderness especially when getting out of bed in the mornings.  He denies a history of injury.  He has not done anything for treatment.  Past Medical History:  Diagnosis Date  . DVT (deep venous thrombosis) (HCC)    right leg in 2011; prior injury to right foot  . HLD (hyperlipidemia)   . Right foot injury      Physical Exam: General: The patient is alert and oriented x3 in no acute distress.  Dermatology: Skin is warm, dry and supple bilateral lower extremities. Negative for open lesions or macerations.  Vascular: Palpable pedal pulses bilaterally. No edema or erythema noted. Capillary refill within normal limits.  Neurological: Epicritic and protective threshold grossly intact bilaterally.   Musculoskeletal Exam: Range of motion within normal limits to all pedal and ankle joints bilateral. Muscle strength 5/5 in all groups bilateral.  Pain on palpation overlying the Lisfranc joint right foot.  There is also some mild pain with palpation to the plantar fascia medial calcaneal tubercle right.  Radiographic Exam:  Normal osseous mineralization. No fracture/dislocation/boney destruction.  There does appear to be some degenerative changes at the second TMT joint.  Assessment: 1.  Capsulitis/DJD right TMT joint   Plan of Care:  1. Patient evaluated. X-Rays reviewed.  2.  Injection of 0.5 cc Celestone Soluspan injection to the right Lisfranc joint around the second TMT 3.  Prescription for meloxicam 15 mg daily 4.  Continue wearing [place] shoes.  Patient admits to going barefoot around the house.  Advised against this. 5.  Return to clinic in 4  weeks  *From [place].       Edrick Kins, DPM [facility]  Dr. Edrick Kins, DPM    [address],  [address]                Office [phone]  Fax [phone]

## 2021-05-15 ENCOUNTER — Encounter: Payer: Medicaid Other | Admitting: Physical Therapy

## 2021-05-18 ENCOUNTER — Encounter: Payer: Medicaid Other | Admitting: Physical Therapy

## 2021-05-20 ENCOUNTER — Encounter: Payer: Medicaid Other | Admitting: Physical Therapy

## 2021-05-27 ENCOUNTER — Ambulatory Visit: Payer: Medicare Other | Attending: Orthopedic Surgery | Admitting: Physical Therapy

## 2021-05-27 ENCOUNTER — Other Ambulatory Visit: Payer: Self-pay

## 2021-05-27 DIAGNOSIS — M6281 Muscle weakness (generalized): Secondary | ICD-10-CM | POA: Insufficient documentation

## 2021-05-27 DIAGNOSIS — R252 Cramp and spasm: Secondary | ICD-10-CM

## 2021-05-27 DIAGNOSIS — M25562 Pain in left knee: Secondary | ICD-10-CM | POA: Diagnosis present

## 2021-05-27 NOTE — Therapy (Signed)
[facility] [address], [place], [place], [address] Phone: [phone]   Fax:  [phone]  Physical Therapy Evaluation  Patient Details  Name: Andrew [name] MRN: [id number] Date of Birth: [birth year] Referring Provider (PT): Vickey Huger, MD   Encounter Date: 05/27/2021   PT End of Session - 05/27/21 1312     Visit Number 1    Date for PT Re-Evaluation 07/22/21    Authorization Type [employer]: requesting 8 visits    Authorization Time Period Jackquline Denmark has used 18/27 visits    Authorization - Number of Visits 8    PT Start Time 0803    PT Stop Time 0845    PT Time Calculation (min) 42 min    Activity Tolerance Patient tolerated treatment well;No increased pain    Behavior During Therapy WFL for tasks assessed/performed             Past Medical History:  Diagnosis Date   DVT (deep venous thrombosis) (HCC)    right leg in 2011; prior injury to right foot   HLD (hyperlipidemia)    Right foot injury     No past surgical history on file.  There were no vitals filed for this visit.    Subjective Assessment - 05/27/21 1255     Subjective Patient presenting due to Lt knee pain s/p Lt knee meniscectomy and debridement. Reports 5/10 dull Lt knee pain this date.    Patient is accompained by: Family member    Pain Score 5     Pain Location Knee    Pain Orientation Left    Pain Descriptors / Indicators Dull    Pain Type Surgical pain    Pain Onset In the past 7 days                [facility] PT Assessment - 05/27/21 0001       Assessment   Medical Diagnosis Z98.890 (ICD-10-CM) - History of meniscectomy of left knee    Referring Provider (PT) Vickey Huger, MD    Onset Date/Surgical Date 05/20/21    Hand Dominance Right    Next MD Visit 5 weeks from today    Prior Therapy Yes - Cervical      Precautions   Precautions None      Restrictions   Weight Bearing Restrictions No      Balance Screen    Has the patient fallen in the past 6 months No      [place] residence    Living Arrangements Other relatives    Type of [place] Multi-level;Bed/bath upstairs    Home Equipment None      Prior Function   Level of [place] Retired      Associate Professor   Overall Cognitive Status Within Functional Limits for tasks assessed      Posture/Postural Control   Postural Limitations Weight shift right      ROM / Strength   AROM / PROM / Strength AROM;Strength      AROM   AROM Assessment Site Knee;Ankle;Hip    Left Knee Extension -6    Left Knee Flexion 103      Strength   Strength Assessment Site Hip;Knee;Ankle    Right Hip Flexion 4/5    Right Hip External Rotation  4-/5    Right Hip Internal Rotation 4+/5    Right Hip ADduction 4-/5  Left Hip External Rotation 4/5    Left Hip Internal Rotation 4/5    Left Hip ABduction 3-/5    Left Hip ADduction 4/5    Right/Left Knee Right;Left    Right Knee Flexion 4+/5    Right Knee Extension 4+/5    Left Knee Flexion 4-/5    Left Knee Extension 4-/5    Right Ankle Dorsiflexion 5/5    Right Ankle Plantar Flexion 4+/5    Left Ankle Dorsiflexion 4/5    Left Ankle Plantar Flexion 4+/5      Transfers   Transfers Sit to Stand;Stand to Sit    Sit to Stand 7: Independent    Stand to Sit 7: Independent      Balance   Balance Assessed Yes    Balance comment Romberg WFL      Static Standing Balance   Static Standing Balance -  Activities  Tandam Stance - Right Leg;Tandam Stance - Left Leg;Romberg - Eyes Opened;Romberg - Eyes Closed;Single Leg Stance - Left Leg   SLS unable; Tandem stance Lt unable; Tandem stance Rt x8 seconds                       Objective measurements completed on examination: See above findings.               PT Education - 05/27/21 1258     Education Details Access Code: [email or web address]    Person(s) Educated  Patient;Other (comment)   Brother   Methods Explanation;Tactile cues;Demonstration;Verbal cues;Handout    Comprehension Verbalized understanding;Returned demonstration;Verbal cues required;Tactile cues required              PT Short Term Goals - 05/27/21 1307       PT SHORT TERM GOAL #1   Title Patient will be independent with HEP for continued progression at home.    Time 4    Period Days    Status New    Target Date 06/24/21      PT SHORT TERM GOAL #2   Title Patient will demonstrate 4+/5 Lt knee extension and flexion strength for improved knee stability to increase activity tolerance.    Baseline 4-/5    Time 4    Period Weeks    Status New    Target Date 06/24/21      PT SHORT TERM GOAL #3   Title Patient will maintain tandem stance x20s Lt/Rt for decreased fall risk.    Baseline 8s Rt; Lt unable    Time 4    Period Weeks    Status New    Target Date 06/24/21               PT Long Term Goals - 05/27/21 1310       PT LONG TERM GOAL #1   Title Patient will be independent with advanced HEP for long term management of symptoms post D/C.    Time 8    Period Weeks    Status New    Target Date 07/22/21      PT LONG TERM GOAL #2   Title Patient will demonstrate 0 degrees Lt knee extension and 135 degrees Lt knee flexion for normalized gait pattern for improved functional mobility.    Baseline lacking 6 degrees ext; 103 degrees flexion    Time 8    Period Weeks    Status New    Target Date 07/22/21      PT LONG TERM GOAL #3  Title Patient will maintain SLS on Lt and Rt LE for at least 10s to indicate decreased fall risk.    Baseline unable Lt and Rt    Time 8    Period Weeks    Status New    Target Date 07/22/21                    Plan - 05/27/21 1258     Clinical Impression Statement Patient is a 70 y/o male referred s/p Lt knee meniscectomy. PMH includes arthritis and history of LE DVT. Patient reports difficulty with prolonged standing  and ambulation. He demonstrates Lt knee AROM impairments as patient lacking 6 degrees of knee extension. Lt LE strength impairments apparent as patient hip abduction strength 3-/5 and knee extension strength 4-/5. He exhibits balance impairments as patient unable to maintain tandem stance >10 seconds and requiring min-mod assist to attempt single limb stance indicating increased fall risk. Would benefit from skilled therapeutic intervention to address impairments for improved functional mobility and decreased fall risk.    Personal Factors and Comorbidities Age;Comorbidity 2    Comorbidities LE DVT, arthritis    Examination-Activity Limitations Locomotion Level;Stand    Examination-Participation Restrictions Community Activity    Stability/Clinical Decision Making Stable/Uncomplicated    Clinical Decision Making Low    Rehab Potential Good    PT Frequency 1x / week    PT Duration 8 weeks    PT Treatment/Interventions Electrical Stimulation;Cryotherapy;Joint Manipulations;Taping;Passive range of motion;Dry needling;Manual techniques;Patient/family education;Functional mobility training;Therapeutic activities;Therapeutic exercise;Neuromuscular re-education;Iontophoresis 4mg /ml Dexamethasone;ADLs/Self Care Home Management;Stair training;Balance training    PT Next Visit Plan review HEP; being LE strenthening with focus on quad control and VMO stabilization; static balance    PT Home Exercise Plan Access Code: [email or web address]    Consulted and Agree with Plan of Care Patient             Patient will benefit from skilled therapeutic intervention in order to improve the following deficits and impairments:  Abnormal gait, Decreased activity tolerance, Decreased balance, Decreased strength, Difficulty walking, Pain  Visit Diagnosis: Muscle weakness (generalized) - Plan: PT plan of care cert/re-cert  Cramp and spasm - Plan: PT plan of care cert/re-cert  Acute pain of left knee - Plan: PT plan of care  cert/re-cert     Problem List Patient Active Problem List   Diagnosis Date Noted   History of deep vein thrombosis (DVT) of lower extremity 05/13/2021   Degenerative cervical disc 01/16/2021   Right shoulder pain 01/16/2021   Degenerative arthritis of knee 05/28/2020   Spondylosis without myelopathy or radiculopathy, lumbar region 10/11/2016   Migraines 06/08/2016   Nuclear sclerotic cataract of both eyes 06/08/2016   Presbyopia 06/08/2016   Hypermetropia of both eyes 06/08/2016   Long term current use of anticoagulant 06/02/2016   Recurrent right inguinal hernia 12/05/2014   Chronic venous insufficiency 11/28/2014   Bradycardia 10/11/2012   Leg pain, right 10/03/2012   Leg swelling 10/03/2012   Right leg DVT ([place]) 10/03/2012   HLD (hyperlipidemia) 07/25/2012   Acute thromboembolism of deep veins of lower extremity ([place]) 12/24/2010   Lonn Georgia Genessis Flanary PT, DPT  05/27/21 1:16 PM   [facility] [address], [place], [place], [address] Phone: [phone]   Fax:  [phone]  Name: Andrew [name] MRN: [id number] Date of Birth: [birth year]

## 2021-05-27 NOTE — Patient Instructions (Signed)
Access Code: [email or web address] URL: [email or web address] Date: 05/27/2021 Prepared by: Everardo All  Exercises Supine Quad Set - 2 x daily - 7 x weekly - 2 sets - 10 reps Supine Short Arc Quad - 2 x daily - 7 x weekly - 2 sets - 10 reps Small Range Straight Leg Raise - 2 x daily - 7 x weekly - 2 sets - 10 reps Clamshell with Resistance - 2 x daily - 7 x weekly - 2 sets - 10 reps Supine Heel Slide - 2 x daily - 7 x weekly - 2 sets - 10 reps Seated Knee Flexion Extension AROM - 2 x daily - 7 x weekly - 2 sets - 10 reps Seated Long Arc Quad - 2 x daily - 7 x weekly - 2 sets - 10 reps Standing Heel Raise with Support - 2 x daily - 7 x weekly - 2 sets - 10 reps

## 2021-06-01 ENCOUNTER — Ambulatory Visit: Payer: Medicare Other | Admitting: Physical Therapy

## 2021-06-04 ENCOUNTER — Ambulatory Visit: Payer: Medicare Other | Admitting: Physical Therapy

## 2021-06-08 ENCOUNTER — Encounter: Payer: Medicaid Other | Admitting: Physical Therapy

## 2021-06-11 ENCOUNTER — Encounter: Payer: Medicaid Other | Admitting: Physical Therapy

## 2021-06-16 ENCOUNTER — Encounter: Payer: Medicaid Other | Admitting: Physical Therapy

## 2021-06-17 ENCOUNTER — Ambulatory Visit: Payer: Medicaid Other | Admitting: Specialist

## 2021-06-17 ENCOUNTER — Ambulatory Visit: Payer: Medicaid Other | Admitting: Podiatry

## 2021-06-17 ENCOUNTER — Other Ambulatory Visit: Payer: Self-pay

## 2021-06-18 ENCOUNTER — Ambulatory Visit: Payer: Medicaid Other | Admitting: Physical Therapy

## 2021-06-22 ENCOUNTER — Other Ambulatory Visit: Payer: Self-pay

## 2021-06-22 ENCOUNTER — Ambulatory Visit: Payer: Medicaid Other | Admitting: Podiatry

## 2021-06-22 DIAGNOSIS — M76821 Posterior tibial tendinitis, right leg: Secondary | ICD-10-CM

## 2021-06-22 DIAGNOSIS — M778 Other enthesopathies, not elsewhere classified: Secondary | ICD-10-CM

## 2021-06-22 MED ORDER — METHYLPREDNISOLONE 4 MG PO TBPK: ORAL_TABLET | ORAL | 0 refills | Status: DC

## 2021-06-22 MED ORDER — BETAMETHASONE SOD PHOS & ACET 6 (3-3) MG/ML IJ SUSP
3.0000 mg | Freq: Once | INTRAMUSCULAR | Status: AC
Start: 2021-06-22 — End: 2021-06-22
  Administered 2021-06-22: 3 mg via INTRA_ARTICULAR

## 2021-06-22 NOTE — Progress Notes (Signed)
   HPI: 70 y.o. male presenting today for follow-up evaluation of pain and tenderness to the right foot.  He does have a history of lumbar radiculopathy.  He experiences pain and pins-and-needles sensations to the feet.  He states that the injection helped significantly for about 2 weeks.  He has also been taking the meloxicam daily.  Past Medical History:  Diagnosis Date   DVT (deep venous thrombosis) (HCC)    right leg in 2011; prior injury to right foot   HLD (hyperlipidemia)    Right foot injury      Physical Exam: General: The patient is alert and oriented x3 in no acute distress.  Dermatology: Skin is warm, dry and supple bilateral lower extremities. Negative for open lesions or macerations.  Vascular: Palpable pedal pulses bilaterally. No edema or erythema noted. Capillary refill within normal limits.  Neurological: Epicritic and protective threshold grossly intact bilaterally.   Musculoskeletal Exam: Range of motion within normal limits to all pedal and ankle joints bilateral. Muscle strength 5/5 in all groups bilateral.  Pain on palpation overlying the Lisfranc joint right foot.  There is also some pain with palpation along the posterior tibial tendon as a traverses the medial malleolus right   Assessment: 1.  Capsulitis/DJD right TMT joint 2.  Posterior tibial tendinitis right   Plan of Care:  1. Patient evaluated. X-Rays reviewed.  2.  Injection of 0.5 cc Celestone Soluspan injection to the right Lisfranc joint around the second TMT and PT tendon sheath right just distal to the medial malleolus 3.  Prescription for Medrol Dosepak.  After completion resume meloxicam  4.  Continue wearing [place] shoes.   5.  Return to clinic in 4 weeks  *From [place].       Edrick Kins, DPM [facility]  Dr. Edrick Kins, DPM    [address], [place] [address]                Office [phone]  Fax [phone]

## 2021-06-24 ENCOUNTER — Ambulatory Visit: Payer: Medicaid Other | Admitting: Physical Therapy

## 2021-07-01 ENCOUNTER — Encounter: Payer: Medicaid Other | Admitting: Physical Therapy

## 2021-07-08 ENCOUNTER — Encounter: Payer: Medicaid Other | Admitting: Physical Therapy

## 2021-07-13 DIAGNOSIS — Z9289 Personal history of other medical treatment: Secondary | ICD-10-CM

## 2021-07-13 HISTORY — DX: Personal history of other medical treatment: Z92.89

## 2021-07-14 ENCOUNTER — Other Ambulatory Visit: Payer: Self-pay | Admitting: Podiatry

## 2021-07-14 NOTE — Progress Notes (Signed)
Cardiology Office Note:    Date:  07/15/2021   ID:  Andrew [name], DOB [birth year], MRN [id number]  PCP:  Bernerd Limbo, MD   [facility] Providers Cardiologist:  Sherren Mocha, MD      Referring MD: Bernerd Limbo, MD   Chief Complaint:  Follow-up (Hx of DVT)    Patient Profile:    Andrew [name] is a 70 y.o. male with:  Hx of DVT Chronic anticoagulation  Chronic leg edema  Hyperlipidemia   Prior CV studies: Stress Echocardiogram 04/05/18 - This is intrepreted as a negative stress echo.    There is no evidence of ischemia. Normal LV function   ETT 10/23/13 Low risk   History of Present Illness: Andrew [name] was last seen by Dr. Burt Knack in 7/21.  He returns for f/u.   He is here with his brother who helps interpret.  He had COVID-19 recently.  He has had some lower R rib pain from coughing but no exertional chest pain.  He has not had orthopnea, syncope.  His R leg swelling is overall stable and he wears a compression stocking.  He had knee surgery on the L about 6 weeks ago.  His brother notes that he was somewhat short of breath even before he had COVID-19.      Past Medical History:  Diagnosis Date   DVT (deep venous thrombosis) (HCC)    right leg in 2011; prior injury to right foot   HLD (hyperlipidemia)    Right foot injury     Current Medications: Current Meds  Medication Sig   allopurinol (ZYLOPRIM) 100 MG tablet Take 1 tablet (100 mg total) by mouth daily.   atorvastatin (LIPITOR) 40 MG tablet TAKE 1 TABLET (40 MG TOTAL) BY MOUTH DAILY AT 6 PM.   diclofenac Sodium (VOLTAREN) 1 % GEL Place onto the skin.   gabapentin (NEURONTIN) 300 MG capsule Take 1 capsule (300 mg total) by mouth at bedtime.   meloxicam (MOBIC) 15 MG tablet TAKE 1 TABLET (15 MG TOTAL) BY MOUTH DAILY.   rivaroxaban (XARELTO) 10 MG TABS tablet Take 1 tablet (10 mg total) by mouth daily.   tiZANidine (ZANAFLEX) 4 MG tablet TAKE 1 TABLET (4 MG TOTAL) BY MOUTH AS NEEDED.   traMADol (ULTRAM) 50  MG tablet Take 1 tablet (50 mg total) by mouth every 8 (eight) hours as needed.   triamcinolone ointment (KENALOG) 0.5 % APPLY TO AFFECTED AREA TWICE A DAY     Allergies:   Penicillins and Influenza vaccines   Social History   Tobacco Use   Smoking status: Former    Types: Cigarettes    Quit date: 07/25/1982    Years since quitting: 39.0   Smokeless tobacco: Never   Tobacco comments:    REMOTE SOCIAL HISTORY  Substance Use Topics   Alcohol use: No   Drug use: No     Family Hx: The patient's family history includes Heart disease in his father and mother; Vascular Disease in his maternal grandfather.  ROS   EKGs/Labs/Other Test Reviewed:    EKG:  EKG is   ordered today.  The ekg ordered today demonstrates normal sinus rhythm, HR 64, normal axis, QTc 398 ms, no ST-TW changes  Recent Labs: 03/05/2021: ALT 33   Recent Lipid Panel Lab Results  Component Value Date/Time   CHOL 167 03/05/2021 11:49 AM   TRIG 84 03/05/2021 11:49 AM   HDL 56 03/05/2021 11:49 AM   LDLCALC 95 03/05/2021 11:49 AM  Risk Assessment/Calculations:      Physical Exam:    VS:  BP 102/60   Pulse 64   Ht '5\' 9"'$  ([id number] m)   Wt 199 lb 3.2 oz (90.4 kg)   SpO2 97%   BMI 29.42 kg/m     Wt Readings from Last 3 Encounters:  07/15/21 199 lb 3.2 oz (90.4 kg)  04/16/21 195 lb (88.5 kg)  03/05/21 195 lb (88.5 kg)     Constitutional:      Appearance: Healthy appearance. Not in distress.  Neck:     Vascular: JVD normal.  Pulmonary:     Effort: Pulmonary effort is normal.     Breath sounds: No wheezing. No rales.  Cardiovascular:     Normal rate. Regular rhythm. Normal S1. Normal S2.      Murmurs: There is no murmur.  Edema:    Peripheral edema present.    Pretibial: 1+ edema of the right pretibial area.    Ankle: trace edema of the left ankle. Abdominal:     Palpations: Abdomen is soft.  Skin:    General: Skin is warm and dry.  Neurological:     General: No focal deficit present.      Mental Status: Alert and oriented to person, place and time.     Cranial Nerves: Cranial nerves are intact.         ASSESSMENT & PLAN:    1. History of DVT (deep vein thrombosis) He remains on chronic anticoagulation with Rivaroxaban.  He is tolerating this without bleeding issues.  Continue current regimen.  Obtain f/u BMET, CBC today.  F/u in 1 year.   2. Mixed hyperlipidemia LDL optimal on most recent lab work.  Continue current dose of Atorvastatin.    3. Shortness of breath He notes recent symptoms of shortness of breath.  Question if this may be related to increased workload from his knee issues/recent knee surgery plus recent COVID-19 illness.  Exam does not suggest volume excess.  EKG is normal.  I will obtain an NT-Pro BNP and an echocardiogram.  He knows to return for sooner f/u if his symptoms do not improve or if they worsen.           Dispo:  Return in about 1 year (around 07/15/2022) for Routine Follow Up, w/ Dr. Burt Knack, or Richardson Dopp, PA-C.   Medication Adjustments/Labs and Tests Ordered: Current medicines are reviewed at length with the patient today.  Concerns regarding medicines are outlined above.  Tests Ordered: Orders Placed This Encounter  Procedures   Basic Metabolic Panel (BMET)   Pro b natriuretic peptide   CBC   EKG 12-Lead   ECHOCARDIOGRAM COMPLETE   Medication Changes: No orders of the defined types were placed in this encounter.   Signed, Richardson Dopp, PA-C  07/15/2021 1:04 PM    [facility] [place], [place], [place]  [address] Phone: [phone]; Fax: [phone]

## 2021-07-15 ENCOUNTER — Other Ambulatory Visit: Payer: Self-pay

## 2021-07-15 ENCOUNTER — Ambulatory Visit: Payer: Medicaid Other | Admitting: Physician Assistant

## 2021-07-15 ENCOUNTER — Encounter: Payer: Self-pay | Admitting: Physician Assistant

## 2021-07-15 VITALS — BP 102/60 | HR 64 | Ht 69.0 in | Wt 199.2 lb

## 2021-07-15 DIAGNOSIS — Z86718 Personal history of other venous thrombosis and embolism: Secondary | ICD-10-CM

## 2021-07-15 DIAGNOSIS — R0602 Shortness of breath: Secondary | ICD-10-CM

## 2021-07-15 DIAGNOSIS — E782 Mixed hyperlipidemia: Secondary | ICD-10-CM

## 2021-07-15 NOTE — Patient Instructions (Signed)
Medication Instructions:   Your physician recommends that you continue on your current medications as directed. Please refer to the Current Medication list given to you today.  *If you need a refill on your cardiac medications before your next appointment, please call your pharmacy*   Lab Work: TODAY!!!!  BMET/PRO BNP/CBC If you have labs (blood work) drawn today and your tests are completely normal, you will receive your results only by: [place] (if you have MyChart) OR A paper copy in the mail If you have any lab test that is abnormal or we need to change your treatment, we will call you to review the results.   Testing/Procedures: Your physician has requested that you have an echocardiogram.Friday, August 19 @ 10:10 am.  Echocardiography is a painless test that uses sound waves to create images of your heart. It provides your doctor with information about the size and shape of your heart and how well your heart's chambers and valves are working. This procedure takes approximately one hour. There are no restrictions for this procedure.    Follow-Up: At [facility], you and your health needs are our priority.  As part of our continuing mission to provide you with exceptional heart care, we have created designated Provider Care Teams.  These Care Teams include your primary Cardiologist (physician) and Advanced Practice Providers (APPs -  Physician Assistants and Nurse Practitioners) who all work together to provide you with the care you need, when you need it.  We recommend signing up for the patient portal called "MyChart".  Sign up information is provided on this After Visit Summary.  MyChart is used to connect with patients for Virtual Visits (Telemedicine).  Patients are able to view lab/test results, encounter notes, upcoming appointments, etc.  Non-urgent messages can be sent to your provider as well.   To learn more about what you can do with MyChart, go to  [email or web address].    Your next appointment:   1 year(s)  The format for your next appointment:   In Person  Provider:   You may see Sherren Mocha, MD or one of the following Advanced Practice Providers on your designated Care Team:   Richardson Dopp, PA-C  Other Instructions Your physician wants you to follow-up in: 1 year with Dr. Burt Knack or Richardson Dopp, PA-C.  You will receive a reminder letter in the mail two months in advance. If you don't receive a letter, please call our office to schedule the follow-up appointment.

## 2021-07-16 LAB — CBC
Hematocrit: 41.2 % (ref 37.5–51.0)
Hemoglobin: 13.6 g/dL (ref 13.0–17.7)
MCH: 29.6 pg (ref 26.6–33.0)
MCHC: 33 g/dL (ref 31.5–35.7)
MCV: 90 fL (ref 79–97)
Platelets: 217 10*3/uL (ref 150–450)
RBC: 4.59 x10E6/uL (ref 4.14–5.80)
RDW: 12.6 % (ref 11.6–15.4)
WBC: 6.6 10*3/uL (ref 3.4–10.8)

## 2021-07-16 LAB — BASIC METABOLIC PANEL
BUN/Creatinine Ratio: 18 (ref 10–24)
BUN: 17 mg/dL (ref 8–27)
CO2: 23 mmol/L (ref 20–29)
Calcium: 9 mg/dL (ref 8.6–10.2)
Chloride: 102 mmol/L (ref 96–106)
Creatinine, Ser: 0.95 mg/dL (ref 0.76–1.27)
Glucose: 85 mg/dL (ref 65–99)
Potassium: 4.2 mmol/L (ref 3.5–5.2)
Sodium: 140 mmol/L (ref 134–144)
eGFR: 86 mL/min/{1.73_m2} (ref >59)

## 2021-07-16 LAB — PRO B NATRIURETIC PEPTIDE: NT-Pro BNP: 140 pg/mL (ref 0–376)

## 2021-07-22 ENCOUNTER — Ambulatory Visit (INDEPENDENT_AMBULATORY_CARE_PROVIDER_SITE_OTHER): Payer: Medicare Other | Admitting: Specialist

## 2021-07-22 ENCOUNTER — Encounter: Payer: Self-pay | Admitting: Specialist

## 2021-07-22 ENCOUNTER — Other Ambulatory Visit: Payer: Self-pay

## 2021-07-22 VITALS — BP 94/64 | HR 62 | Ht 70.0 in | Wt 195.4 lb

## 2021-07-22 DIAGNOSIS — M47816 Spondylosis without myelopathy or radiculopathy, lumbar region: Secondary | ICD-10-CM

## 2021-07-22 DIAGNOSIS — M4722 Other spondylosis with radiculopathy, cervical region: Secondary | ICD-10-CM | POA: Diagnosis not present

## 2021-07-22 DIAGNOSIS — M4726 Other spondylosis with radiculopathy, lumbar region: Secondary | ICD-10-CM

## 2021-07-22 DIAGNOSIS — M5412 Radiculopathy, cervical region: Secondary | ICD-10-CM | POA: Diagnosis not present

## 2021-07-22 DIAGNOSIS — M542 Cervicalgia: Secondary | ICD-10-CM

## 2021-07-22 MED ORDER — GABAPENTIN 300 MG PO CAPS: 300.0000 mg | ORAL_CAPSULE | Freq: Every day | ORAL | 3 refills | Status: DC

## 2021-07-22 NOTE — Patient Instructions (Addendum)
  Plan: Avoid bending, stooping and avoid lifting weights greater than 10 lbs. Avoid prolong standing and walking. Avoid frequent bending and stooping  No lifting greater than 10 lbs. May use ice or moist heat for pain. Weight loss is of benefit. Handicap license is approved. Dr. Romona Curls secretary/Assistant will call to arrange for right leg EMG and NCV, Assess for tarsal tunnel vs Lumbar radiculopathy, L4, L5 or S1. Avoid overhead lifting and overhead use of the arms. Do not lift greater than 5 lbs. Adjust head rest in vehicle to prevent hyperextension if rear ended. Take extra precautions to avoid falling, including use of a cane if you feel weak. Referred to Dr. Ernestina Patches for EMGs and NCV of the right leg.  Follow-Up Instructions: No follow-ups on file.

## 2021-07-22 NOTE — Progress Notes (Signed)
Office Visit Note   Patient: Andrew [name]           Date of Birth: 1951-06-17           MRN: [id number] Visit Date: 07/22/2021              Requested by: Bernerd Limbo, MD [address] [place] [place],  [place] [id number] PCP: Bernerd Limbo, MD   Assessment & Plan: Visit Diagnoses:  1. Other spondylosis with radiculopathy, cervical region   2. Spondylosis without myelopathy or radiculopathy, lumbar region   3. Radiculopathy, cervical region   4. Other spondylosis with radiculopathy, lumbar region   5. Cervicalgia     Plan: Avoid bending, stooping and avoid lifting weights greater than 10 lbs. Avoid prolong standing and walking. Avoid frequent bending and stooping  No lifting greater than 10 lbs. May use ice or moist heat for pain. Weight loss is of benefit. Handicap license is approved. Dr. Romona Curls secretary/Assistant will call to arrange for right leg EMG and NCV, Assess for tarsal tunnel vs Lumbar radiculopathy, L4, L5 or S1. Avoid overhead lifting and overhead use of the arms. Do not lift greater than 5 lbs. Adjust head rest in vehicle to prevent hyperextension if rear ended. Take extra precautions to avoid falling, including use of a cane if you feel weak. Referred to Dr. Ernestina Patches for EMGs and NCV of the right leg.  Follow-Up Instructions: No follow-ups on file.   Orders:  No orders of the defined types were placed in this encounter.  No orders of the defined types were placed in this encounter.     Procedures: No procedures performed   Clinical Data: No additional findings.   Subjective: Chief Complaint  Patient presents with   Neck - Follow-up    Doing ok   Lower Back - Follow-up    Doing ok    70 year old male with history of right foot numbness and some changes in the right foot with numbness and podiatry.   Review of Systems  Constitutional: Negative.   HENT: Negative.    Eyes: Negative.   Respiratory: Negative.     Cardiovascular: Negative.   Gastrointestinal: Negative.   Endocrine: Negative.   Genitourinary: Negative.   Musculoskeletal: Negative.   Skin: Negative.   Allergic/Immunologic: Negative.   Neurological: Negative.   Hematological: Negative.   Psychiatric/Behavioral: Negative.      Objective: Vital Signs: BP 94/64 (BP Location: Left Arm, Patient Position: Sitting)   Pulse 62   Ht '5\' 10"'$  (1.778 m)   Wt 195 lb 6.4 oz (88.6 kg)   BMI 28.04 kg/m   Physical Exam Constitutional:      Appearance: He is well-developed.  HENT:     Head: Normocephalic and atraumatic.  Eyes:     Pupils: Pupils are equal, round, and reactive to light.  Pulmonary:     Effort: Pulmonary effort is normal.     Breath sounds: Normal breath sounds.  Abdominal:     General: Bowel sounds are normal.     Palpations: Abdomen is soft.  Musculoskeletal:     Cervical back: Normal range of motion and neck supple.     Lumbar back: Negative right straight leg raise test and negative left straight leg raise test.  Skin:    General: Skin is warm and dry.  Neurological:     Mental Status: He is alert and oriented to person, place, and time.  Psychiatric:  Behavior: Behavior normal.        Thought Content: Thought content normal.        Judgment: Judgment normal.   Back Exam   Tenderness  The patient is experiencing tenderness in the lumbar.  Range of Motion  Extension:  abnormal  Flexion:  abnormal  Lateral bend right:  abnormal  Lateral bend left:  abnormal  Rotation right:  abnormal  Rotation left:  abnormal   Muscle Strength  Right Quadriceps:  5/5  Left Quadriceps:  5/5  Right Hamstrings:  5/5  Left Hamstrings:  5/5   Tests  Straight leg raise right: negative Straight leg raise left: negative  Other  Toe walk: normal Heel walk: normal Sensation: decreased Erythema: no back redness Scars: absent  Comments:  Numbness right foot all the toes    Specialty Comments:  No specialty  comments available.  Imaging: No results found.   PMFS History: Patient Active Problem List   Diagnosis Date Noted   History of deep vein thrombosis (DVT) of lower extremity 05/13/2021   Degenerative cervical disc 01/16/2021   Right shoulder pain 01/16/2021   Degenerative arthritis of knee 05/28/2020   Spondylosis without myelopathy or radiculopathy, lumbar region 10/11/2016   Migraines 06/08/2016   Nuclear sclerotic cataract of both eyes 06/08/2016   Presbyopia 06/08/2016   Hypermetropia of both eyes 06/08/2016   Long term current use of anticoagulant 06/02/2016   Recurrent right inguinal hernia 12/05/2014   Chronic venous insufficiency 11/28/2014   Bradycardia 10/11/2012   Leg pain, right 10/03/2012   Leg swelling 10/03/2012   Right leg DVT ([place]) 10/03/2012   HLD (hyperlipidemia) 07/25/2012   Acute thromboembolism of deep veins of lower extremity ([place]) 12/24/2010   Past Medical History:  Diagnosis Date   DVT (deep venous thrombosis) (HCC)    right leg in 2011; prior injury to right foot   HLD (hyperlipidemia)    Right foot injury     Family History  Problem Relation Age of Onset   Heart disease Mother    Heart disease Father    Vascular Disease Maternal Grandfather     History reviewed. No pertinent surgical history. Social History   Occupational History   Not on file  Tobacco Use   Smoking status: Former    Types: Cigarettes    Quit date: 07/25/1982    Years since quitting: 39.0   Smokeless tobacco: Never   Tobacco comments:    REMOTE SOCIAL HISTORY  Substance and Sexual Activity   Alcohol use: No   Drug use: No   Sexual activity: Not Currently

## 2021-07-31 ENCOUNTER — Ambulatory Visit (HOSPITAL_COMMUNITY): Payer: Medicare Other | Attending: Internal Medicine

## 2021-07-31 ENCOUNTER — Other Ambulatory Visit: Payer: Self-pay

## 2021-07-31 DIAGNOSIS — R0602 Shortness of breath: Secondary | ICD-10-CM

## 2021-07-31 DIAGNOSIS — Z86718 Personal history of other venous thrombosis and embolism: Secondary | ICD-10-CM

## 2021-07-31 DIAGNOSIS — R0600 Dyspnea, unspecified: Secondary | ICD-10-CM | POA: Diagnosis not present

## 2021-07-31 DIAGNOSIS — E782 Mixed hyperlipidemia: Secondary | ICD-10-CM

## 2021-07-31 DIAGNOSIS — I517 Cardiomegaly: Secondary | ICD-10-CM | POA: Insufficient documentation

## 2021-07-31 LAB — ECHOCARDIOGRAM COMPLETE
Area-P 1/2: 3.06 cm2
Calc EF: 63.4 %
S' Lateral: 3.1 cm
Single Plane A2C EF: 57.6 %
Single Plane A4C EF: 66.6 %

## 2021-08-03 ENCOUNTER — Encounter: Payer: Self-pay | Admitting: Physician Assistant

## 2021-08-05 ENCOUNTER — Other Ambulatory Visit: Payer: Self-pay

## 2021-08-05 ENCOUNTER — Ambulatory Visit (INDEPENDENT_AMBULATORY_CARE_PROVIDER_SITE_OTHER): Payer: Medicare Other

## 2021-08-05 ENCOUNTER — Encounter: Payer: Self-pay | Admitting: Pulmonary Disease

## 2021-08-05 ENCOUNTER — Ambulatory Visit: Payer: Medicare Other | Admitting: Pulmonary Disease

## 2021-08-05 VITALS — BP 122/80 | HR 65 | Temp 98.0°F | Ht 70.0 in | Wt 195.6 lb

## 2021-08-05 DIAGNOSIS — R0781 Pleurodynia: Secondary | ICD-10-CM | POA: Diagnosis not present

## 2021-08-05 DIAGNOSIS — Z8616 Personal history of COVID-19: Secondary | ICD-10-CM

## 2021-08-05 DIAGNOSIS — R0602 Shortness of breath: Secondary | ICD-10-CM | POA: Diagnosis not present

## 2021-08-05 MED ORDER — ADVAIR HFA 115-21 MCG/ACT IN AERO: 2.0000 | INHALATION_SPRAY | Freq: Two times a day (BID) | RESPIRATORY_TRACT | 2 refills | Status: DC

## 2021-08-05 NOTE — Progress Notes (Signed)
Subjective:   PATIENT ID: Andrew [name] GENDER: male DOB: 09-05-1951, MRN: [id number]   HPI  Chief Complaint  Patient presents with   Consult    Pt states coughing, 71month ago had covid left side rib cage pain on scale rate 6.     Reason for Visit: New consult for shortness of breath  Andrew [name] a 70year old male with history of DVT, chronic leg swelling and HLD who presents for new consult for shortness of breath. His brother is presents to translate and provide additional history.  He was diagnosed with COVID-19 two months ago. He continues to have persistent cough. Reports left rib cage pain.  Worsens when he coughs and lays on the left side. He reports that he is still coughing at night. Oxygen saturations will go to 90%.   He reports cough is occasionally productive but mainly dry cough. He does have wheezing when laying down at night. Nebulizers helped significantly. Duonebs.   Denies chronic respiratory illnesses.   Social History: <3 years. Social smoker.  I have personally reviewed patient's past medical/family/social history, allergies, current medications.  Past Medical History:  Diagnosis Date   DVT (deep venous thrombosis) (HCC)    right leg in 2011; prior injury to right foot   Echocardiogram 07/2021   Echocardiogram 8/22: EF 60-65, no RWMA, mild LVH, low normal RVSF, trivial AI, mild AV sclerosis w/o AS   HLD (hyperlipidemia)    Right foot injury      Family History  Problem Relation Age of Onset   Heart disease Mother    Heart disease Father    Vascular Disease Maternal Grandfather      Social History   Occupational History   Not on file  Tobacco Use   Smoking status: Former    Types: Cigarettes    Quit date: 07/25/1982    Years since quitting: 39.0   Smokeless tobacco: Never   Tobacco comments:    REMOTE SOCIAL HISTORY  Substance and Sexual Activity   Alcohol use: No   Drug use: No   Sexual activity: Not Currently     Allergies  Allergen Reactions   Penicillins Anaphylaxis   Influenza Vaccines     Broke out in hives and had breathing problems     Outpatient Medications Prior to Visit  Medication Sig Dispense Refill   allopurinol (ZYLOPRIM) 100 MG tablet Take 1 tablet (100 mg total) by mouth daily. 60 tablet 3   atorvastatin (LIPITOR) 40 MG tablet TAKE 1 TABLET (40 MG TOTAL) BY MOUTH DAILY AT 6 PM. 90 tablet 3   diclofenac Sodium (VOLTAREN) 1 % GEL Place onto the skin.     gabapentin (NEURONTIN) 300 MG capsule Take 1 capsule (300 mg total) by mouth at bedtime. 90 capsule 3   meloxicam (MOBIC) 15 MG tablet TAKE 1 TABLET (15 MG TOTAL) BY MOUTH DAILY. 30 tablet 1   rivaroxaban (XARELTO) 10 MG TABS tablet Take 1 tablet (10 mg total) by mouth daily. 90 tablet 3   tiZANidine (ZANAFLEX) 4 MG tablet TAKE 1 TABLET (4 MG TOTAL) BY MOUTH AS NEEDED. 30 tablet 1   traMADol (ULTRAM) 50 MG tablet Take 1 tablet (50 mg total) by mouth every 8 (eight) hours as needed. 40 tablet 0   triamcinolone ointment (KENALOG) 0.5 % APPLY TO AFFECTED AREA TWICE A DAY     No facility-administered medications prior to visit.    Review of Systems  Constitutional:  Negative for chills,  diaphoresis, fever, malaise/fatigue and weight loss.  HENT:  Negative for congestion, ear pain and sore throat.   Respiratory:  Positive for cough, sputum production, shortness of breath and wheezing. Negative for hemoptysis.   Cardiovascular:  Negative for chest pain, palpitations and leg swelling.  Gastrointestinal:  Negative for abdominal pain, heartburn and nausea.  Genitourinary:  Negative for frequency.  Musculoskeletal:  Negative for joint pain and myalgias.       Right-sided rib pain  Skin:  Negative for itching and rash.  Neurological:  Negative for dizziness, weakness and headaches.  Endo/Heme/Allergies:  Does not bruise/bleed easily.  Psychiatric/Behavioral:  Negative for depression. The patient is not nervous/anxious.      Objective:   Vitals:   08/05/21 1411  BP: 122/80  Pulse: 65  Temp: 98 F (36.7 C)  TempSrc: Oral  SpO2: 97%  Weight: 195 lb 9.6 oz (88.7 kg)  Height: '5\' 10"'$  (1.778 m)      Physical Exam: General: Well-appearing, no acute distress HENT: [place], AT Eyes: EOMI, no scleral icterus Respiratory: Clear to auscultation bilaterally.  No crackles, wheezing or rales Cardiovascular: RRR, -M/R/G, no JVD Extremities: Mild nonpitting right lower extremity edema,-tenderness Neuro: AAO x4, CNII-XII grossly intact Psych: Normal mood, normal affect   Data Reviewed:  Imaging: CXR 12/12/14 - No infiltrate, edema, effusion CXR 08/05/2021 bibasilar atelectasis.  No rib fracture present  PFT: None on file  Echocardiogream: EF 60-65%. Mild LVH  Labs: CBC    Component Value Date/Time   WBC 6.6 07/15/2021 1039   WBC 8.1 04/28/2016 1113   RBC 4.59 07/15/2021 1039   RBC 4.70 04/28/2016 1113   HGB 13.6 07/15/2021 1039   HCT 41.2 07/15/2021 1039   PLT 217 07/15/2021 1039   MCV 90 07/15/2021 1039   MCH 29.6 07/15/2021 1039   MCH 29.8 04/28/2016 1113   MCHC 33.0 07/15/2021 1039   MCHC 32.1 04/28/2016 1113   RDW 12.6 07/15/2021 1039   LYMPHSABS 1.2 08/11/2013 1102   MONOABS 0.5 08/11/2013 1102   EOSABS 0.1 08/11/2013 1102   BASOSABS 0.0 08/11/2013 1102   Absolute eos 08/11/13 - 100     Assessment & Plan:   Discussion: 70 year old [place] speaking male with history of DVT, chronic leg swelling and HLD who presents with shortness of breath.  He recently had with COVID-19 nfection with improving but persistent symptoms including shortness of breath also reports chest pain that was associated with strong coughing spell. No prior respiratory conditions. His shortness of breath is likely sequelae recent viral infection. Low suspicion for VTE in setting of chronic anticoagulation. Counseled that this can take 3 to 6 months to improve in some individuals.  With his wheezing he may benefit  from steroid bronchodilator.  If his symptoms are still present at the next visit this may be his new baseline.  Recent COVID-19 Shortness of breath Right rib pain --START Advair 115-21 mcg TWO puffs TWICE a day --Ok to use nebulizer as needed  --CXR to rule out rib fracture  Health Maintenance Immunization History  Administered Date(s) Administered   Influenza Split 12/13/2002   PFIZER(Purple Top)SARS-COV-2 Vaccination 02/14/2020, 03/11/2020, 09/26/2020   PPD Test 12/13/2010   Pneumococcal Conjugate-13 12/15/2017   Pneumococcal Polysaccharide-23 07/10/2019   Tdap 12/13/2008   CT Lung Screen-not qualified.  Never smoker  Orders Placed This Encounter  Procedures   DG Chest 2 View    Standing Status:   Future    Number of Occurrences:   1  Standing Expiration Date:   08/05/2022    Order Specific Question:   Reason for Exam (SYMPTOM  OR DIAGNOSIS REQUIRED)    Answer:   rib pain, cough    Order Specific Question:   Preferred imaging location?    Answer:   Internal   Meds ordered this encounter  Medications   fluticasone-salmeterol (ADVAIR HFA) 115-21 MCG/ACT inhaler    Sig: Inhale 2 puffs into the lungs 2 (two) times daily.    Dispense:  1 each    Refill:  2    No follow-ups on file.  I have spent a total time of 45-minutes on the day of the appointment reviewing prior documentation, coordinating care and discussing medical diagnosis and plan with the patient/family. Imaging, labs and tests included in this note have been reviewed and interpreted independently by me.  [place], MD [facility] 08/05/2021 2:03 PM  Office Number [phone]

## 2021-08-05 NOTE — Patient Instructions (Addendum)
Recent COVID-19 Shortness of breath Right rib pain --START Advair 115-21 mcg TWO puffs TWICE a day --Ok to use nebulizer as needed  --CXR to rule out rib fracture  Follow-up with me in 3 months

## 2021-08-20 ENCOUNTER — Telehealth: Payer: Self-pay | Admitting: Physical Medicine and Rehabilitation

## 2021-08-20 ENCOUNTER — Other Ambulatory Visit: Payer: Self-pay | Admitting: Radiology

## 2021-08-20 DIAGNOSIS — M4722 Other spondylosis with radiculopathy, cervical region: Secondary | ICD-10-CM

## 2021-08-20 DIAGNOSIS — M47816 Spondylosis without myelopathy or radiculopathy, lumbar region: Secondary | ICD-10-CM

## 2021-08-20 NOTE — Telephone Encounter (Signed)
Was noted in last O note, I placed the order

## 2021-08-20 NOTE — Telephone Encounter (Signed)
Patient was last seen in August by Dr. Louanne Skye. I do not see a referral for NCV in his chart. Please advise.

## 2021-08-20 NOTE — Telephone Encounter (Signed)
Patient's relative called. Patient was suppose to have a nerve study done and never did. Would like a call back concerning the study. Her call back number is [phone]

## 2021-08-26 ENCOUNTER — Encounter: Payer: Self-pay | Admitting: Physical Medicine and Rehabilitation

## 2021-08-26 ENCOUNTER — Ambulatory Visit (INDEPENDENT_AMBULATORY_CARE_PROVIDER_SITE_OTHER): Payer: Medicare Other | Admitting: Physical Medicine and Rehabilitation

## 2021-08-26 ENCOUNTER — Other Ambulatory Visit: Payer: Self-pay

## 2021-08-26 DIAGNOSIS — R202 Paresthesia of skin: Secondary | ICD-10-CM | POA: Diagnosis not present

## 2021-08-26 NOTE — Progress Notes (Signed)
Numbness in toes on right. All toes are numb; about half of the right foot is numb.

## 2021-08-27 ENCOUNTER — Ambulatory Visit (INDEPENDENT_AMBULATORY_CARE_PROVIDER_SITE_OTHER): Payer: Medicare Other | Admitting: Specialist

## 2021-08-27 ENCOUNTER — Encounter: Payer: Self-pay | Admitting: Specialist

## 2021-08-27 VITALS — BP 135/74 | HR 67 | Ht 70.0 in | Wt 196.0 lb

## 2021-08-27 DIAGNOSIS — M5412 Radiculopathy, cervical region: Secondary | ICD-10-CM

## 2021-08-27 DIAGNOSIS — I8001 Phlebitis and thrombophlebitis of superficial vessels of right lower extremity: Secondary | ICD-10-CM | POA: Diagnosis not present

## 2021-08-27 DIAGNOSIS — M4722 Other spondylosis with radiculopathy, cervical region: Secondary | ICD-10-CM | POA: Diagnosis not present

## 2021-08-27 DIAGNOSIS — M542 Cervicalgia: Secondary | ICD-10-CM | POA: Diagnosis not present

## 2021-08-27 DIAGNOSIS — M1712 Unilateral primary osteoarthritis, left knee: Secondary | ICD-10-CM

## 2021-08-27 NOTE — Procedures (Signed)
EMG & NCV Findings: Evaluation of the right fibular motor and the right tibial motor nerves showed reduced amplitude (R0.3, R0.6 mV).  The right superficial fibular sensory nerve showed no response (14 cm).  All remaining nerves (as indicated in the following tables) were within normal limits.    All examined muscles (as indicated in the following table) showed no evidence of electrical instability.    Impression: Essentially NORMAL electrodiagnostic study of the right lower limb.  The decreased amplitudes are technical artifact and are due to significant edema of the lower leg do to deep vein thrombosis history.  There is no significant electrodiagnostic evidence of nerve entrapment, lumbosacral plexopathy or lumbar radiculopathy.  As you know, purely sensory or demyelinating radiculopathies and chemical radiculitis may not be detected with this particular electrodiagnostic study.  Recommendations: 1.  Follow-up with referring physician. 2.  Continue current management of symptoms.  ___________________________ Laurence Spates FAAPMR Board Certified, American Board of Physical Medicine and Rehabilitation    Nerve Conduction Studies Anti Sensory Summary Table   Stim Site NR Peak (ms) Norm Peak (ms) P-T Amp (V) Norm P-T Amp Site1 Site2 Delta-P (ms) Dist (cm) Vel (m/s) Norm Vel (m/s)  Right Sup Fibular Anti Sensory (Ant Lat Mall)  30.5C  14 cm *NR  <4.4  >5.0 14 cm Ant Lat Mall  14.0  >32  Right Sural Anti Sensory (Lat Mall)  30.3C  Calf    3.8 <4.0 7.1 >5.0 Calf Lat Mall 3.8 14.0 37 >35  Site 2    4.0  4.9          Motor Summary Table   Stim Site NR Onset (ms) Norm Onset (ms) O-P Amp (mV) Norm O-P Amp Site1 Site2 Delta-0 (ms) Dist (cm) Vel (m/s) Norm Vel (m/s)  Right Fibular Motor (Ext Dig Brev)  30.2C  Ankle    4.2 <6.1 *0.3 >2.5 B Fib Ankle 6.4 32.0 50 >38  B Fib    10.6  1.0  Poplt B Fib 2.1 9.0 43 >40  Poplt    12.7  1.4         Right Tibial Motor (Abd Hall Brev)  30.4C  Ankle     4.8 <6.1 *0.6 >3.0 Knee Ankle 8.2 38.0 46 >35  Knee    13.0  2.0          EMG   Side Muscle Nerve Root Ins Act Fibs Psw Amp Dur Poly Recrt Int Fraser Din Comment  Right AntTibialis Dp Br Peron L4-5 Nml Nml Nml Nml Nml 0 Nml Nml   Right Fibularis Longus  Sup Br Peron L5-S1 Nml Nml Nml Nml Nml 0 Nml Nml   Right MedGastroc Tibial S1-2 Nml Nml Nml Nml Nml 0 Nml Nml   Right VastusMed Femoral L2-4 Nml Nml Nml Nml Nml 0 Nml Nml   Right BicepsFemS Sciatic L5-S1 Nml Nml Nml Nml Nml 0 Nml Nml   Right GluteusMed SupGluteal L4-S1 Nml Nml Nml Nml Nml 0 Nml Nml     Nerve Conduction Studies Anti Sensory Left/Right Comparison   Stim Site L Lat (ms) R Lat (ms) L-R Lat (ms) L Amp (V) R Amp (V) L-R Amp (%) Site1 Site2 L Vel (m/s) R Vel (m/s) L-R Vel (m/s)  Sup Fibular Anti Sensory (Ant Lat Mall)  30.5C  14 cm       14 cm Ant Lat Mall     Sural Anti Sensory (Lat Mall)  30.3C  Calf  3.8   7.1  Calf Lat  Mall  37   Site 2  4.0   4.9         Motor Left/Right Comparison   Stim Site L Lat (ms) R Lat (ms) L-R Lat (ms) L Amp (mV) R Amp (mV) L-R Amp (%) Site1 Site2 L Vel (m/s) R Vel (m/s) L-R Vel (m/s)  Fibular Motor (Ext Dig Brev)  30.2C  Ankle  4.2   *0.3  B Fib Ankle  50   B Fib  10.6   1.0  Poplt B Fib  43   Poplt  12.7   1.4        Tibial Motor (Abd Hall Brev)  30.4C  Ankle  4.8   *0.6  Knee Ankle  46   Knee  13.0   2.0           Waveforms:

## 2021-08-27 NOTE — Patient Instructions (Signed)
Avoid bending, stooping and avoid lifting weights greater than 10 lbs. Avoid prolong standing and walking. Avoid frequent bending and stooping  No lifting greater than 10 lbs. May use ice or moist heat for pain. Weight loss is of benefit. Handicap license is approved.  Avoid overhead lifting and overhead use of the arms. Do not lift greater than 5 lbs. Adjust head rest in vehicle to prevent hyperextension if rear ended. Take extra precautions to avoid falling, including use of a cane if you feel weak. Knee is suffering from osteoarthritis, only real proven treatments are Weight loss, transdrmal diclofenac gel and exercise. Well padded shoes help. Ice the knee that is suffering from osteoarthritis, only real proven treatments are Ice the knee 2-3 times a day 15-20 mins at a time.-3 times a day 15-20 mins at a time. Hot showers in the AM.  Injection with steroid may be of benefit. Hemp CBD capsules, amazon.com 5,000-7,000 mg per bottle, 60 capsules per bottle, take one capsule twice a day. Cane in the left hand to use with left leg weight bearing. May call Dr. Ronnie Derby and enquire as to whether a rooster comb type Hyluronate injection may be of benefit. Referral to PT here at [facility] 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  Follow-Up Instructions: No follow-ups on file.

## 2021-08-27 NOTE — Progress Notes (Signed)
Tenor Casa - 70 y.o. male MRN [id number]  Date of birth: Oct 22, 1951  Office Visit Note: Visit Date: 08/26/2021 PCP: Bernerd Limbo, MD Referred by: Bernerd Limbo, MD  Subjective: Chief Complaint  Patient presents with   Right Foot - Numbness   HPI:  Andrew [name] is a 70 y.o. male who comes in today At the request of Dr. Basil Dess for electrodiagnostic study of the right lower extremity.  Dr. Otho Ket concern is mainly for L4, L5 or S1 radiculopathy or tarsal tunnel syndrome.  His brother is present who provides interpretation and translation.  Patient is [place].  I have seen him in the past for epidural injection.  His main complaint is numbness and pins-and-needles essentially globally in the right foot.  He reports all of his toes and about half of the right foot is numb.  He has a history of some type of foot injury but he also has a history of deep vein thrombosis of the right leg I believe as far back as 2011.  He has had stable chronic lower extremity right-sided edema.  This is been followed extensively and I reviewed those notes.  He has been seeing a podiatrist who diagnosed him with right foot capsulitis.  Last lumbar MRI from 2019 shows moderate stenosis.  This was at L4-5.  He does not have prior electrodiagnostic study for review.  Unfortunately tarsal tunnel compression is very hard to diagnose with electrodiagnostic studies and probably almost impossible in this patient with extensive edema.  ROS Otherwise per HPI.  Assessment & Plan: Visit Diagnoses:    ICD-10-CM   1. Paresthesia of skin  R20.2 NCV with EMG (electromyography)      Plan: Impression: Essentially NORMAL electrodiagnostic study of the right lower limb.  The decreased amplitudes are technical artifact and are due to significant edema of the lower leg do to deep vein thrombosis history.  There is no significant electrodiagnostic evidence of nerve entrapment, lumbosacral plexopathy or lumbar radiculopathy.   As you know, purely sensory or demyelinating radiculopathies and chemical radiculitis may not be detected with this particular electrodiagnostic study.  Recommendations: 1.  Follow-up with referring physician. 2.  Continue current management of symptoms.  Meds & Orders: No orders of the defined types were placed in this encounter.   Orders Placed This Encounter  Procedures   NCV with EMG (electromyography)    Follow-up: Return for Basil Dess, MD as scheduled.   Procedures: No procedures performed  EMG & NCV Findings: Evaluation of the right fibular motor and the right tibial motor nerves showed reduced amplitude (R0.3, R0.6 mV).  The right superficial fibular sensory nerve showed no response (14 cm).  All remaining nerves (as indicated in the following tables) were within normal limits.    All examined muscles (as indicated in the following table) showed no evidence of electrical instability.    Impression: Essentially NORMAL electrodiagnostic study of the right lower limb.  The decreased amplitudes are technical artifact and are due to significant edema of the lower leg do to deep vein thrombosis history.  There is no significant electrodiagnostic evidence of nerve entrapment, lumbosacral plexopathy or lumbar radiculopathy.  As you know, purely sensory or demyelinating radiculopathies and chemical radiculitis may not be detected with this particular electrodiagnostic study.  Recommendations: 1.  Follow-up with referring physician. 2.  Continue current management of symptoms.  ___________________________ [employer] Board Certified, American Board of Physical Medicine and Rehabilitation    Nerve Conduction Studies Anti Sensory Summary  Table   Stim Site NR Peak (ms) Norm Peak (ms) P-T Amp (V) Norm P-T Amp Site1 Site2 Delta-P (ms) Dist (cm) Vel (m/s) Norm Vel (m/s)  Right Sup Fibular Anti Sensory (Ant Lat Mall)  30.5C  14 cm *NR  <4.4  >5.0 14 cm Ant Lat Mall  14.0  >32   Right Sural Anti Sensory (Lat Mall)  30.3C  Calf    3.8 <4.0 7.1 >5.0 Calf Lat Mall 3.8 14.0 37 >35  Site 2    4.0  4.9          Motor Summary Table   Stim Site NR Onset (ms) Norm Onset (ms) O-P Amp (mV) Norm O-P Amp Site1 Site2 Delta-0 (ms) Dist (cm) Vel (m/s) Norm Vel (m/s)  Right Fibular Motor (Ext Dig Brev)  30.2C  Ankle    4.2 <6.1 *0.3 >2.5 B Fib Ankle 6.4 32.0 50 >38  B Fib    10.6  1.0  Poplt B Fib 2.1 9.0 43 >40  Poplt    12.7  1.4         Right Tibial Motor (Abd Hall Brev)  30.4C  Ankle    4.8 <6.1 *0.6 >3.0 Knee Ankle 8.2 38.0 46 >35  Knee    13.0  2.0          EMG   Side Muscle Nerve Root Ins Act Fibs Psw Amp Dur Poly Recrt Int Fraser Din Comment  Right AntTibialis Dp Br Peron L4-5 Nml Nml Nml Nml Nml 0 Nml Nml   Right Fibularis Longus  Sup Br Peron L5-S1 Nml Nml Nml Nml Nml 0 Nml Nml   Right MedGastroc Tibial S1-2 Nml Nml Nml Nml Nml 0 Nml Nml   Right VastusMed Femoral L2-4 Nml Nml Nml Nml Nml 0 Nml Nml   Right BicepsFemS Sciatic L5-S1 Nml Nml Nml Nml Nml 0 Nml Nml   Right GluteusMed SupGluteal L4-S1 Nml Nml Nml Nml Nml 0 Nml Nml     Nerve Conduction Studies Anti Sensory Left/Right Comparison   Stim Site L Lat (ms) R Lat (ms) L-R Lat (ms) L Amp (V) R Amp (V) L-R Amp (%) Site1 Site2 L Vel (m/s) R Vel (m/s) L-R Vel (m/s)  Sup Fibular Anti Sensory (Ant Lat Mall)  30.5C  14 cm       14 cm Ant Lat Mall     Sural Anti Sensory (Lat Mall)  30.3C  Calf  3.8   7.1  Calf Lat Mall  37   Site 2  4.0   4.9         Motor Left/Right Comparison   Stim Site L Lat (ms) R Lat (ms) L-R Lat (ms) L Amp (mV) R Amp (mV) L-R Amp (%) Site1 Site2 L Vel (m/s) R Vel (m/s) L-R Vel (m/s)  Fibular Motor (Ext Dig Brev)  30.2C  Ankle  4.2   *0.3  B Fib Ankle  50   B Fib  10.6   1.0  Poplt B Fib  43   Poplt  12.7   1.4        Tibial Motor (Abd Hall Brev)  30.4C  Ankle  4.8   *0.6  Knee Ankle  46   Knee  13.0   2.0           Waveforms:           Clinical History: Acute Interface,  Incoming Rad Results - 10/27/2018  4:36 PM EST HISTORY:  left radicular pain, prior surg  2017  TECHNIQUE: MRI of the lumbar spine without contrast.  COMPARISON: September 21, 2016  FINDINGS: No evidence of acute fracture or subluxation. Minimal degenerative anterolisthesis of L4 on L5. Conus terminates in normal position. Small amount of chronic STIR signal abnormality in the left L4 and L5 pedicles, similar to prior study.   Degenerative changes as follows: T12-L1: No significant stenosis L1-L2: No significant stenosis L2-L3: Small disc bulge. There is a small left paracentral disc extrusion migrated inferiorly behind the L3 vertebral body. Mild facet degenerative changes. There is mild central canal and right-sided foraminal stenosis. L3-L4: Small disc bulge. Mild facet degenerative changes. Minimal central canal stenosis. Mild bilateral foraminal stenosis. L4-L5: Broad-based disc osteophyte complex which is eccentric to the left. Facet degenerative changes. Ligamentum flavum hypertrophy. There is moderate central canal stenosis. There is moderate-severe left and mild right foraminal stenosis. Findings are  similar to prior. L5-S1: Small broad-based disc bulge and facet degenerative changes. No significant stenosis.   IMPRESSION:  1. New small left paracentral disc extrusion at L2-L3, migrated inferiorly behind the L3 vertebral body. There is associated mild canal stenosis. 2. Central canal and foraminal stenosis remaining greatest at L4-L5. Similar to prior.  Please see above comments for full details.  Electronically Signed by: Oretha Milch     Objective:  VS:  HT:    WT:   BMI:     BP:   HR: bpm  TEMP: ( )  RESP:  Physical Exam Musculoskeletal:     Right lower leg: Edema present.     Left lower leg: No edema.     Comments: Examination of the right lower extremity shows venous stasis disease with extensive pitting edema from the mid shin through the foot.  Examination of  both feet shows some bilateral atrophy of the foot intrinsic musculature which can be normal for age.  There is no allodynia or color change.  Patient has good strength with dorsiflexion and plantarflexion EHL but he has some difficulty initiating movements.  Skin:    General: Skin is warm and dry.     Findings: Bruising and erythema present.  Neurological:     General: No focal deficit present.     Sensory: Sensory deficit present.     Motor: No weakness.     Gait: Gait abnormal.  Psychiatric:        Behavior: Behavior normal.     Imaging: No results found.

## 2021-08-27 NOTE — Progress Notes (Signed)
Office Visit Note   Patient: Andrew [name]           Date of Birth: 18-Dec-1950           MRN: [id number] Visit Date: 08/27/2021              Requested by: Bernerd Limbo, MD [address] [place],  [place] [id number] PCP: Bernerd Limbo, MD   Assessment & Plan: Visit Diagnoses:  1. Other spondylosis with radiculopathy, cervical region   2. Cervicalgia   3. Radiculopathy, cervical region   4. 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 Avoid prolong standing and walking. Avoid frequent bending and stooping  No lifting greater than 10 lbs. May use ice or moist heat for pain. Weight loss is of benefit. Handicap license is approved. Avoid overhead lifting and overhead use of the arms. Do not lift greater than 5 lbs. Adjust head rest in vehicle to prevent hyperextension if rear ended. Take extra precautions to avoid falling, including use of a cane if you feel weak. Knee is suffering from osteoarthritis, only real proven treatments are Weight loss, transdrmal diclofenac gel and exercise. Well padded shoes help. Ice the knee that is suffering from osteoarthritis, only real proven treatments are Ice the knee 2-3 times a day 15-20 mins at a time.-3 times a day 15-20 mins at a time. Hot showers in the AM.  Injection with steroid may be of benefit. Hemp CBD capsules, amazon.com 5,000-7,000 mg per bottle, 60 capsules per bottle, take one capsule twice a day. Cane in the left hand to use with left leg weight bearing. May call Dr. Ronnie Derby and enquire as to whether a rooster comb type Hyluronate injection may be of benefit. Referral to PT here at [facility] to See Robin for treatment and counselling concerning chronic right leg thrombophlebitis.  Follow-Up Instructions: Return if symptoms worsen or fail to improve.   Orders:  No orders of the defined types were placed in this  encounter.  No orders of the defined types were placed in this encounter.     Procedures: No procedures performed   Clinical Data: No additional findings.   Subjective: Chief Complaint  Patient presents with   Right Leg - Follow-up    EMG/NCS Review    70 year old male with right leg DVT and using eloquis for years he has right knee OA and recent left knee arthroscopy and was told he had some arthritis maybe 40% knee involvement as opposed to right knee that is 80% involved. He had the surgery by Dr. Ronnie Derby and initially had improvement in his pain but now it has returned. He is now longer experiencing neck pain that is of concern. He has done well with the RFA for arthrosis in the lumbar spine.     Review of Systems  Constitutional: Negative.   HENT: Negative.    Eyes: Negative.   Respiratory: Negative.    Cardiovascular: Negative.   Gastrointestinal: Negative.   Endocrine: Negative.   Genitourinary: Negative.   Musculoskeletal: Negative.   Skin: Negative.   Allergic/Immunologic: Negative.   Neurological: Negative.   Hematological: Negative.   Psychiatric/Behavioral: Negative.      Objective: Vital Signs: BP 135/74 (BP Location: Left Arm, Patient Position: Sitting)   Pulse 67   Ht '5\' 10"'$  (1.778 m)   Wt 196 lb (88.9 kg)   BMI 28.12 kg/m  Physical Exam Constitutional:      Appearance: He is well-developed.  HENT:     Head: Normocephalic and atraumatic.  Eyes:     Pupils: Pupils are equal, round, and reactive to light.  Pulmonary:     Effort: Pulmonary effort is normal.     Breath sounds: Normal breath sounds.  Abdominal:     General: Bowel sounds are normal.     Palpations: Abdomen is soft.  Musculoskeletal:        General: Normal range of motion.     Cervical back: Normal range of motion and neck supple.     Lumbar back: Negative right straight leg raise test and negative left straight leg raise test.  Skin:    General: Skin is warm and dry.   Neurological:     Mental Status: He is alert and oriented to person, place, and time.  Psychiatric:        Behavior: Behavior normal.        Thought Content: Thought content normal.        Judgment: Judgment normal.    Back Exam   Tenderness  The patient is experiencing tenderness in the lumbar and cervical.  Range of Motion  Extension:  normal  Flexion:  normal  Lateral bend right:  normal  Lateral bend left:  normal  Rotation right:  normal  Rotation left:  normal   Muscle Strength  Right Quadriceps:  5/5  Left Quadriceps:  5/5  Right Hamstrings:  5/5  Left Hamstrings:  5/5   Tests  Straight leg raise right: negative Straight leg raise left: negative  Reflexes  Patellar:  2/4 Achilles:  2/4 Biceps:  2/4 Babinski's sign: normal   Other  Toe walk: normal Heel walk: normal Gait: normal  Erythema: no back redness Scars: absent     Specialty Comments:  No specialty comments available.  Imaging: No results found.   PMFS History: Patient Active Problem List   Diagnosis Date Noted   History of deep vein thrombosis (DVT) of lower extremity 05/13/2021   Degenerative cervical disc 01/16/2021   Right shoulder pain 01/16/2021   Degenerative arthritis of knee 05/28/2020   Spondylosis without myelopathy or radiculopathy, lumbar region 10/11/2016   Migraines 06/08/2016   Nuclear sclerotic cataract of both eyes 06/08/2016   Presbyopia 06/08/2016   Hypermetropia of both eyes 06/08/2016   Long term current use of anticoagulant 06/02/2016   Recurrent right inguinal hernia 12/05/2014   Chronic venous insufficiency 11/28/2014   Bradycardia 10/11/2012   Right leg DVT ([place]) 10/03/2012   HLD (hyperlipidemia) 07/25/2012   Past Medical History:  Diagnosis Date   DVT (deep venous thrombosis) (HCC)    right leg in 2011; prior injury to right foot   Echocardiogram 07/2021   Echocardiogram 8/22: EF 60-65, no RWMA, mild LVH, low normal RVSF, trivial AI, mild AV  sclerosis w/o AS   HLD (hyperlipidemia)    Right foot injury     Family History  Problem Relation Age of Onset   Heart disease Mother    Heart disease Father    Vascular Disease Maternal Grandfather     No past surgical history on file. Social History   Occupational History   Not on file  Tobacco Use   Smoking status: Former    Types: Cigarettes    Quit date: 07/25/1982    Years since quitting: 39.1   Smokeless tobacco: Never   Tobacco comments:    REMOTE SOCIAL HISTORY  Substance and  Sexual Activity   Alcohol use: No   Drug use: No   Sexual activity: Not Currently

## 2021-09-09 ENCOUNTER — Ambulatory Visit: Payer: Medicare Other | Admitting: Family Medicine

## 2021-09-09 ENCOUNTER — Encounter: Payer: Self-pay | Admitting: Family Medicine

## 2021-09-09 ENCOUNTER — Ambulatory Visit: Payer: Self-pay

## 2021-09-09 ENCOUNTER — Ambulatory Visit (INDEPENDENT_AMBULATORY_CARE_PROVIDER_SITE_OTHER): Payer: Medicare Other

## 2021-09-09 ENCOUNTER — Other Ambulatory Visit: Payer: Self-pay

## 2021-09-09 VITALS — BP 108/70 | HR 74 | Ht 70.0 in | Wt 196.0 lb

## 2021-09-09 DIAGNOSIS — M2550 Pain in unspecified joint: Secondary | ICD-10-CM | POA: Diagnosis not present

## 2021-09-09 DIAGNOSIS — M25462 Effusion, left knee: Secondary | ICD-10-CM | POA: Diagnosis not present

## 2021-09-09 DIAGNOSIS — G8929 Other chronic pain: Secondary | ICD-10-CM

## 2021-09-09 DIAGNOSIS — M25562 Pain in left knee: Secondary | ICD-10-CM | POA: Diagnosis not present

## 2021-09-09 MED ORDER — DOXYCYCLINE HYCLATE 100 MG PO TABS: 100.0000 mg | ORAL_TABLET | Freq: Two times a day (BID) | ORAL | 0 refills | Status: DC

## 2021-09-09 NOTE — Progress Notes (Signed)
[place] Phone: [phone] Subjective:   Andrew [name], am serving as a scribe for Dr. Hulan [name]. This visit occurred during the SARS-CoV-2 public health emergency.  Safety protocols were in place, including screening questions prior to the visit, additional usage of staff PPE, and extensive cleaning of exam room while observing appropriate contact time as indicated for disinfecting solutions.   I'm seeing this patient by the request  of:  Andrew Limbo, MD  CC: Left knee pain  [id number]  Andrew [name] is a 70 y.o. male coming in with complaint of L knee pain. Last seen for neck pain in February 2022. Patient states that patient had surgery 3 months ago with Dr. Lorre [name]. Pain is constant over medial and lateral incision. Pain is present at rest and with weight bearing. [name] new injury. Patient did do PT which was helping. Patient is in more pain than before surgery.  Patient was seen by his surgeon 3 weeks ago and was given an injection.  Has not had any improvement and if anything worsening.  Patient is having difficulty with range of motion and feels like he is having increasing instability again.      Past Medical History:  Diagnosis Date   DVT (deep venous thrombosis) (HCC)    right leg in 2011; prior injury to right foot   Echocardiogram 07/2021   Echocardiogram 8/22: EF 60-65, [name] RWMA, mild LVH, low normal RVSF, trivial AI, mild AV sclerosis w/o AS   HLD (hyperlipidemia)    Right foot injury    [name] past surgical history on file. Social History   Socioeconomic History   Marital status: Divorced    Spouse name: Not on file   Number of children: Not on file   Years of education: Not on file   Highest education level: Not on file  Occupational History   Not on file  Tobacco Use   Smoking status: Former    Types: Cigarettes    Quit date: 07/25/1982    Years since quitting: 39.1   Smokeless  tobacco: Never   Tobacco comments:    REMOTE SOCIAL HISTORY  Substance and Sexual Activity   Alcohol use: [name]   Drug use: [name]   Sexual activity: Not Currently  Other Topics Concern   Not on file  Social History Narrative   Not on file   Social Determinants of Health   Financial Resource Strain: Not on file  Food Insecurity: Not on file  Transportation Needs: Not on file  Physical Activity: Not on file  Stress: Not on file  Social Connections: Not on file   Allergies  Allergen Reactions   Penicillins Anaphylaxis   Influenza Vaccines     Broke out in hives and had breathing problems   Family History  Problem Relation Age of Onset   Heart disease Mother    Heart disease Father    Vascular Disease Maternal Grandfather      Current Outpatient Medications (Cardiovascular):    atorvastatin (LIPITOR) 40 MG tablet, TAKE 1 TABLET (40 MG TOTAL) BY MOUTH DAILY AT 6 PM.  Current Outpatient Medications (Respiratory):    fluticasone-salmeterol (ADVAIR HFA) 115-21 MCG/ACT inhaler, Inhale 2 puffs into the lungs 2 (two) times daily.  Current Outpatient Medications (Analgesics):    allopurinol (ZYLOPRIM) 100 MG tablet, Take 1 tablet (100 mg total) by mouth daily.   meloxicam (MOBIC) 15 MG tablet, TAKE 1 TABLET (15 MG TOTAL) BY  MOUTH DAILY.   traMADol (ULTRAM) 50 MG tablet, Take 1 tablet (50 mg total) by mouth every 8 (eight) hours as needed.  Current Outpatient Medications (Hematological):    rivaroxaban (XARELTO) 10 MG TABS tablet, Take 1 tablet (10 mg total) by mouth daily.  Current Outpatient Medications (Other):    diclofenac Sodium (VOLTAREN) 1 % GEL, Place onto the skin.   doxycycline (VIBRA-TABS) 100 MG tablet, Take 1 tablet (100 mg total) by mouth 2 (two) times daily.   gabapentin (NEURONTIN) 300 MG capsule, Take 1 capsule (300 mg total) by mouth at bedtime.   triamcinolone ointment (KENALOG) 0.5 %, APPLY TO AFFECTED AREA TWICE A DAY   Reviewed prior external information  including notes and imaging from  primary care provider As well as notes that were available from care everywhere and other healthcare systems.  Past medical history, social, surgical and family history all reviewed in electronic medical record.  [name] pertanent information unless stated regarding to the chief complaint.   Review of Systems:  [name] headache, visual changes, nausea, vomiting, diarrhea, constipation, dizziness, abdominal pain, skin rash, fevers, chills, night sweats, weight loss, swollen lymph nodes, chest pain, shortness of breath, mood changes. POSITIVE muscle aches, joint swelling, body aches  Objective  Blood pressure 108/70, pulse 74, height 5\' 10"  (1.778 m), weight 196 lb (88.9 kg), SpO2 98 %.   General: [name] apparent distress alert and oriented x3 mood and affect normal, dressed appropriately.  HEENT: Pupils equal, extraocular movements intact  Respiratory: Patient's speak in full sentences and does not appear short of breath  Cardiovascular: [name] lower extremity edema, non tender, [name] erythema  Gait antalgic Left knee exam shows the patient does have crepitus noted.  Tender to palpation over the medial and lateral joint line but more on the medial.  Patient does have effusion noted.  Lacks last 10 degrees of flexion secondary to tightness.  Limited muscular skeletal ultrasound was performed and interpreted by Andrew [name], M  Limited ultrasound shows the patient does have a large effusion noted with the significant synovitis noted.  Increasing Doppler flow also noted in the area. Impression: Joint effusion with synovitis  Procedure: Real-time Ultrasound Guided Injection of left knee Device: GE Logiq Q7 Ultrasound guided injection is preferred based studies that show increased duration, increased effect, greater accuracy, decreased procedural pain, increased response rate, and decreased cost with ultrasound guided versus blind injection.  Verbal informed consent obtained.   Time-out conducted.  Noted [name] overlying erythema, induration, or other signs of local infection.  Skin prepped in a sterile fashion.  Local anesthesia: Topical Ethyl chloride.  With sterile technique and under real time ultrasound guidance: With a 22-gauge 2 inch needle patient was injected with 4 cc of 0.5% Marcaine and aspirated significantly dark fluid with good debris in the concentration of 52 cc. Completed without difficulty  Pain immediately improved suggesting accurate placement of the medication.  Advised to call if fevers/chills, erythema, induration, drainage, or persistent bleeding.  Impression: Technically successful ultrasound guided injection.   Impression and Recommendations:     The above documentation has been reviewed and is accurate and complete Lyndal Pulley, DO

## 2021-09-09 NOTE — Assessment & Plan Note (Signed)
Aspiration of dark fluid noted.  Patient did have significant synovitis noted.  This could still be postsurgical with patient having the surgery within the last 3 months.  Somewhat concerning is the amount of effusion noted in the dark coloration noted.  This is after a steroid injection given 3 weeks ago as well.  Patient is on a blood thinner which could be a possibility of this as well.  We will send the synovial fluid to the lab.  Secondary to the amount of fluid in as well as the color I would like to start patient on antibiotics but to noise discontinue if removing points towards the infectious etiology.  Follow-up with me again in 3 weeks

## 2021-09-09 NOTE — Patient Instructions (Addendum)
Drained knee today Will send to lab Doxy for 3 weeks See me in 3 weeks If symptoms worsen redness, swelling, pain, please seek medical care at the emergency room

## 2021-09-10 ENCOUNTER — Encounter: Payer: Self-pay | Admitting: Family Medicine

## 2021-09-10 LAB — SYNOVIAL FLUID ANALYSIS, COMPLETE
Basophils, %: 0 %
Eosinophils-Synovial: 0 % (ref 0–2)
Lymphocytes-Synovial Fld: 49 % (ref 0–74)
Monocyte/Macrophage: 30 % (ref 0–69)
Neutrophil, Synovial: 16 % (ref 0–24)
Synoviocytes, %: 5 % (ref 0–15)
WBC, Synovial: 338 cells/uL — ABNORMAL HIGH (ref <150)

## 2021-09-16 ENCOUNTER — Encounter: Payer: Self-pay | Admitting: Physical Therapy

## 2021-09-16 ENCOUNTER — Ambulatory Visit: Payer: Medicare Other | Attending: Specialist | Admitting: Physical Therapy

## 2021-09-16 ENCOUNTER — Other Ambulatory Visit: Payer: Self-pay

## 2021-09-16 DIAGNOSIS — R2689 Other abnormalities of gait and mobility: Secondary | ICD-10-CM | POA: Diagnosis not present

## 2021-09-16 NOTE — Patient Instructions (Signed)
Access Code: [email or web address] URL: [email or web address] Date: 09/16/2021 Prepared by: Shearon Balo  Exercises Standing Tandem Balance with Counter Support - 1 x daily - 7 x weekly - 3 sets - 60'' hold Standing Single Leg Stance with Counter Support - 1 x daily - 7 x weekly - 3 sets - 36'' hold

## 2021-09-16 NOTE — Therapy (Signed)
[place] [place], [place], [address] Phone: [phone]   Fax:  [phone]  Physical Therapy Evaluation  Patient Details  Name: Andrew [name] MRN: [id number] Date of Birth: 1951-01-11 Referring Provider (PT): Louanne Skye, MD   Encounter Date: 09/16/2021   PT End of Session - 09/16/21 1221     Visit Number 1    Number of Visits 1    PT Start Time 6237    PT Stop Time 1203    PT Time Calculation (min) 27 min             Past Medical History:  Diagnosis Date   DVT (deep venous thrombosis) ([place])    right leg in 2011; prior injury to right foot   Echocardiogram 07/2021   Echocardiogram 8/22: EF 60-65, no RWMA, mild LVH, low normal RVSF, trivial AI, mild AV sclerosis w/o AS   HLD (hyperlipidemia)    Right foot injury     History reviewed. No pertinent surgical history.  There were no vitals filed for this visit.    Subjective Assessment - 09/16/21 1219     Subjective Andrew [name] is a 70 y.o. male who presents to clinic with chief complaint of R calf swelling and discomfort.  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  This has caused chronic swelling which has worsened recently.  Pt referred by neurology.  Pain location: denies pain.  Red flags: chronic DVT.    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)    Referring Provider (PT) Louanne Skye, MD    Onset Date/Surgical Date 09/16/01    Hand Dominance Right      Precautions   Precaution Comments DVT R lower leg      Restrictions   Weight Bearing Restrictions No      Balance Screen   Has the patient fallen  in the past 6 months No      [place] residence    Living Arrangements Other relatives    Type of [place] Multi-level;Bed/bath upstairs    Home Equipment None      Prior Function   Level of [place] Retired      Associate Professor   Overall Cognitive Status Within Functional Limits for tasks assessed      Sensation   Light Touch --   diminshed dorsal and plantar surface of all digits, R foot     Functional Tests   Functional tests Other      Other:   Other/ Comments Balance - tandem: R in rear 30'', unstable      Strength   Overall Strength Comments R LE WFL, L knee strength diminished d/t menisectomy                        Objective measurements completed on examination: See above findings.                PT Education - 09/16/21 1220     Education Details  POC, diagnosis, prognosis, HEP.  Pt educated via explanation, demonstration, and handout (HEP).  Pt confirms understanding verbally.                           Plan - 09/16/21 1221     Clinical Impression Statement Andrew [name] is a 70 y.o. male who presents to clinic with signs and sxs consistent with chronic R DVT.  Pt is active, wears compression hose, elevates his leg, and appears to have adequate arterial supply.  There is little more we can offer pt in a formal PT setting.  He does have some minor balance deficit possibly secondary to sensation loss in his R foot.  I gave him some balance exercises to work on at home.  I will follow up with referring MD for possible referral to a vascular MD.    Stability/Clinical Decision Making Stable/Uncomplicated    Clinical Decision Making Low    PT Frequency One time visit    Recommended Other Services referral to vascular specialist             Patient will benefit from skilled therapeutic intervention in order to improve the following deficits and  impairments:  Abnormal gait, Increased edema  Visit Diagnosis: Balance problem     Problem List Patient Active Problem List   Diagnosis Date Noted   Effusion of left knee 09/09/2021   History of deep vein thrombosis (DVT) of lower extremity 05/13/2021   Degenerative cervical disc 01/16/2021   Right shoulder pain 01/16/2021   Degenerative arthritis of knee 05/28/2020   Spondylosis without myelopathy or radiculopathy, lumbar region 10/11/2016   Migraines 06/08/2016   Nuclear sclerotic cataract of both eyes 06/08/2016   Presbyopia 06/08/2016   Hypermetropia of both eyes 06/08/2016   Long term current use of anticoagulant 06/02/2016   Recurrent right inguinal hernia 12/05/2014   Chronic venous insufficiency 11/28/2014   Bradycardia 10/11/2012   Right leg DVT ([place]) 10/03/2012   HLD (hyperlipidemia) 07/25/2012    Andrew [name], PT 09/16/2021, 1:58 PM  [facility] [address] [place], [place], [address] Phone: [phone]   Fax:  [phone]  Name: Andrew [name] MRN: [id number] Date of Birth: [birth year]

## 2021-09-29 NOTE — Progress Notes (Signed)
[place] Phone: [phone] Subjective:   Andrew Andrew [name], am serving as a scribe for Dr. Hulan [name]. This visit occurred during the SARS-CoV-2 public health emergency.  Safety protocols were in place, including screening questions prior to the visit, additional usage of staff PPE, and extensive cleaning of exam room while observing appropriate contact time as indicated for disinfecting solutions.   I'm seeing this patient by the request  of:  Andrew Limbo, MD  CC: Left knee pain follow-up  EXB:[id number]  09/09/2021 Aspiration of dark fluid noted.  Patient did have significant synovitis noted.  This could still be postsurgical with patient having the surgery within the last 3 months.  Somewhat concerning is the amount of effusion noted in the dark coloration noted.  This is after a steroid injection given 3 weeks ago as well.  Patient is on a blood thinner which could be a possibility of this as well.  We will send the synovial fluid to the lab.  Secondary to the amount of fluid in as well as the color I would like to start patient on antibiotics but to noise discontinue if removing points towards the infectious etiology.  Follow-up with me again in 3 weeks  Update 02/72/5366 Andrew Andrew [name] is a 70 y.o. male coming in with complaint of left knee pain. Patient states that pain subsided for a few days after injection. Pain is present over medial aspect especially at night. Braces tend to help alleviate some pain.  Patient's synovial fluid did not show any type of infectious etiology and did not show any type of gouty deposits or crystals.  Patient states for 5 days felt significantly better but now having worsening discomfort and swelling again.  Patient is accompanied with her brother who helps with the interpretation.    Past Medical History:  Diagnosis Date   DVT (deep venous thrombosis) ([facility])    right leg in 2011;  prior injury to right foot   Echocardiogram 07/2021   Echocardiogram 8/22: EF 60-65, Andrew [name] RWMA, mild LVH, low normal RVSF, trivial AI, mild AV sclerosis w/o AS   HLD (hyperlipidemia)    Right foot injury    Andrew [name] past surgical history on file. Social History   Socioeconomic History   Marital status: Divorced    Spouse name: Not on file   Number of children: Not on file   Years of education: Not on file   Highest education level: Not on file  Occupational History   Not on file  Tobacco Use   Smoking status: Former    Types: Cigarettes    Quit date: 07/25/1982    Years since quitting: 39.2   Smokeless tobacco: Never   Tobacco comments:    REMOTE SOCIAL HISTORY  Substance and Sexual Activity   Alcohol use: Andrew [name]   Drug use: Andrew [name]   Sexual activity: Not Currently  Other Topics Concern   Not on file  Social History Narrative   Not on file   Social Determinants of Health   Financial Resource Strain: Not on file  Food Insecurity: Not on file  Transportation Needs: Not on file  Physical Activity: Not on file  Stress: Not on file  Social Connections: Not on file   Allergies  Allergen Reactions   Penicillins Anaphylaxis   Influenza Vaccines     Broke out in hives and had breathing problems   Family History  Problem Relation Age of Onset  Heart disease Mother    Heart disease Father    Vascular Disease Maternal Grandfather      Current Outpatient Medications (Cardiovascular):    atorvastatin (LIPITOR) 40 MG tablet, TAKE 1 TABLET (40 MG TOTAL) BY MOUTH DAILY AT 6 PM.  Current Outpatient Medications (Respiratory):    fluticasone-salmeterol (ADVAIR HFA) 115-21 MCG/ACT inhaler, Inhale 2 puffs into the lungs 2 (two) times daily.  Current Outpatient Medications (Analgesics):    allopurinol (ZYLOPRIM) 100 MG tablet, Take 2 tablets (200 mg total) by mouth daily.   meloxicam (MOBIC) 15 MG tablet, TAKE 1 TABLET (15 MG TOTAL) BY MOUTH DAILY.   traMADol (ULTRAM) 50 MG tablet, Take 1  tablet (50 mg total) by mouth every 8 (eight) hours as needed.  Current Outpatient Medications (Hematological):    rivaroxaban (XARELTO) 10 MG TABS tablet, Take 1 tablet (10 mg total) by mouth daily.  Current Outpatient Medications (Other):    diclofenac Sodium (VOLTAREN) 1 % GEL, Place onto the skin.   doxycycline (VIBRA-TABS) 100 MG tablet, Take 1 tablet (100 mg total) by mouth 2 (two) times daily.   gabapentin (NEURONTIN) 300 MG capsule, Take 1 capsule (300 mg total) by mouth at bedtime.   triamcinolone ointment (KENALOG) 0.5 %, APPLY TO AFFECTED AREA TWICE A DAY   Reviewed prior external information including notes and imaging from  primary care provider As well as notes that were available from care everywhere and other healthcare systems.  Past medical history, social, surgical and family history all reviewed in electronic medical record.  Andrew [name] pertanent information unless stated regarding to the chief complaint.   Review of Systems:  Andrew [name] headache, visual changes, nausea, vomiting, diarrhea, constipation, dizziness, abdominal pain, skin rash, fevers, chills, night sweats, weight loss, swollen lymph nodes,  chest pain, shortness of breath, mood changes. POSITIVE muscle aches, body aches, joint swelling  Objective  Blood pressure 124/82, pulse 63, height 5\' 10"  (1.778 m), weight 199 lb (90.3 kg), SpO2 98 %.   General: Andrew [name] apparent distress alert and oriented x3 mood and affect normal, dressed appropriately.  HEENT: Pupils equal, extraocular movements intact  Respiratory: Patient's speak in full sentences and does not appear short of breath  Cardiovascular: Andrew [name] lower extremity edema, non tender, Andrew [name] erythema  Gait antalgic favoring the left knee. Left knee exam does have effusion noted.  Patient does have limited range of motion.  Patient has Andrew [name] erythema or warmness though which is a little improvement.  Still instability with valgus and varus force and the tenderness mostly over the medial  joint space.   Impression and Recommendations:    The above documentation has been reviewed and is accurate and complete Andrew Pulley, DO

## 2021-09-30 ENCOUNTER — Other Ambulatory Visit: Payer: Self-pay

## 2021-09-30 ENCOUNTER — Ambulatory Visit: Payer: Self-pay

## 2021-09-30 ENCOUNTER — Ambulatory Visit: Payer: Medicare Other | Admitting: Family Medicine

## 2021-09-30 VITALS — BP 124/82 | HR 63 | Ht 70.0 in | Wt 199.0 lb

## 2021-09-30 DIAGNOSIS — M25562 Pain in left knee: Secondary | ICD-10-CM | POA: Diagnosis not present

## 2021-09-30 DIAGNOSIS — G8929 Other chronic pain: Secondary | ICD-10-CM | POA: Diagnosis not present

## 2021-09-30 DIAGNOSIS — M25462 Effusion, left knee: Secondary | ICD-10-CM

## 2021-09-30 MED ORDER — ALLOPURINOL 100 MG PO TABS: 200.0000 mg | ORAL_TABLET | Freq: Every day | ORAL | 0 refills | Status: DC

## 2021-09-30 NOTE — Patient Instructions (Addendum)
Wear brace with activity Continue to ice  Allopurinol 200mg  daily See me again 4 weeks

## 2021-09-30 NOTE — Assessment & Plan Note (Addendum)
Patient did not respond as well to the aspiration was having the accumulation occur again.  We discussed with patient about icing regimen and home exercises.  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  Do not feel that another arthroscopic procedure would be any significant improvement at this time.  Discussed with him that the most likely will help with the possible replacement.  Patient is not ready to do this at the moment.  Patient would like to consider the possibility of viscosupplementation again.  We will increase allopurinol to 200 mg to see if that will help with some of the aspiration but fluid did not show any significant crystals at this time.  Patient does get some mild pain medication from another provider if necessary tramadol of the benefit.  Patient is having some instability and given a hinged brace.  Follow-up again in 4 to 8 weeks reviewing patient's labs, medications, and discussing with patient and other treatment options including surgical intervention total of 31

## 2021-10-15 ENCOUNTER — Other Ambulatory Visit: Payer: Self-pay | Admitting: Family Medicine

## 2021-10-15 ENCOUNTER — Other Ambulatory Visit: Payer: Self-pay | Admitting: Cardiovascular Disease

## 2021-10-15 ENCOUNTER — Other Ambulatory Visit: Payer: Self-pay | Admitting: Podiatry

## 2021-10-26 ENCOUNTER — Other Ambulatory Visit: Payer: Self-pay

## 2021-10-26 ENCOUNTER — Encounter: Payer: Self-pay | Admitting: Pulmonary Disease

## 2021-10-26 ENCOUNTER — Ambulatory Visit (INDEPENDENT_AMBULATORY_CARE_PROVIDER_SITE_OTHER): Payer: Medicare Other | Admitting: Pulmonary Disease

## 2021-10-26 VITALS — BP 148/78 | HR 67 | Temp 98.5°F | Ht 69.25 in | Wt 203.0 lb

## 2021-10-26 DIAGNOSIS — R053 Chronic cough: Secondary | ICD-10-CM

## 2021-10-26 DIAGNOSIS — Z8616 Personal history of COVID-19: Secondary | ICD-10-CM | POA: Diagnosis not present

## 2021-10-26 NOTE — Patient Instructions (Addendum)
Recent COVID-19 Shortness of breath Right rib pain - resolved --CONTINUE Advair 115-21 mcg TWO puffs TWICE a day --OK to use ONE puff TWICE if symptoms are controlled  Follow-up in two months with me to discuss de-escalation of inhalers

## 2021-10-26 NOTE — Progress Notes (Signed)
Subjective:   PATIENT ID: Andrew [name]: 07-02-1951, MRN: [id number]   HPI  Chief Complaint  Patient presents with   Follow-up    Hx of covid    Reason for Visit: Follow-up  Mr. Andrew [name] is a 70 year old male with history of DVT, chronic leg swelling and HLD who presents follow-up. Brother and sister present for translation.  Synopsis He was diagnosed with COVID-19 two months ago. He continues to have persistent cough. Reports left rib cage pain.  Worsens when he coughs and lays on the left side. He reports that he is still coughing at night. Oxygen saturations will go to 90%. He reports cough is occasionally productive but mainly dry cough. He does have wheezing when laying down at night. Duonebs helped significantly. Denies chronic respiratory illnesses.   10/26/21 Since our last visit he was started on Advair and he reports his cough has improved. Has been using the Advair 1 puff twice a day for 3 days a week. Denies wheezing or shortness of breath. Still coughing some clear phlegm. Has not needed to use nebulizer. No nocturnal symtpoms. No longer having chest/rib pain.  Social History: <3 years. Social smoker.  Past Medical History:  Diagnosis Date   DVT (deep venous thrombosis) ([facility])    right leg in 2011; prior injury to right foot   Echocardiogram 07/2021   Echocardiogram 8/22: EF 60-65, no RWMA, mild LVH, low normal RVSF, trivial AI, mild AV sclerosis w/o AS   HLD (hyperlipidemia)    Right foot injury      Family History  Problem Relation Age of Onset   Heart disease Mother    Heart disease Father    Vascular Disease Maternal Grandfather      Social History   Occupational History   Not on file  Tobacco Use   Smoking status: Former    Types: Cigarettes    Quit date: 07/25/1982    Years since quitting: 39.2   Smokeless tobacco: Never   Tobacco comments:    REMOTE SOCIAL HISTORY  Substance and Sexual Activity   Alcohol use: No    Drug use: No   Sexual activity: Not Currently    Allergies  Allergen Reactions   Penicillins Anaphylaxis   Influenza Vaccines     Broke out in hives and had breathing problems     Outpatient Medications Prior to Visit  Medication Sig Dispense Refill   allopurinol (ZYLOPRIM) 100 MG tablet Take 2 tablets (200 mg total) by mouth daily. 180 tablet 0   atorvastatin (LIPITOR) 40 MG tablet TAKE 1 TABLET (40 MG TOTAL) BY MOUTH DAILY AT 6 PM. 90 tablet 3   diclofenac Sodium (VOLTAREN) 1 % GEL Place onto the skin.     doxycycline (VIBRA-TABS) 100 MG tablet Take 1 tablet (100 mg total) by mouth 2 (two) times daily. 42 tablet 0   fluticasone-salmeterol (ADVAIR HFA) 115-21 MCG/ACT inhaler Inhale 2 puffs into the lungs 2 (two) times daily. 1 each 2   gabapentin (NEURONTIN) 300 MG capsule Take 1 capsule (300 mg total) by mouth at bedtime. 90 capsule 3   meloxicam (MOBIC) 15 MG tablet TAKE 1 TABLET (15 MG TOTAL) BY MOUTH DAILY. 30 tablet 1   traMADol (ULTRAM) 50 MG tablet Take 1 tablet (50 mg total) by mouth every 8 (eight) hours as needed. 40 tablet 0   triamcinolone ointment (KENALOG) 0.5 % APPLY TO AFFECTED AREA TWICE A DAY     rivaroxaban (XARELTO)  10 MG TABS tablet Take 1 tablet (10 mg total) by mouth daily. 90 tablet 3   No facility-administered medications prior to visit.    Review of Systems  Constitutional:  Negative for chills, diaphoresis, fever, malaise/fatigue and weight loss.  HENT:  Negative for congestion.   Respiratory:  Positive for cough and sputum production. Negative for hemoptysis, shortness of breath and wheezing.   Cardiovascular:  Negative for chest pain, palpitations and leg swelling.    Objective:   Vitals:   10/26/21 1059  BP: (!) 148/78  Pulse: 67  Temp: 98.5 F (36.9 C)  TempSrc: Oral  SpO2: 98%  Weight: 203 lb (92.1 kg)  Height: 5' 9.25" (1.759 m)   SpO2: 98 % O2 Device: None (Room air)  Physical Exam: General: Well-appearing, no acute distress HENT:  [place], AT Eyes: EOMI, no scleral icterus Respiratory: Clear to auscultation bilaterally.  No crackles, wheezing or rales Cardiovascular: RRR, -M/R/G, no JVD Extremities:-Edema,-tenderness Neuro: AAO x4, CNII-XII grossly intact Psych: Normal mood, normal affect  Data Reviewed:  Imaging: CXR 12/12/14 - No infiltrate, edema, effusion CXR 08/05/2021 bibasilar atelectasis.  No rib fracture present  PFT: None on file  Echocardiogream: EF 60-65%. Mild LVH  Labs: CBC    Component Value Date/Time   WBC 6.6 07/15/2021 1039   WBC 8.1 04/28/2016 1113   RBC 4.59 07/15/2021 1039   RBC 4.70 04/28/2016 1113   HGB 13.6 07/15/2021 1039   HCT 41.2 07/15/2021 1039   PLT 217 07/15/2021 1039   MCV 90 07/15/2021 1039   MCH 29.6 07/15/2021 1039   MCH 29.8 04/28/2016 1113   MCHC 33.0 07/15/2021 1039   MCHC 32.1 04/28/2016 1113   RDW 12.6 07/15/2021 1039   LYMPHSABS 1.2 08/11/2013 1102   MONOABS 0.5 08/11/2013 1102   EOSABS 0.1 08/11/2013 1102   BASOSABS 0.0 08/11/2013 1102   Absolute eos 08/11/13 - 100     Assessment & Plan:   Discussion: 70 year old [place] speaking male with history of DVT, chronic leg swelling and hyperlipidemia who presents for follow-up for persistent symptoms secondary to COVID-19 infection.  Significant improvement with resolution of wheezing, shortness of breath and chest pain on intermittent LABA/ICS use.  He continues to have some cough.  We discussed the clinical course of COVID-19 including slow resolution from up to 3 to 6 months.  With his improvement I am optimistic that he will have full recovery.  Would recommend continuing bronchodilator in the interim.  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 - improving but persistent on partial treatment Shortness of breath - resolved Right rib pain - resolved --Counseled on bronchodilator adherence as prescribed --CONTINUE Advair 115-21 mcg TWO puffs TWICE a day --OK to use ONE puff TWICE if symptoms are  controlled  Follow-up in two months to discuss de-escalation   Health Maintenance Immunization History  Administered Date(s) Administered   Influenza Split 12/13/2002   PFIZER Comirnaty(Gray Top)Covid-19 Tri-Sucrose Vaccine 07/09/2021   PFIZER(Purple Top)SARS-COV-2 Vaccination 02/14/2020, 03/11/2020, 09/26/2020   PPD Test 12/13/2010   Pneumococcal Conjugate-13 12/15/2017   Pneumococcal Polysaccharide-23 07/10/2019   Tdap 12/13/2008   CT Lung Screen-not qualified.  Never smoker  No orders of the defined types were placed in this encounter.  No orders of the defined types were placed in this encounter.  Return in about 2 months (around 12/26/2021).  I have spent a total time of 35-minutes on the day of the appointment reviewing prior documentation, coordinating care and discussing medical diagnosis and plan with the patient/family. Past  medical history, allergies, medications were reviewed. Pertinent imaging, labs and tests included in this note have been reviewed and interpreted independently by me.  [place], MD [facility] 10/26/2021 11:22 AM  Office Number [phone]

## 2021-10-28 ENCOUNTER — Encounter: Payer: Self-pay | Admitting: Pulmonary Disease

## 2021-10-28 DIAGNOSIS — Z8616 Personal history of COVID-19: Secondary | ICD-10-CM | POA: Insufficient documentation

## 2021-10-28 DIAGNOSIS — R053 Chronic cough: Secondary | ICD-10-CM | POA: Insufficient documentation

## 2021-11-02 ENCOUNTER — Other Ambulatory Visit: Payer: Self-pay | Admitting: Pulmonary Disease

## 2021-11-10 NOTE — Progress Notes (Signed)
Andrew [name] [place] [address] [place] Phone: [phone] Subjective:   [name], am serving as a scribe for Dr. Hulan Saas. This visit occurred during the SARS-CoV-2 public health emergency.  Safety protocols were in place, including screening questions prior to the visit, additional usage of staff PPE, and extensive cleaning of exam room while observing appropriate contact time as indicated for disinfecting solutions.   I'm seeing this patient by the request  of:  Bernerd Limbo, MD  CC: Knee pain follow-up  DGL:[id number]  09/30/2021 Patient did not respond as well to the aspiration was having the accumulation occur again.  We discussed with patient about icing regimen and home exercises.  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  Do not feel that another arthroscopic procedure would be any significant improvement at this time.  Discussed with him that the most likely will help with the possible replacement.  Patient is not ready to do this at the moment.  Patient would like to consider the possibility of viscosupplementation again.  We will increase allopurinol to 200 mg to see if that will help with some of the aspiration but fluid did not show any significant crystals at this time.  Patient does get some mild pain medication from another provider if necessary tramadol of the benefit.  Patient is having some instability and given a hinged brace.  Follow-up again in 4 to 8 weeks reviewing patient's labs, medications, and discussing with patient and other treatment options including surgical intervention total of 31  Update 18/84/1660 Andrew [name] is a 70 y.o. male coming in with complaint of L knee pain. Patient states still in pain. No worse. Pain in same location. Medicines, heat, and massage help, but that's only temporary.  No new complaints.  Continues to have  instability.  Continues to have difficulty with the knee with swelling and is considering the possibility of surgical intervention.      Past Medical History:  Diagnosis Date   DVT (deep venous thrombosis) (HCC)    right leg in 2011; prior injury to right foot   Echocardiogram 07/2021   Echocardiogram 8/22: EF 60-65, no RWMA, mild LVH, low normal RVSF, trivial AI, mild AV sclerosis w/o AS   HLD (hyperlipidemia)    Right foot injury    No past surgical history on file. Social History   Socioeconomic History   Marital status: Divorced    Spouse name: Not on file   Number of children: Not on file   Years of education: Not on file   Highest education level: Not on file  Occupational History   Not on file  Tobacco Use   Smoking status: Former    Types: Cigarettes    Quit date: 07/25/1982    Years since quitting: 39.3   Smokeless tobacco: Never   Tobacco comments:    REMOTE SOCIAL HISTORY  Substance and Sexual Activity   Alcohol use: No   Drug use: No   Sexual activity: Not Currently  Other Topics Concern   Not on file  Social History Narrative   Not on file   Social Determinants of Health   Financial Resource Strain: Not on file  Food Insecurity: Not on file  Transportation Needs: Not on file  Physical Activity: Not on file  Stress: Not on file  Social Connections: Not on file   Allergies  Allergen Reactions   Penicillins Anaphylaxis  Influenza Vaccines     Broke out in hives and had breathing problems   Family History  Problem Relation Age of Onset   Heart disease Mother    Heart disease Father    Vascular Disease Maternal Grandfather      Current Outpatient Medications (Cardiovascular):    atorvastatin (LIPITOR) 40 MG tablet, TAKE 1 TABLET (40 MG TOTAL) BY MOUTH DAILY AT 6 PM.  Current Outpatient Medications (Respiratory):    fluticasone-salmeterol (ADVAIR HFA) 115-21 MCG/ACT inhaler, INHALE 2 PUFFS INTO THE LUNGS TWICE A DAY  Current Outpatient  Medications (Analgesics):    allopurinol (ZYLOPRIM) 100 MG tablet, Take 2 tablets (200 mg total) by mouth daily.   meloxicam (MOBIC) 15 MG tablet, TAKE 1 TABLET (15 MG TOTAL) BY MOUTH DAILY.   traMADol (ULTRAM) 50 MG tablet, Take 1 tablet (50 mg total) by mouth every 8 (eight) hours as needed.  Current Outpatient Medications (Hematological):    rivaroxaban (XARELTO) 10 MG TABS tablet, Take 1 tablet (10 mg total) by mouth daily.  Current Outpatient Medications (Other):    diclofenac Sodium (VOLTAREN) 1 % GEL, Place onto the skin.   doxycycline (VIBRA-TABS) 100 MG tablet, Take 1 tablet (100 mg total) by mouth 2 (two) times daily.   gabapentin (NEURONTIN) 300 MG capsule, Take 1 capsule (300 mg total) by mouth at bedtime.   triamcinolone ointment (KENALOG) 0.5 %, APPLY TO AFFECTED AREA TWICE A DAY   Reviewed prior external information including notes and imaging from  primary care provider As well as notes that were available from care everywhere and other healthcare systems.  Past medical history, social, surgical and family history all reviewed in electronic medical record.  No pertanent information unless stated regarding to the chief complaint.   Review of Systems:  No headache, visual changes, nausea, vomiting, diarrhea, constipation, dizziness, abdominal pain, skin rash, fevers, chills, night sweats, weight loss, swollen lymph nodes, body aches, joint swelling, chest pain, shortness of breath, mood changes. POSITIVE muscle aches  Objective  Blood pressure 120/80, pulse 65, height 5\' 9"  (1.753 m), weight 197 lb (89.4 kg), SpO2 96 %.   General: No apparent distress alert and oriented x3 mood and affect normal, dressed appropriately.  HEENT: Pupils equal, extraocular movements intact  Respiratory: Patient's speak in full sentences and does not appear short of breath  Cardiovascular: No lower extremity edema, non tender, no erythema  Gait antalgic gait noted. Patient's left knee does  have trace effusion noted.  No significant crepitus and instability with valgus and varus force.  Patient does have abnormal thigh to calf ratio noted.  Limited range of motion lacking last 5 degrees of extension in the last 10 degrees of flexion  After informed written and verbal consent, patient was seated on exam table. Left knee was prepped with alcohol swab and utilizing anterolateral approach, patient's left knee space was injected with 4:1  marcaine 0.5%: Kenalog 40mg /dL. Patient tolerated the procedure well without immediate complications.    Impression and Recommendations:     The above documentation has been reviewed and is accurate and complete Lyndal Pulley, DO

## 2021-11-11 ENCOUNTER — Ambulatory Visit: Payer: Self-pay

## 2021-11-11 ENCOUNTER — Encounter: Payer: Self-pay | Admitting: Family Medicine

## 2021-11-11 ENCOUNTER — Ambulatory Visit (INDEPENDENT_AMBULATORY_CARE_PROVIDER_SITE_OTHER): Payer: Medicare Other | Admitting: Family Medicine

## 2021-11-11 ENCOUNTER — Other Ambulatory Visit: Payer: Self-pay

## 2021-11-11 VITALS — BP 120/80 | HR 65 | Ht 69.0 in | Wt 197.0 lb

## 2021-11-11 DIAGNOSIS — M170 Bilateral primary osteoarthritis of knee: Secondary | ICD-10-CM | POA: Diagnosis not present

## 2021-11-11 DIAGNOSIS — M25462 Effusion, left knee: Secondary | ICD-10-CM | POA: Diagnosis not present

## 2021-11-11 NOTE — Patient Instructions (Signed)
Great to see you. Given injection today to help with pain and swelling, Will get approval for gel. They should call you on the custom brace. See me again in 4-5 weeks

## 2021-11-11 NOTE — Assessment & Plan Note (Signed)
Repeat injection given November 11, 2021.  We will get approval as well for the viscosupplementation.  Secondary to abnormal thigh to calf ratio as well as instability of the knee with valgus and varus force I do feel a custom OA stability brace would be beneficial.  We will get patient set up for this as well.  Patient will follow up again in 4 weeks to hopefully also have the viscosupplementation.

## 2021-11-26 ENCOUNTER — Telehealth: Payer: Self-pay | Admitting: Specialist

## 2021-11-26 NOTE — Telephone Encounter (Signed)
Patient's sister-in law called requesting a referral to Dr. Narda Amber from [facility]  Fax: [phone]

## 2021-11-27 ENCOUNTER — Other Ambulatory Visit: Payer: Self-pay | Admitting: Radiology

## 2021-11-27 DIAGNOSIS — M542 Cervicalgia: Secondary | ICD-10-CM

## 2021-11-27 DIAGNOSIS — M4722 Other spondylosis with radiculopathy, cervical region: Secondary | ICD-10-CM

## 2021-12-02 DIAGNOSIS — M25562 Pain in left knee: Secondary | ICD-10-CM | POA: Insufficient documentation

## 2021-12-16 NOTE — Progress Notes (Signed)
[place] Phone: [phone] Subjective:   [name], am serving as a scribe for Dr. Hulan Saas.This visit occurred during the SARS-CoV-2 public health emergency.  Safety protocols were in place, including screening questions prior to the visit, additional usage of staff PPE, and extensive cleaning of exam room while observing appropriate contact time as indicated for disinfecting solutions.  I'm seeing this patient by the request  of:  Bernerd Limbo, MD  CC: back and foot pain  ONG:[id number]  11/11/2021 Repeat injection given November 11, 2021.  We will get approval as well for the viscosupplementation.  Secondary to abnormal thigh to calf ratio as well as instability of the knee with valgus and varus force I do feel a custom OA stability brace would be beneficial.  We will get patient set up for this as well.  Patient will follow up again in 4 weeks to hopefully also have the viscosupplementation.  Update 4/0/1027 Candido Flott is a 71 y.o. male coming in with complaint of L knee pain. Patient states that Dr. Maureen Ralphs recommends knee replacement surgery. Has had some relief from steroid injection.  Patient is having worsening pain again at this time.  Also having numbness in toes in R foot for past 9 months. Denies any lower back pain.        Past Medical History:  Diagnosis Date   DVT (deep venous thrombosis) (HCC)    right leg in 2011; prior injury to right foot   Echocardiogram 07/2021   Echocardiogram 8/22: EF 60-65, no RWMA, mild LVH, low normal RVSF, trivial AI, mild AV sclerosis w/o AS   HLD (hyperlipidemia)    Right foot injury    No past surgical history on file. Social History   Socioeconomic History   Marital status: Divorced    Spouse name: Not on file   Number of children: Not on file   Years of education: Not on file   Highest education level: Not on file  Occupational History    Not on file  Tobacco Use   Smoking status: Former    Types: Cigarettes    Quit date: 07/25/1982    Years since quitting: 39.4   Smokeless tobacco: Never   Tobacco comments:    REMOTE SOCIAL HISTORY  Substance and Sexual Activity   Alcohol use: No   Drug use: No   Sexual activity: Not Currently  Other Topics Concern   Not on file  Social History Narrative   Not on file   Social Determinants of Health   Financial Resource Strain: Not on file  Food Insecurity: Not on file  Transportation Needs: Not on file  Physical Activity: Not on file  Stress: Not on file  Social Connections: Not on file   Allergies  Allergen Reactions   Penicillins Anaphylaxis   Influenza Vaccines     Broke out in hives and had breathing problems   Family History  Problem Relation Age of Onset   Heart disease Mother    Heart disease Father    Vascular Disease Maternal Grandfather      Current Outpatient Medications (Cardiovascular):    atorvastatin (LIPITOR) 40 MG tablet, TAKE 1 TABLET (40 MG TOTAL) BY MOUTH DAILY AT 6 PM.  Current Outpatient Medications (Respiratory):    fluticasone-salmeterol (ADVAIR HFA) 115-21 MCG/ACT inhaler, INHALE 2 PUFFS INTO THE LUNGS TWICE A DAY  Current Outpatient Medications (Analgesics):    allopurinol (ZYLOPRIM) 100 MG tablet,  Take 2 tablets (200 mg total) by mouth daily.   meloxicam (MOBIC) 15 MG tablet, TAKE 1 TABLET (15 MG TOTAL) BY MOUTH DAILY.   traMADol (ULTRAM) 50 MG tablet, Take 1 tablet (50 mg total) by mouth every 8 (eight) hours as needed.  Current Outpatient Medications (Hematological):    rivaroxaban (XARELTO) 10 MG TABS tablet, Take 1 tablet (10 mg total) by mouth daily.  Current Outpatient Medications (Other):    diclofenac Sodium (VOLTAREN) 1 % GEL, Place onto the skin.   doxycycline (VIBRA-TABS) 100 MG tablet, Take 1 tablet (100 mg total) by mouth 2 (two) times daily.   gabapentin (NEURONTIN) 300 MG capsule, Take 1 capsule (300 mg total) by  mouth at bedtime.   triamcinolone ointment (KENALOG) 0.5 %, APPLY TO AFFECTED AREA TWICE A DAY   Reviewed prior external information including notes and imaging from  primary care provider As well as notes that were available from care everywhere and other healthcare systems.  Past medical history, social, surgical and family history all reviewed in electronic medical record.  No pertanent information unless stated regarding to the chief complaint.   Review of Systems:  No headache, visual changes, nausea, vomiting, diarrhea, constipation, dizziness, abdominal pain, skin rash, fevers, chills, night sweats, weight loss, swollen lymph nodes, body aches, joint swelling, chest pain, shortness of breath, mood changes. POSITIVE muscle aches  Objective  Blood pressure 124/74, pulse 84, height 5\' 9"  (1.753 m), weight 195 lb (88.5 kg), SpO2 96 %.   General: No apparent distress alert and oriented x3 mood and affect normal, dressed appropriately.  HEENT: Pupils equal, extraocular movements intact  Respiratory: Patient's speak in full sentences and does not appear short of breath  Cardiovascular: No lower extremity edema, non tender, no erythema  Gait normal with good balance and coordination.  MSK: Continued instability noted of the left knee. Patient's right foot does have significant breakdown of the transverse arch.  Patient does have mild swelling over the midfoot.  Patient does have a positive squeeze test.    Impression and Recommendations:     The above documentation has been reviewed and is accurate and complete Lyndal Pulley, DO

## 2021-12-16 NOTE — Progress Notes (Signed)
error 

## 2021-12-17 ENCOUNTER — Encounter: Payer: Self-pay | Admitting: Family Medicine

## 2021-12-17 ENCOUNTER — Other Ambulatory Visit: Payer: Self-pay

## 2021-12-17 ENCOUNTER — Ambulatory Visit (INDEPENDENT_AMBULATORY_CARE_PROVIDER_SITE_OTHER): Payer: Medicare Other

## 2021-12-17 ENCOUNTER — Ambulatory Visit (INDEPENDENT_AMBULATORY_CARE_PROVIDER_SITE_OTHER): Payer: Medicare Other | Admitting: Family Medicine

## 2021-12-17 VITALS — BP 124/74 | HR 84 | Ht 69.0 in | Wt 195.0 lb

## 2021-12-17 DIAGNOSIS — M216X1 Other acquired deformities of right foot: Secondary | ICD-10-CM | POA: Diagnosis not present

## 2021-12-17 DIAGNOSIS — M545 Low back pain, unspecified: Secondary | ICD-10-CM

## 2021-12-17 DIAGNOSIS — M170 Bilateral primary osteoarthritis of knee: Secondary | ICD-10-CM | POA: Diagnosis not present

## 2021-12-17 NOTE — Assessment & Plan Note (Signed)
Discussed with patient and patient does have breakdown of the transverse arch noted.  Discussed icing regimen and home exercises.  Discussed which activities to do which wants to avoid.  Discussed with patient about proper shoe wear.  If patient has worsening pain will consider the possibility of injections.  Follow-up again in 6 to 8 weeks

## 2021-12-17 NOTE — Assessment & Plan Note (Signed)
Encourage patient at this time to consider the possibility of a knee replacement.  Patient is already met with his surgeon and will consider it in the near future.  Follow-up with me again as needed

## 2021-12-17 NOTE — Patient Instructions (Signed)
[employer] New Balance 990 or [employer] Avoid being barefoot Move forward with knee replacement See me in 2 months

## 2021-12-20 ENCOUNTER — Other Ambulatory Visit: Payer: Self-pay | Admitting: Podiatry

## 2021-12-23 ENCOUNTER — Telehealth: Payer: Self-pay | Admitting: *Deleted

## 2021-12-23 NOTE — Telephone Encounter (Signed)
° °  Pre-operative Risk Assessment    Patient Name: Andrew [name]  DOB: [birth year] MRN: [id number]      Request for Surgical Clearance    Procedure:   LEFT TOTAL KNEE ARTHROSCOPY  Date of Surgery:  Clearance 03/09/22                                 Surgeon:  DR. Gaynelle Arabian Surgeon's Group or Practice Name:  [facility] Phone number:  [phone] Fax number:  [phone] ATTN: [name]   Type of Clearance Requested:   - Medical  - Pharmacy:  Hold Rivaroxaban (Xarelto)     Type of Anesthesia:   CHOICE   Additional requests/questions:    [name]   12/23/2021, 11:08 AM

## 2021-12-23 NOTE — Telephone Encounter (Signed)
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    Procedure: Left total knee arthroscopy Date of procedure: 03/09/22  CrCl 89 mL/min Platelet count 217K  Pt had DVT in 2011 and 2017. 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 Pt on lower Xarelto dose for prevention of recurrent VTE (dose decreased in 2019 given issues with epistaxis).  Pt will require 3 day Xarelto hold for TKA. Will forward to MD for input given pt's recurrent/chronic DVT.

## 2021-12-23 NOTE — Telephone Encounter (Signed)
Clinical pharmacist to review Xarelto which the patient takes for DVT

## 2021-12-23 NOTE — Telephone Encounter (Deleted)
Patient with diagnosis of prevention of DVT/PE on Xarelto for anticoagulation.    Procedure: Left Total Knee Arthroscopy Date of procedure: 03/09/22  CrCl 89 mL/min Platelet count 217k  A 3 day hold for Xarelto should be appropriate. Will defer to procedural MD as patient is both high thrombotic risk and high procedural bleed risk.

## 2021-12-24 NOTE — Telephone Encounter (Signed)
° °  Primary Cardiologist: Sherren Mocha, MD  Chart reviewed as part of pre-operative protocol coverage. Given past medical history and time since last visit, based on ACC/AHA guidelines, Andrew [name] would be at acceptable risk for the planned procedure without further cardiovascular testing.   I called the patient on 12/24/21 and spoke with his brother who is in the car with the patient. He states patient continues to have shortness of breath but it is stable since last office visit 8/22. He reports patient is seeing pulmonologist next week. Patient is able to achieve > 4 METS activity without chest pain or SOB.  His RCRI for MACE is Class II 0.9%.  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     Procedure: Left total knee arthroscopy Date of procedure: 03/09/22   CrCl 89 mL/min Platelet count 217K   Pt had DVT in 2011 and 2017. 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 Pt on lower Xarelto dose for prevention of recurrent VTE (dose decreased in 2019 given issues with epistaxis).   Pt will require 3 day Xarelto hold for TKA. Will forward to MD for input given pt's recurrent/chronic DVT. Agree was received from Dr. Burt Knack, primary cardiologist.  Patient was advised that if he develops new symptoms prior to surgery to contact our office to arrange a follow-up appointment.  He verbalized understanding.  I will route this recommendation to the requesting party via Epic fax function and remove from pre-op pool.  Please call with questions.  Emmaline Life, NP-C    12/24/2021, 1:52 PM [name] [address] Office [phone] Fax [phone]

## 2021-12-24 NOTE — Telephone Encounter (Signed)
Reviewed. OK to hold Xarelto without bridging for surgery as requested. thx

## 2021-12-30 ENCOUNTER — Ambulatory Visit (INDEPENDENT_AMBULATORY_CARE_PROVIDER_SITE_OTHER): Payer: Medicare Other | Admitting: Pulmonary Disease

## 2021-12-30 ENCOUNTER — Encounter: Payer: Self-pay | Admitting: Pulmonary Disease

## 2021-12-30 ENCOUNTER — Other Ambulatory Visit: Payer: Self-pay

## 2021-12-30 VITALS — BP 144/78 | HR 71 | Temp 97.7°F | Wt 202.6 lb

## 2021-12-30 DIAGNOSIS — R053 Chronic cough: Secondary | ICD-10-CM

## 2021-12-30 DIAGNOSIS — Z8616 Personal history of COVID-19: Secondary | ICD-10-CM | POA: Diagnosis not present

## 2021-12-30 NOTE — Patient Instructions (Addendum)
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

## 2021-12-30 NOTE — Progress Notes (Signed)
Subjective:   PATIENT ID: Andrew [name] GENDER: male DOB: [birth year], MRN: [id number]   HPI  Chief Complaint  Patient presents with   Follow-up    Hx of covid    Reason for Visit: Follow-up  Mr. Andrew [name] is a 71 year old male with history of DVT, chronic leg swelling and HLD who presents follow-up. Brother and sister present for translation.  Synopsis He was diagnosed with COVID-19 two months ago. He continues to have persistent cough. Reports left rib cage pain.  Worsens when he coughs and lays on the left side. He reports that he is still coughing at night. Oxygen saturations will go to 90%. He reports cough is occasionally productive but mainly dry cough. He does have wheezing when laying down at night. Duonebs helped significantly. Denies chronic respiratory illnesses.   10/26/21 Since our last visit he was started on Advair and he reports his cough has improved. Has been using the Advair 1 puff twice a day for 3 days a week. Denies wheezing or shortness of breath. Still coughing some clear phlegm. Has not needed to use nebulizer. No nocturnal symtpoms. No longer having chest/rib pain.  12/30/21 Since our last visit, he has been compliant with Advair as directed and reports 90% improvement in his cough. Minimal phlegm. Does not require rescue inhaler or nebulizer. He has not been active due to left knee issues. No recurrent chest or rib pain.   Social History: <3 years. Social smoker.  Past Medical History:  Diagnosis Date   DVT (deep venous thrombosis) (HCC)    right leg in 2011; prior injury to right foot   Echocardiogram 07/2021   Echocardiogram 8/22: EF 60-65, no RWMA, mild LVH, low normal RVSF, trivial AI, mild AV sclerosis w/o AS   HLD (hyperlipidemia)    Right foot injury      Family History  Problem Relation Age of Onset   Heart disease Mother    Heart disease Father    Vascular Disease Maternal Grandfather      Social History   Occupational  History   Not on file  Tobacco Use   Smoking status: Former    Types: Cigarettes    Quit date: 07/25/1982    Years since quitting: 39.4   Smokeless tobacco: Never   Tobacco comments:    REMOTE SOCIAL HISTORY  Substance and Sexual Activity   Alcohol use: No   Drug use: No   Sexual activity: Not Currently    Allergies  Allergen Reactions   Penicillins Anaphylaxis   Influenza Vaccines     Broke out in hives and had breathing problems     Outpatient Medications Prior to Visit  Medication Sig Dispense Refill   allopurinol (ZYLOPRIM) 100 MG tablet Take 2 tablets (200 mg total) by mouth daily. 180 tablet 0   atorvastatin (LIPITOR) 40 MG tablet TAKE 1 TABLET (40 MG TOTAL) BY MOUTH DAILY AT 6 PM. 90 tablet 3   diclofenac Sodium (VOLTAREN) 1 % GEL Place onto the skin.     fluticasone-salmeterol (ADVAIR HFA) 115-21 MCG/ACT inhaler INHALE 2 PUFFS INTO THE LUNGS TWICE A DAY 12 g 5   gabapentin (NEURONTIN) 300 MG capsule Take 1 capsule (300 mg total) by mouth at bedtime. 90 capsule 3   meloxicam (MOBIC) 15 MG tablet TAKE 1 TABLET (15 MG TOTAL) BY MOUTH DAILY. 30 tablet 1   traMADol (ULTRAM) 50 MG tablet Take 1 tablet (50 mg total) by mouth every 8 (eight) hours as  needed. 40 tablet 0   triamcinolone ointment (KENALOG) 0.5 % APPLY TO AFFECTED AREA TWICE A DAY     rivaroxaban (XARELTO) 10 MG TABS tablet Take 1 tablet (10 mg total) by mouth daily. 90 tablet 3   doxycycline (VIBRA-TABS) 100 MG tablet Take 1 tablet (100 mg total) by mouth 2 (two) times daily. 42 tablet 0   No facility-administered medications prior to visit.    Review of Systems  Constitutional:  Negative for chills, diaphoresis, fever, malaise/fatigue and weight loss.  HENT:  Negative for congestion.   Respiratory:  Positive for cough. Negative for hemoptysis, sputum production, shortness of breath and wheezing.   Cardiovascular:  Negative for chest pain, palpitations and leg swelling.    Objective:   Vitals:   12/30/21  1138  BP: (!) 144/78  Pulse: 71  Temp: 97.7 F (36.5 C)  TempSrc: Oral  SpO2: 96%  Weight: 202 lb 9.6 oz (91.9 kg)   SpO2: 96 %  Physical Exam: General: Well-appearing, no acute distress HENT: Comfort, AT Eyes: EOMI, no scleral icterus Respiratory: Clear to auscultation bilaterally.  No crackles, wheezing or rales Cardiovascular: RRR, -M/R/G, no JVD Extremities:-Edema,-tenderness Neuro: AAO x4, CNII-XII grossly intact Psych: Normal mood, normal affect  Data Reviewed:  Imaging: CXR 12/12/14 - No infiltrate, edema, effusion CXR 08/05/2021 bibasilar atelectasis.  No rib fracture present  PFT: None on file  Echocardiogream: EF 60-65%. Mild LVH  Labs: CBC    Component Value Date/Time   WBC 6.6 07/15/2021 1039   WBC 8.1 04/28/2016 1113   RBC 4.59 07/15/2021 1039   RBC 4.70 04/28/2016 1113   HGB 13.6 07/15/2021 1039   HCT 41.2 07/15/2021 1039   PLT 217 07/15/2021 1039   MCV 90 07/15/2021 1039   MCH 29.6 07/15/2021 1039   MCH 29.8 04/28/2016 1113   MCHC 33.0 07/15/2021 1039   MCHC 32.1 04/28/2016 1113   RDW 12.6 07/15/2021 1039   LYMPHSABS 1.2 08/11/2013 1102   MONOABS 0.5 08/11/2013 1102   EOSABS 0.1 08/11/2013 1102   BASOSABS 0.0 08/11/2013 1102   Absolute eos 08/11/13 - 100     Assessment & Plan:   Discussion: 71 year old [place] speaking male with hx of DVT, chronic leg swelling and HLD who presents for follow-up. Significant improvement of symptoms on LABA/ICS. Planning on knee surgery. 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  Never smoker  No orders of the defined types were placed in this encounter.  No orders of the defined types were placed in this encounter.  Return in about 3 months (around 04/01/2022).  I have spent a total time of 20-minutes on the day of the appointment reviewing prior documentation, coordinating care and discussing medical diagnosis and plan with the patient/family. Past medical history, allergies, medications were reviewed. Pertinent imaging, labs and tests included in this note have been reviewed and interpreted independently by me.  [place], MD [facility] 12/30/2021 12:14 PM  Office Number [phone]

## 2021-12-31 ENCOUNTER — Encounter: Payer: Self-pay | Admitting: Specialist

## 2021-12-31 ENCOUNTER — Ambulatory Visit (INDEPENDENT_AMBULATORY_CARE_PROVIDER_SITE_OTHER): Payer: Medicare Other | Admitting: Specialist

## 2021-12-31 ENCOUNTER — Ambulatory Visit (INDEPENDENT_AMBULATORY_CARE_PROVIDER_SITE_OTHER): Payer: Medicare Other

## 2021-12-31 VITALS — BP 135/81 | HR 61 | Ht 69.0 in | Wt 195.0 lb

## 2021-12-31 DIAGNOSIS — M5412 Radiculopathy, cervical region: Secondary | ICD-10-CM | POA: Diagnosis not present

## 2021-12-31 DIAGNOSIS — M79671 Pain in right foot: Secondary | ICD-10-CM

## 2021-12-31 DIAGNOSIS — S39012A Strain of muscle, fascia and tendon of lower back, initial encounter: Secondary | ICD-10-CM | POA: Diagnosis not present

## 2021-12-31 DIAGNOSIS — E1142 Type 2 diabetes mellitus with diabetic polyneuropathy: Secondary | ICD-10-CM | POA: Diagnosis not present

## 2021-12-31 MED ORDER — CYCLOBENZAPRINE HCL 10 MG PO TABS: 10.0000 mg | ORAL_TABLET | Freq: Three times a day (TID) | ORAL | 0 refills | Status: AC | PRN

## 2021-12-31 MED ORDER — TRAMADOL HCL 50 MG PO TABS: 50.0000 mg | ORAL_TABLET | Freq: Three times a day (TID) | ORAL | 0 refills | Status: AC | PRN

## 2021-12-31 NOTE — Patient Instructions (Addendum)
Avoid frequent bending and stooping  No lifting greater than 10 lbs. May use ice or moist heat for pain. Weight loss is of benefit. Best medication for lumbar disc disease is arthritis medications but you are to not take these due to the use of  Xarelto as they can cause increase tendency to bleed spontaneously. Use ice to the left outer thoracolumbar area and muscle twice daily for 15-20 minutes. Flexeril for muscle spasm Tramadol for pain.  Exercise is important to improve your indurance and does allow people to function better inspite of back pain.  Use support stocking below knee braces, golden toe support stockings, outlet in [place].

## 2021-12-31 NOTE — Progress Notes (Signed)
Office Visit Note   Patient: Andrew [name]           Date of Birth: [birth year]           MRN: [id number] Visit Date: 12/31/2021              Requested by: Bernerd Limbo, MD [address] [place],  [place] [id number] PCP: Bernerd Limbo, MD   Assessment & Plan: Visit Diagnoses:  1. Pain in right foot   2. Strain of lumbar region, initial encounter   3. Radiculopathy, cervical region   4. DM type 2 with diabetic peripheral neuropathy (HCC)     Plan: Avoid frequent bending and stooping  No lifting greater than 10 lbs. May use ice or moist heat for pain. Weight loss is of benefit. Best medication for lumbar disc disease is arthritis medications but you are to not take these due to the use of  Xarelto as they can cause increase tendency to bleed spontaneously. Use ice to the left outer thoracolumbar area and muscle twice daily for 15-20 minutes. Flexeril for muscle spasm Tramadol for pain.  Exercise is important to improve your indurance and does allow people to function better inspite of back pain.  Use support stocking below knee braces, golden toe support stockings, outlet in [place].   Follow-Up Instructions: Return in about 4 weeks (around 01/28/2022).   Orders:  Orders Placed This Encounter  Procedures   XR Foot Complete Right   Meds ordered this encounter  Medications   cyclobenzaprine (FLEXERIL) 10 MG tablet    Sig: Take 1 tablet (10 mg total) by mouth 3 (three) times daily as needed for muscle spasms.    Dispense:  30 tablet    Refill:  0   traMADol (ULTRAM) 50 MG tablet    Sig: Take 1 tablet (50 mg total) by mouth every 8 (eight) hours as needed.    Dispense:  40 tablet    Refill:  0      Procedures: No procedures performed   Clinical Data: No additional findings.   Subjective: Chief Complaint  Patient presents with   Right Foot - Pain   Lower Back - Pain, Follow-up    71 year old male with history of lumbar spondylosis and  cervical spondylosis. Neck is better but the low back pain comes and goes. He is doing exercises and about 2-3 days ago and now he is having left sided pain more than right. And radiates into the left shoulder. Sleeping is okay. Mainly left paralumbar mid scapula to lateral scapula line to the left at the L1-T12 level   Review of Systems   Objective: Vital Signs: BP 135/81 (BP Location: Left Arm, Patient Position: Sitting)    Pulse 61    Ht 5\' 9"  (1.753 m)    Wt 195 lb (88.5 kg)    BMI 28.80 kg/m   Physical Exam  Ortho Exam  Specialty Comments:  No specialty comments available.  Imaging: XR Foot Complete Right  Result Date: 12/31/2021 Right foot with mild degenerative joint changes in the great toe MCP joint with flattening of the distal metatarsal Head and mild increased sclerosiss.     PMFS History: Patient Active Problem List   Diagnosis Date Noted   Loss of transverse plantar arch of right foot 12/17/2021   Chronic cough 10/28/2021   History of COVID-19 10/28/2021   Effusion of left knee 09/09/2021   History of deep vein thrombosis (DVT) of lower extremity  05/13/2021   Degenerative cervical disc 01/16/2021   Right shoulder pain 01/16/2021   Degenerative arthritis of knee 05/28/2020   Spondylosis without myelopathy or radiculopathy, lumbar region 10/11/2016   Migraines 06/08/2016   Nuclear sclerotic cataract of both eyes 06/08/2016   Presbyopia 06/08/2016   Hypermetropia of both eyes 06/08/2016   Long term current use of anticoagulant 06/02/2016   Recurrent right inguinal hernia 12/05/2014   Chronic venous insufficiency 11/28/2014   Bradycardia 10/11/2012   Right leg DVT ([place]) 10/03/2012   HLD (hyperlipidemia) 07/25/2012   Past Medical History:  Diagnosis Date   DVT (deep venous thrombosis) (HCC)    right leg in 2011; prior injury to right foot   Echocardiogram 07/2021   Echocardiogram 8/22: EF 60-65, no RWMA, mild LVH, low normal RVSF, trivial AI, mild AV  sclerosis w/o AS   HLD (hyperlipidemia)    Right foot injury     Family History  Problem Relation Age of Onset   Heart disease Mother    Heart disease Father    Vascular Disease Maternal Grandfather     History reviewed. No pertinent surgical history. Social History   Occupational History   Not on file  Tobacco Use   Smoking status: Former    Types: Cigarettes    Quit date: 07/25/1982    Years since quitting: 39.4   Smokeless tobacco: Never   Tobacco comments:    REMOTE SOCIAL HISTORY  Substance and Sexual Activity   Alcohol use: No   Drug use: No   Sexual activity: Not Currently

## 2022-01-02 ENCOUNTER — Other Ambulatory Visit: Payer: Self-pay | Admitting: Family Medicine

## 2022-01-05 ENCOUNTER — Encounter: Payer: Self-pay | Admitting: Pulmonary Disease

## 2022-02-03 ENCOUNTER — Ambulatory Visit: Payer: Medicare Other | Admitting: Specialist

## 2022-02-04 ENCOUNTER — Ambulatory Visit (INDEPENDENT_AMBULATORY_CARE_PROVIDER_SITE_OTHER): Payer: Medicare Other | Admitting: Specialist

## 2022-02-04 ENCOUNTER — Ambulatory Visit (INDEPENDENT_AMBULATORY_CARE_PROVIDER_SITE_OTHER): Payer: Medicare Other

## 2022-02-04 ENCOUNTER — Other Ambulatory Visit: Payer: Self-pay

## 2022-02-04 ENCOUNTER — Encounter: Payer: Self-pay | Admitting: Specialist

## 2022-02-04 VITALS — BP 159/79 | HR 52 | Ht 70.0 in | Wt 197.0 lb

## 2022-02-04 DIAGNOSIS — E1142 Type 2 diabetes mellitus with diabetic polyneuropathy: Secondary | ICD-10-CM | POA: Diagnosis not present

## 2022-02-04 DIAGNOSIS — M79671 Pain in right foot: Secondary | ICD-10-CM

## 2022-02-04 DIAGNOSIS — M25562 Pain in left knee: Secondary | ICD-10-CM

## 2022-02-04 DIAGNOSIS — M47816 Spondylosis without myelopathy or radiculopathy, lumbar region: Secondary | ICD-10-CM

## 2022-02-04 DIAGNOSIS — M1712 Unilateral primary osteoarthritis, left knee: Secondary | ICD-10-CM

## 2022-02-04 DIAGNOSIS — M4726 Other spondylosis with radiculopathy, lumbar region: Secondary | ICD-10-CM

## 2022-02-04 NOTE — Progress Notes (Signed)
Office Visit Note   Patient: Andrew [name]           Date of Birth: 1951-09-03           MRN: [id number] Visit Date: 02/04/2022              Requested by: Bernerd Limbo, MD [place],  [place] [address] PCP: Bernerd Limbo, MD   Assessment & Plan: Visit Diagnoses:  1. Left knee pain, unspecified chronicity   2. Unilateral primary osteoarthritis, left knee   3. DM type 2 with diabetic peripheral neuropathy (HCC)   4. Spondylosis without myelopathy or radiculopathy, lumbar region   5. Other spondylosis with radiculopathy, lumbar region   6. Pain in right foot     Plan:  Avoid prolong standing and walking. Exercise with the knee as tolerated,stationary bike or pool walking . Use ice to the right knee  30 min on and 15 min off as needed. Tramadol for pain . Take voltaren gel for antiinflamatory affect.  Surgery recommended is a left total knee replacement. Risks of surgery include risk of infection 1 in 100. Risk of bleeding  Less than 1 5 chance of needing a blood transfusion.   Follow-Up Instructions: No follow-ups on file.   Orders:  Orders Placed This Encounter  Procedures   XR KNEE 3 VIEW LEFT   No orders of the defined types were placed in this encounter.     Procedures: No procedures performed   Clinical Data: No additional findings.   Subjective: Chief Complaint  Patient presents with   Left Knee - Pain    States that he had surgery on it last year with Dr. Lorre Nick, and states that it got infected really bad and now he is facing a knee replacement    70 year old male with history of left knee pain, had a history of walking for exercise and shopping. Had increasing mechanical pain left knee and recurring effusion and was popping and catching. He took tylenol and was able to keep on going. He had arthroscopy by Dr. Ronnie Derby and did well for 2-3 weeks then had increased pain and fever and chills and was treated with  Repeated  aspiration and then oral antibiotics for almost 4 weeks. Since then the swelling has decreased but does have some recurring swelling. He will elevated and apply ice. Had history or right leg DVTs post right foot injury when mowing the yard and the mower ran over the right foot with lacerations of the right foot and right calf.   Review of Systems  Constitutional: Negative.   HENT: Negative.    Eyes: Negative.   Respiratory: Negative.    Cardiovascular: Negative.   Gastrointestinal: Negative.   Endocrine: Negative.   Genitourinary: Negative.   Musculoskeletal: Negative.   Skin: Negative.   Allergic/Immunologic: Negative.   Neurological: Negative.   Hematological: Negative.   Psychiatric/Behavioral: Negative.      Objective: Vital Signs: BP (!) 159/79 (BP Location: Left Arm, Patient Position: Sitting)    Pulse (!) 52    Ht 5\' 10"  (1.778 m)    Wt 197 lb (89.4 kg)    BMI 28.27 kg/m   Physical Exam Constitutional:      Appearance: He is well-developed.  HENT:     Head: Normocephalic and atraumatic.  Eyes:     Pupils: Pupils are equal, round, and reactive to light.  Pulmonary:     Effort: Pulmonary effort is normal.  Breath sounds: Normal breath sounds.  Abdominal:     General: Bowel sounds are normal.     Palpations: Abdomen is soft.  Musculoskeletal:     Cervical back: Normal range of motion and neck supple.     Right knee: No effusion.     Instability Tests: Medial McMurray test negative.     Left knee: No effusion.     Instability Tests: Medial McMurray test positive.  Skin:    General: Skin is warm and dry.  Neurological:     Mental Status: He is alert and oriented to person, place, and time.  Psychiatric:        Behavior: Behavior normal.        Thought Content: Thought content normal.        Judgment: Judgment normal.    Right Knee Exam   Muscle Strength  The patient has normal right knee strength.  Tenderness  The patient is experiencing tenderness in  the medial joint line and medial retinaculum.  Range of Motion  Extension:  0 normal  Flexion:  120 normal   Tests  McMurray:  Medial - negative  Varus: negative Valgus: negative Lachman:  Anterior - negative     Patellar apprehension: negative  Other  Erythema: absent Scars: absent Sensation: normal Pulse: present Swelling: none Effusion: no effusion present   Left Knee Exam   Range of Motion  Extension:  -10  Flexion:  120   Tests  McMurray:  Medial - positive  Varus: positive  Lachman:  Anterior - negative    Posterior - negative Drawer:  Anterior - negative     Posterior - negative Patellar apprehension: 1+  Other  Erythema: absent Scars: present Sensation: normal Pulse: present Swelling: mild Effusion: no effusion present     Specialty Comments:  No specialty comments available.  Imaging: No results found.   PMFS History: Patient Active Problem List   Diagnosis Date Noted   Loss of transverse plantar arch of right foot 12/17/2021   Chronic cough 10/28/2021   History of COVID-19 10/28/2021   Effusion of left knee 09/09/2021   History of deep vein thrombosis (DVT) of lower extremity 05/13/2021   Degenerative cervical disc 01/16/2021   Right shoulder pain 01/16/2021   Degenerative arthritis of knee 05/28/2020   Spondylosis without myelopathy or radiculopathy, lumbar region 10/11/2016   Migraines 06/08/2016   Nuclear sclerotic cataract of both eyes 06/08/2016   Presbyopia 06/08/2016   Hypermetropia of both eyes 06/08/2016   Long term current use of anticoagulant 06/02/2016   Recurrent right inguinal hernia 12/05/2014   Chronic venous insufficiency 11/28/2014   Bradycardia 10/11/2012   Right leg DVT ([place]) 10/03/2012   HLD (hyperlipidemia) 07/25/2012   Past Medical History:  Diagnosis Date   DVT (deep venous thrombosis) (HCC)    right leg in 2011; prior injury to right foot   Echocardiogram 07/2021   Echocardiogram 8/22: EF 60-65, no  RWMA, mild LVH, low normal RVSF, trivial AI, mild AV sclerosis w/o AS   HLD (hyperlipidemia)    Right foot injury     Family History  Problem Relation Age of Onset   Heart disease Mother    Heart disease Father    Vascular Disease Maternal Grandfather     History reviewed. No pertinent surgical history. Social History   Occupational History   Not on file  Tobacco Use   Smoking status: Former    Types: Cigarettes    Quit date: 07/25/1982  Years since quitting: 39.5   Smokeless tobacco: Never   Tobacco comments:    REMOTE SOCIAL HISTORY  Substance and Sexual Activity   Alcohol use: No   Drug use: No   Sexual activity: Not Currently

## 2022-02-04 NOTE — Patient Instructions (Addendum)
°  Plan:  Avoid prolong standing and walking. Exercise with the knee as tolerated,stationary bike or pool walking . Use ice to the right knee  30 min on and 15 min off as needed. Tramadol for pain . Take voltaren gel for antiinflamatory affect.  Surgery recommended is a left total knee replacement. Copies of radiographs to be provided patient so that  He may present to Dr. Ricki Rodriguez while undergoing evaluation for TKR ( Total Knee Replacement) Risks of surgery include risk of infection 1 in 100. Risk of bleeding  Less than 1 % chance of needing a blood transfusion.

## 2022-02-17 ENCOUNTER — Ambulatory Visit: Payer: Medicare Other | Admitting: Family Medicine

## 2022-02-18 ENCOUNTER — Ambulatory Visit (INDEPENDENT_AMBULATORY_CARE_PROVIDER_SITE_OTHER): Payer: Medicare Other | Admitting: Pulmonary Disease

## 2022-02-18 ENCOUNTER — Other Ambulatory Visit: Payer: Self-pay

## 2022-02-18 ENCOUNTER — Ambulatory Visit (INDEPENDENT_AMBULATORY_CARE_PROVIDER_SITE_OTHER): Payer: Medicare Other

## 2022-02-18 ENCOUNTER — Telehealth: Payer: Self-pay | Admitting: Pulmonary Disease

## 2022-02-18 ENCOUNTER — Encounter: Payer: Self-pay | Admitting: Pulmonary Disease

## 2022-02-18 ENCOUNTER — Other Ambulatory Visit: Payer: Self-pay | Admitting: Pulmonary Disease

## 2022-02-18 VITALS — BP 116/70 | HR 67 | Ht 70.0 in | Wt 198.4 lb

## 2022-02-18 DIAGNOSIS — R053 Chronic cough: Secondary | ICD-10-CM

## 2022-02-18 DIAGNOSIS — J209 Acute bronchitis, unspecified: Secondary | ICD-10-CM | POA: Diagnosis not present

## 2022-02-18 DIAGNOSIS — U099 Post covid-19 condition, unspecified: Secondary | ICD-10-CM | POA: Diagnosis not present

## 2022-02-18 MED ORDER — PREDNISONE 10 MG PO TABS: ORAL_TABLET | ORAL | 0 refills | Status: AC

## 2022-02-18 MED ORDER — AZITHROMYCIN 250 MG PO TABS: ORAL_TABLET | ORAL | 0 refills | Status: DC

## 2022-02-18 NOTE — Patient Instructions (Addendum)
COVID long hauler manifesting as cough ?Acute on chronic bronchitis/asthma-like features ?START prednisone taper ?START azithromycin ? ?Follow-up with me at next visit  ?

## 2022-02-18 NOTE — Telephone Encounter (Signed)
Spoke with the pt's sister  ?Pt is scheduled for 10 am today and she wants him to have a cxr done prior for eval of cough  ?I advised will address at ov, arrive 20 min early in case JE wants cxr ?

## 2022-02-18 NOTE — Progress Notes (Signed)
? ? ?Subjective:  ? ?PATIENT ID: Andrew [name]: male DOB: 07-Oct-1951, MRN: [id number] ? ? ?HPI ? ?Chief Complaint  ?Patient presents with  ? Follow-up  ?  Congestion not sleep  ? ? ?Reason for Visit: Follow-up ? ?Mr. Andrew [name] is a 71 year old male with history of DVT, chronic leg swelling and HLD who presents follow-up. Brother and sister present for translation. ? ?Synopsis ?He was diagnosed with COVID-19 in July 2022. He continues to have persistent cough. Reports left rib cage pain.  Worsens when he coughs and lays on the left side. He reports that he is still coughing at night. Oxygen saturations will go to 90%. He reports cough is occasionally productive but mainly dry cough. He does have wheezing when laying down at night. Duonebs helped significantly. Denies chronic respiratory illnesses.  ? ?10/26/21 ?Since our last visit he was started on Advair and he reports his cough has improved. Has been using the Advair 1 puff twice a day for 3 days a week. Denies wheezing or shortness of breath. Still coughing some clear phlegm. Has not needed to use nebulizer. No nocturnal symtpoms. No longer having chest/rib pain. ? ?12/30/21 ?Since our last visit, he has been compliant with Advair as directed and reports 90% improvement in his cough. Minimal phlegm. Does not require rescue inhaler or nebulizer. He has not been active due to left knee issues. No recurrent chest or rib pain.  ? ?02/18/22 ?He reports one week of worsening cough with productive cough. Using duonebs with some improvement. Wheezing and shortness of breath. Denies fevers, chills.  ? ?Social History: ?<3 years. Social smoker. ? ?Past Medical History:  ?Diagnosis Date  ? DVT (deep venous thrombosis) (Andrew [name])   ? right leg in 2011; prior injury to right foot  ? Echocardiogram 07/2021  ? Echocardiogram 8/22: EF 60-65, no RWMA, mild LVH, low normal RVSF, trivial AI, mild AV sclerosis w/o AS  ? HLD (hyperlipidemia)   ? Right foot injury   ?  ? ?Family  History  ?Problem Relation Age of Onset  ? Heart disease Mother   ? Heart disease Father   ? Vascular Disease Maternal Grandfather   ?  ? ?Social History  ? ?Occupational History  ? Not on file  ?Tobacco Use  ? Smoking status: Former  ?  Types: Cigarettes  ?  Quit date: 07/25/1982  ?  Years since quitting: 39.5  ? Smokeless tobacco: Never  ? Tobacco comments:  ?  REMOTE SOCIAL HISTORY  ?Substance and Sexual Activity  ? Alcohol use: No  ? Drug use: No  ? Sexual activity: Not Currently  ? ? ?Allergies  ?Allergen Reactions  ? Penicillins Anaphylaxis  ? Influenza Vaccines   ?  Broke out in hives and had breathing problems  ?  ? ?Outpatient Medications Prior to Visit  ?Medication Sig Dispense Refill  ? allopurinol (ZYLOPRIM) 100 MG tablet TAKE 2 TABLETS BY MOUTH EVERY DAY 180 tablet 0  ? atorvastatin (LIPITOR) 40 MG tablet TAKE 1 TABLET (40 MG TOTAL) BY MOUTH DAILY AT 6 PM. 90 tablet 3  ? cyclobenzaprine (FLEXERIL) 10 MG tablet Take 1 tablet (10 mg total) by mouth 3 (three) times daily as needed for muscle spasms. 30 tablet 0  ? diclofenac Sodium (VOLTAREN) 1 % GEL Place onto the skin.    ? fluticasone-salmeterol (ADVAIR HFA) 115-21 MCG/ACT inhaler INHALE 2 PUFFS INTO THE LUNGS TWICE A DAY 12 g 5  ? meloxicam (MOBIC) 15 MG tablet TAKE  1 TABLET (15 MG TOTAL) BY MOUTH DAILY. 30 tablet 1  ? traMADol (ULTRAM) 50 MG tablet Take 1 tablet (50 mg total) by mouth every 8 (eight) hours as needed. 40 tablet 0  ? gabapentin (NEURONTIN) 300 MG capsule Take 1 capsule (300 mg total) by mouth at bedtime. (Patient not taking: Reported on 02/18/2022) 90 capsule 3  ? rivaroxaban (XARELTO) 10 MG TABS tablet Take 1 tablet (10 mg total) by mouth daily. 90 tablet 3  ? triamcinolone ointment (KENALOG) 0.5 % APPLY TO AFFECTED AREA TWICE A DAY (Patient not taking: Reported on 02/18/2022)    ? ?No facility-administered medications prior to visit.  ? ? ?Review of Systems  ?Constitutional:  Negative for chills, diaphoresis, fever, malaise/fatigue and  weight loss.  ?HENT:  Negative for congestion.   ?Respiratory:  Positive for cough and wheezing. Negative for hemoptysis, sputum production and shortness of breath.   ?Cardiovascular:  Negative for chest pain, palpitations and leg swelling.  ? ? ?Objective:  ? ?Vitals:  ? 02/18/22 1002  ?BP: 116/70  ?Pulse: 67  ?SpO2: 98%  ?Weight: 198 lb 6.4 oz (90 kg)  ?Height: '5\' 10"'$  (1.778 m)  ? ?SpO2: 98 % ?O2 Device: None (Room air) ? ?Physical Exam: ?General: Well-appearing, no acute distress ?HENT: [place], AT ?Eyes: EOMI, no scleral icterus ?Respiratory: Clear to auscultation bilaterally.  No crackles, wheezing or rales ?Cardiovascular: RRR, -M/R/G, no JVD ?Extremities:-Edema,-tenderness ?Neuro: AAO x4, CNII-XII grossly intact ?Psych: Normal mood, normal affect ? ?Data Reviewed: ? ?Imaging: ?CXR 12/12/14 - No infiltrate, edema, effusion ?CXR 08/05/2021 bibasilar atelectasis.  No rib fracture present ?CXR 02/18/22 - Normal CXR. No infiltrate, edema or effusion ? ?PFT: ?None on file ? ?Echocardiogream: ?EF 60-65%. Mild LVH ? ?Labs: ?CBC ?   ?Component Value Date/Time  ? WBC 6.6 07/15/2021 1039  ? WBC 8.1 04/28/2016 1113  ? RBC 4.59 07/15/2021 1039  ? RBC 4.70 04/28/2016 1113  ? HGB 13.6 07/15/2021 1039  ? HCT 41.2 07/15/2021 1039  ? PLT 217 07/15/2021 1039  ? MCV 90 07/15/2021 1039  ? MCH 29.6 07/15/2021 1039  ? MCH 29.8 04/28/2016 1113  ? MCHC 33.0 07/15/2021 1039  ? MCHC 32.1 04/28/2016 1113  ? RDW 12.6 07/15/2021 1039  ? LYMPHSABS 1.2 08/11/2013 1102  ? MONOABS 0.5 08/11/2013 1102  ? EOSABS 0.1 08/11/2013 1102  ? BASOSABS 0.0 08/11/2013 1102  ? ?Absolute eos 08/11/13 - 100 ? ?   ?Assessment & Plan:  ? ?Discussion: ?71 year old [place] speaking male with history of DVT, chronic leg swelling, hyperlipidemia and chronic left knee pain/swelling who presents for follow-up. Presents today with acute asthma-like flare. ? ?COVID long hauler manifesting as cough ?Acute on chronic bronchitis/asthma-like features ?--CONTINUE Advair 115-21  mcg TWO puffs TWICE a day ?--OK to use ONE puff TWICE if symptoms are controlled ?--START prednisone taper ?--START azithromycin ? ?Follow-up in April 2023 ? ? ?Health Maintenance ?Immunization History  ?Administered Date(s) Administered  ? Influenza Split 12/13/2002  ? PFIZER Comirnaty(Gray Top)Covid-19 Tri-Sucrose Vaccine 07/09/2021  ? PFIZER(Purple Top)SARS-COV-2 Vaccination 02/14/2020, 03/11/2020, 09/26/2020  ? PPD Test 12/13/2010  ? Pneumococcal Conjugate-13 12/15/2017  ? Pneumococcal Polysaccharide-23 07/10/2019  ? Tdap 12/13/2008  ? ?CT Lung Screen-not qualified.  Never smoker ? ?No orders of the defined types were placed in this encounter. ? ?Meds ordered this encounter  ?Medications  ? predniSONE (DELTASONE) 10 MG tablet  ?  Sig: Take 4 tablets (40 mg total) by mouth daily with breakfast for 2 days, THEN 3 tablets (30  mg total) daily with breakfast for 2 days, THEN 2 tablets (20 mg total) daily with breakfast for 2 days, THEN 1 tablet (10 mg total) daily with breakfast for 2 days.  ?  Dispense:  20 tablet  ?  Refill:  0  ? azithromycin (ZITHROMAX) 250 MG tablet  ?  Sig: Take two tablets on day, followed by one tablet x 4 days  ?  Dispense:  6 tablet  ?  Refill:  0  ? ?No follow-ups on file. ? ?I have spent a total time of 35-minutes on the day of the appointment reviewing prior documentation, coordinating care and discussing medical diagnosis and plan with the patient/family. Past medical history, allergies, medications were reviewed. Pertinent imaging, labs and tests included in this note have been reviewed and interpreted independently by me. ? ?Brienne Liguori Rodman Pickle, MD ?[facility] ?02/18/2022 4:53 PM  ?Office Number [phone] ? ? ? ?

## 2022-02-20 ENCOUNTER — Other Ambulatory Visit: Payer: Self-pay | Admitting: Podiatry

## 2022-02-22 ENCOUNTER — Other Ambulatory Visit (HOSPITAL_COMMUNITY): Payer: Self-pay

## 2022-02-22 ENCOUNTER — Telehealth: Payer: Self-pay | Admitting: Pharmacy Technician

## 2022-02-22 NOTE — Telephone Encounter (Incomplete)
Patient Advocate Encounter  Received a fax regarding Prior Authorization from [place] [employer]) for ADVAIR HFA 115MCG. Authorization has been DENIED because PT HAS NOT TRIED FAILED TWO OF THE FORMULARY ALTERNATIVES, SYMBICORT AND BREO.    Received notification from [facility]) that prior authorization for ADVAIR [facility] 115MCG is required.   PA submitted on 3.13.23 Key [id number] Status is pending   [facility] will continue to follow  [place], CPhT Patient Advocate Phone:[phone]

## 2022-02-22 NOTE — Telephone Encounter (Signed)
Patient Advocate Encounter ? ?Received a fax regarding Prior Authorization from [place] [employer]) for [place] 115MCG. Authorization has been DENIED because PT HAS NOT TRIED FAILED TWO OF THE FORMULARY ALTERNATIVES, SYMBICORT AND BREO. ? ? ? ?Received notification from [facility]) that prior authorization for ADVAIR [facility] 115MCG is required. ?  ?PA submitted on 3.13.23 ?Key [id number] ?Status is pending ?  ?[facility] will continue to follow ? ?Giles Currie R Irena Gaydos, CPhT ?Patient Advocate ?Phone:[phone] ?[phone] ? ?

## 2022-02-25 MED ORDER — FLUTICASONE FUROATE-VILANTEROL 100-25 MCG/ACT IN AEPB
1.0000 | INHALATION_SPRAY | Freq: Every day | RESPIRATORY_TRACT | 3 refills | Status: DC
Start: 2022-02-25 — End: 2022-06-28

## 2022-02-25 NOTE — Telephone Encounter (Signed)
Switch patient from Advair HFA to Breo 100-25 mcg ONE puff ONCE a day. ?Please call patient for update. He will need his brother to translate [place]) ? ? ?

## 2022-03-01 ENCOUNTER — Ambulatory Visit (INDEPENDENT_AMBULATORY_CARE_PROVIDER_SITE_OTHER): Payer: Medicare Other | Admitting: Neurology

## 2022-03-01 ENCOUNTER — Encounter: Payer: Self-pay | Admitting: Neurology

## 2022-03-01 VITALS — BP 125/81 | HR 65 | Ht 70.0 in | Wt 197.2 lb

## 2022-03-01 DIAGNOSIS — M79671 Pain in right foot: Secondary | ICD-10-CM | POA: Diagnosis not present

## 2022-03-01 NOTE — Progress Notes (Signed)
?[facility] ? ? ? ?Provider:  Dr Jaynee Eagles ?Requesting Provider: Jessy Oto, MD ?Primary Care Provider:  Bernerd Limbo, MD ? ?CC:  right foot pain ? ?HPI:  Andrew [name] is a 71 y.o. male here as requested by Jessy Oto, MD for strain of lumbar region. 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 His spine is fine, he had some therapy on the low back ablation and L4-L5 and now his back feels better. Only the right foot. His brother is here and acts as interpreter. Started around the time his back was hurting. The arch and forefoot when he walks he feels it is shards of glass, not constant but can be painful, hurts the most when walking on it when he puts the weigh on it especially in the morning.Dr newton injected him and it has been very helpful. I reviewed zachary smith's notes who stated "Patient's right foot does have significant breakdown of the transverse arch.  Patient does have mild swelling over the midfoot.  Patient does have a positive squeeze test." No other focal neurologic deficits, associated symptoms, inciting events or modifiable factors. Brother is here, provides much informationand interpretation. No other focal neurologic deficits, associated symptoms, inciting events or modifiable factors. ? ?Reviewed notes, labs and imaging from outside physicians, which showed: ? ?08/26/2021: Impression: reviewed data ?Essentially NORMAL electrodiagnostic study of the right lower limb.  The decreased amplitudes are technical artifact and are due to significant edema of the lower leg do to deep vein thrombosis history.  There is no significant electrodiagnostic evidence of nerve entrapment, lumbosacral plexopathy or lumbar radiculopathy.  As you know, purely sensory or demyelinating radiculopathies and chemical radiculitis may not be detected with this particular  electrodiagnostic study. ? ?XR foot 12/31/2021: Right foot with mild degenerative joint changes in the great toe MCP joint  ?with flattening of the distal metatarsal Head and mild increased  ?sclerosiss.  ? ?Uric acid 2019 normal, reviewed ? ?Review of Systems: ?Patient complains of symptoms per HPI as well as the following symptoms migraines. Pertinent negatives and positives per HPI. All others negative. ? ? ?Social History  ? ?Socioeconomic History  ? Marital status: Divorced  ?  Spouse name: Not on file  ? Number of children: Not on file  ? Years of education: Not on file  ? Highest education level: Not on file  ?Occupational History  ? Not on file  ?Tobacco Use  ? Smoking status: Former  ?  Types: Cigarettes  ?  Quit date: 07/25/1982  ?  Years since quitting: 39.6  ? Smokeless tobacco: Never  ? Tobacco comments:  ?  REMOTE SOCIAL HISTORY  ?Substance and Sexual Activity  ? Alcohol use: No  ? Drug use: No  ? Sexual activity: Not Currently  ?Other Topics Concern  ? Not on file  ?Social History Narrative  ? Not on file  ? ?Social Determinants of Health  ? ?Financial Resource Strain: Not on file  ?Food Insecurity: Not on file  ?Transportation Needs: Not on file  ?Physical Activity: Not on file  ?Stress: Not on file  ?Social Connections: Not on file  ?Intimate Partner Violence: Not on file  ? ? ?Family History  ?Problem Relation Age of Onset  ? Heart disease Mother   ? Heart disease Father   ? Vascular Disease Maternal Grandfather   ? Neuropathy Neg Hx   ? ? ?Past Medical  History:  ?Diagnosis Date  ? DVT (deep venous thrombosis) ([place])   ? right leg in 2011; prior injury to right foot  ? Echocardiogram 07/2021  ? Echocardiogram 8/22: EF 60-65, no RWMA, mild LVH, low normal RVSF, trivial AI, mild AV sclerosis w/o AS  ? HLD (hyperlipidemia)   ? Right foot injury   ? ? ?Patient Active Problem List  ? Diagnosis Date Noted  ? Acute bronchitis 02/18/2022  ? Loss of transverse plantar arch of right foot 12/17/2021  ? COVID-19  long hauler manifesting chronic cough 10/28/2021  ? History of COVID-19 10/28/2021  ? Effusion of left knee 09/09/2021  ? History of deep vein thrombosis (DVT) of lower extremity 05/13/2021  ? Degenerative cervical disc 01/16/2021  ? Right shoulder pain 01/16/2021  ? Degenerative arthritis of knee 05/28/2020  ? Spondylosis without myelopathy or radiculopathy, lumbar region 10/11/2016  ? Migraines 06/08/2016  ? Nuclear sclerotic cataract of both eyes 06/08/2016  ? Presbyopia 06/08/2016  ? Hypermetropia of both eyes 06/08/2016  ? Long term current use of anticoagulant 06/02/2016  ? Recurrent right inguinal hernia 12/05/2014  ? Chronic venous insufficiency 11/28/2014  ? Bradycardia 10/11/2012  ? Right leg DVT ([place]) 10/03/2012  ? HLD (hyperlipidemia) 07/25/2012  ? ? ?Past Surgical History:  ?Procedure Laterality Date  ? NO PAST SURGERIES    ? ? ?Current Outpatient Medications  ?Medication Sig Dispense Refill  ? allopurinol (ZYLOPRIM) 100 MG tablet TAKE 2 TABLETS BY MOUTH EVERY DAY 180 tablet 0  ? atorvastatin (LIPITOR) 40 MG tablet TAKE 1 TABLET (40 MG TOTAL) BY MOUTH DAILY AT 6 PM. 90 tablet 3  ? cyclobenzaprine (FLEXERIL) 10 MG tablet Take 1 tablet (10 mg total) by mouth 3 (three) times daily as needed for muscle spasms. 30 tablet 0  ? diclofenac Sodium (VOLTAREN) 1 % GEL Place onto the skin.    ? fluticasone furoate-vilanterol (BREO ELLIPTA) 100-25 MCG/ACT AEPB Inhale 1 puff into the lungs daily. 60 each 3  ? fluticasone-salmeterol (ADVAIR HFA) 115-21 MCG/ACT inhaler INHALE 2 PUFFS INTO THE LUNGS TWICE A DAY 12 g 5  ? meloxicam (MOBIC) 15 MG tablet TAKE 1 TABLET (15 MG TOTAL) BY MOUTH DAILY. 30 tablet 1  ? traMADol (ULTRAM) 50 MG tablet Take 1 tablet (50 mg total) by mouth every 8 (eight) hours as needed. 40 tablet 0  ? rivaroxaban (XARELTO) 10 MG TABS tablet Take 1 tablet (10 mg total) by mouth daily. 90 tablet 3  ? ?No current facility-administered medications for this visit.  ? ? ?Allergies as of 03/01/2022 -  Review Complete 03/01/2022  ?Allergen Reaction Noted  ? Penicillins Anaphylaxis 02/23/2018  ? Influenza vaccines  01/16/2013  ? ? ?Vitals: ?BP 125/81   Pulse 65   Ht '5\' 10"'$  (1.778 m)   Wt 197 lb 3.2 oz (89.4 kg)   BMI 28.30 kg/m?  ?Last Weight:  ?Wt Readings from Last 1 Encounters:  ?03/01/22 197 lb 3.2 oz (89.4 kg)  ? ?Last Height:   ?Ht Readings from Last 1 Encounters:  ?03/01/22 '5\' 10"'$  (1.778 m)  ? ? ? ?Physical exam: ?Exam: ?Gen: NAD, conversant, well nourised,  well groomed                     ?CV: RRR, no MRG. No Carotid Bruits. No peripheral edema, warm, nontender ?Eyes: Conjunctivae clear without exudates or hemorrhage ?MSK: Pain on palpation of the right foot arch.  ? ?Neuro: ?Detailed Neurologic Exam ? ?Speech: ?   Speech  is normal; fluent and spontaneous with normal comprehension.  ?Cognition: ?   The patient is oriented to person, place, and time;  ?   recent and remote memory intact;  ?   language fluent;  ?   normal attention, concentration,  ?   fund of knowledge ?Cranial Nerves: ?   The pupils are equal, round, and reactive to light. Attempted. Pupils too small to visualize fundi. Visual fields are full to finger confrontation. Extraocular movements are intact. Trigeminal sensation is intact and the muscles of mastication are normal. The face is symmetric. The palate elevates in the midline. Hearing intact. Voice is normal. Shoulder shrug is normal. The tongue has normal motion without fasciculations.  ? ?Coordination: ?   Normal  ? ?Gait: ?   Antalgic due to foot pain  ? ?Motor Observation: ?   No asymmetry, no atrophy, and no involuntary movements noted. ?Tone: ?   Normal muscle tone.   ? ?Posture: ?   Posture is normal. normal erect ?   ?Strength: ?   Strength is V/V in the upper and lower limbs.  ?    ?Sensation: intact to LT, pin prick, temp, vibration and proprioception distally toes even on the bottom of his feet ?    ?Reflex Exam: ? ?DTR's: ?   1+ AJs,patellars,biceps. Deep tendon  reflexes in the upper and lower extremities are symmetrical bilaterally.  Left knee pain possibly slightly decreased in left patellar but was able to get a refles.  ?Toes: ?   The toes are downgoing bilaterally.   ?Clonus: ?

## 2022-03-02 ENCOUNTER — Telehealth: Payer: Self-pay | Admitting: Pulmonary Disease

## 2022-03-02 NOTE — Telephone Encounter (Signed)
Fax received from Dr. Gaynelle Arabian with [facility] to perform a Left total knee arthroplasty on patient.  Patient needs surgery clearance. Patient was seen on 02/18/2022 and is scheduled for OV on 04/01/2022. Office protocol is a risk assessment can be sent to surgeon if patient has been seen in 60 days or less. PATIENT IS CURRENTLY SCHEDULED FOR SURGERY ON 03/09/2022 ? ?Sending to Dr. Loanne Drilling for risk assessment or recommendations if patient needs to be seen in office prior to surgical procedure.  ? ?

## 2022-03-02 NOTE — Telephone Encounter (Signed)
OV notes and clearance letter have been faxed back to [facility]. Nothing further needed at this time.  ?

## 2022-03-10 ENCOUNTER — Encounter: Payer: Self-pay | Admitting: Specialist

## 2022-03-10 ENCOUNTER — Other Ambulatory Visit: Payer: Self-pay | Admitting: Radiology

## 2022-03-10 MED ORDER — TIZANIDINE HCL 4 MG PO TABS: 4.0000 mg | ORAL_TABLET | Freq: Once | ORAL | 1 refills | Status: DC

## 2022-03-12 ENCOUNTER — Ambulatory Visit: Payer: Medicare Other

## 2022-03-15 ENCOUNTER — Ambulatory Visit: Payer: Medicare Other | Admitting: Physical Therapy

## 2022-03-15 NOTE — Therapy (Signed)
?OUTPATIENT PHYSICAL THERAPY LOWER EXTREMITY EVALUATION ? ? ?Patient Name: Andrew [name] ?MRN: [id number] ?DOB:[birth year], 71 y.o., male ?Today's Date: 03/16/2022 ? ? PT End of Session - 03/16/22 1711   ? ? Visit Number 1   ? Number of Visits 12   ? Date for PT Re-Evaluation 05/11/22   ? Authorization Type Medicare;  12 visits per Dr. Wynelle Link   ? PT Start Time [phone]   late  ? PT Stop Time 0930   ? PT Time Calculation (min) 38 min   ? Activity Tolerance Patient tolerated treatment well   ? ?  ?  ? ?  ? ? ?Past Medical History:  ?Diagnosis Date  ? DVT (deep venous thrombosis) ([place])   ? right leg in 2011; prior injury to right foot  ? Echocardiogram 07/2021  ? Echocardiogram 8/22: EF 60-65, no RWMA, mild LVH, low normal RVSF, trivial AI, mild AV sclerosis w/o AS  ? HLD (hyperlipidemia)   ? Right foot injury   ? ?Past Surgical History:  ?Procedure Laterality Date  ? NO PAST SURGERIES    ? ?Patient Active Problem List  ? Diagnosis Date Noted  ? Acute bronchitis 02/18/2022  ? Loss of transverse plantar arch of right foot 12/17/2021  ? COVID-19 long hauler manifesting chronic cough 10/28/2021  ? History of COVID-19 10/28/2021  ? Effusion of left knee 09/09/2021  ? History of deep vein thrombosis (DVT) of lower extremity 05/13/2021  ? Degenerative cervical disc 01/16/2021  ? Right shoulder pain 01/16/2021  ? Degenerative arthritis of knee 05/28/2020  ? Spondylosis without myelopathy or radiculopathy, lumbar region 10/11/2016  ? Migraines 06/08/2016  ? Nuclear sclerotic cataract of both eyes 06/08/2016  ? Presbyopia 06/08/2016  ? Hypermetropia of both eyes 06/08/2016  ? Long term current use of anticoagulant 06/02/2016  ? Recurrent right inguinal hernia 12/05/2014  ? Chronic venous insufficiency 11/28/2014  ? Bradycardia 10/11/2012  ? Right leg DVT ([place]) 10/03/2012  ? HLD (hyperlipidemia) 07/25/2012  ? ? ?PCP: Bernerd Limbo, MD ? ?REFERRING PROVIDER: Gaynelle Arabian, MD ? ?REFERRING DIAG: J19.147 (ICD-10-CM) - Presence of left  artificial knee joint ? ?THERAPY DIAG:  ?Left knee pain; left knee stiffness; weakness ? ?ONSET DATE: 12/13/2021 ? ?SUBJECTIVE:  ? ?SUBJECTIVE STATEMENT:  ?TKR 03/09/2022; Using RW.  Prior to surgery no walking device  ?With brother who assists with translation (pt speaks [place]) ? ?PERTINENT HISTORY: ?Left TKR 03/09/2022;   Right knee is "so-so"; sees Dr. Wynelle Link next week ? ?PAIN:  ?Are you having pain? PAIN:  ?Are you having pain? Yes ?NPRS scale: 8-9/10 ?Pain location: left knee  ?Pain orientation: Left, Medial, Lateral, and Anterior  ?  ?Aggravating factors: all activities  ?Relieving factors: rest  ? ?PRECAUTIONS: None ? ?WEIGHT BEARING RESTRICTIONS No ? ?FALLS:  ?Has patient fallen in last 6 months? No ? ?LIVING ENVIRONMENT: ?Lives with: lives with their family ?Lives in: House/apartment ?Stairs: Yes: Internal: 10 steps; can reach both and External: 6 steps; can reach both ?Has following equipment at home: Gilford Rile - 2 wheeled ? ?OCCUPATION: retired  ? ?PLOF: Independent with basic ADLs;  I like to cook  ? ?PATIENT GOALS hiking and fishing  ? ?OBJECTIVE:  ? ?DIAGNOSTIC FINDINGS: OA ? ?PATIENT SURVEYS:  ?FOTO 44% ? ?COGNITION: ? Overall cognitive status: Within functional limits for tasks assessed   ?  ? ?POSTURE:  ?Flexed knee in supine unable to fully extend ? ?PALPATION: ?Purple bruising medial left thigh; dressing over surgical incision; moderate edema apparent  ? ?LE  ROM: ? ?Active ROM Right ?4/4 Left ?4/4  ?Hip flexion    ?Hip extension    ?Hip abduction    ?Hip adduction    ?Hip internal rotation    ?Hip external rotation    ?Knee flexion 130 54  ?Knee extension 0 -25  ?Ankle dorsiflexion    ?Ankle plantarflexion    ?Ankle inversion    ?Ankle eversion    ? (Blank rows = not tested) ? ?LE MMT:  Unable to do SLR ? ?MMT Right ?4/4 Left ?4/4  ?Hip flexion 5 2  ?Hip extension    ?Hip abduction 5 3  ?Hip adduction    ?Hip internal rotation    ?Hip external rotation    ?Knee flexion 5 2  ?Knee extension 5 2   ?Ankle dorsiflexion    ?Ankle plantarflexion    ?Ankle inversion    ?Ankle eversion    ? (Blank rows = not tested) ? ? ?FUNCTIONAL TESTS:  ?Needs assist to lift leg on/off table  ? ?GAIT: ?Distance walked: 100 ?Assistive device utilized: Environmental consultant - 2 wheeled ?Level of assistance: Modified independence ?Comments:  ? ? ? ?TODAY'S TREATMENT: ?Instruction in frequent ROM;  ice /elevation for edema control ? ? ?PATIENT EDUCATION:  ?Education details: initial HEP ?Person educated: Patient and Caregiver ?Education method: Explanation, Demonstration, and Handouts ?Education comprehension: verbalized understanding and returned demonstration ? ? ?HOME EXERCISE PROGRAM: ?Access Code: [email or web address] ?URL: [email or web address] ?Date: 03/16/2022 ?Prepared by: Ruben Im ? ?Exercises ?- Seated Heel Slide  - 3 x daily - 7 x weekly - 1 sets - 20 reps ?- Hip Flexor Stretch on Step  - 3 x daily - 7 x weekly - 1 sets - 20 reps ?- Backward Weight Shift and Opposite Arm Raise with Walker  - 3 x daily - 7 x weekly - 1 sets - 20 reps ? ?ASSESSMENT: ? ?CLINICAL IMPRESSION: ?Patient is a 71 y.o. male who was seen today for physical therapy evaluation and treatment following left TKR on 03/09/22.  He demonstrates significant impairments in ROM, strength,  edema, gait and function as typical 1 week status post surgery.  He would benefit from skilled intervention to address these impairments.   ? ? ?OBJECTIVE IMPAIRMENTS decreased activity tolerance, decreased mobility, difficulty walking, decreased ROM, decreased strength, increased edema, impaired perceived functional ability, and pain.  ? ?ACTIVITY LIMITATIONS community activity and driving.  ? ?PERSONAL FACTORS Language barrier and other affected body regions  affect patient's functional outcome.  ? ? ?REHAB POTENTIAL: Good ? ?CLINICAL DECISION MAKING: Stable/uncomplicated ? ?EVALUATION COMPLEXITY: Low ? ? ?GOALS: ?Goals reviewed with patient? Yes ? ?SHORT TERM GOALS: Target date:  04/13/2022 ? ?The patient will demonstrate knowledge of basic self care strategies and exercises to promote healing   ?Baseline: ?Goal status: INITIAL ? ?2.  The patient will have improved knee ROM 10-90 degrees for improved ability to get in/out of the car ?Baseline:  ?Goal status: INITIAL ? ?3.  The patient will be able to walk household distances with a cane ?Baseline:  ?Goal status: INITIAL ? ?4.  The patient will have motor control/strength to maneuver LE in/out of bed without UE assist  ?Baseline:  ?Goal status: INITIAL ? ? ?LONG TERM GOALS: Target date: 05/11/2022 ? ?The patient will be independent in a safe self progression of a home exercise program to promote further recovery of function   ?Baseline:  ?Goal status: INITIAL ? ?2.  The patient will have improved knee ROM 6-120 degrees needed to ascend/descend steps  at home ?Baseline:  ?Goal status: INITIAL ? ?3.  The patient will have improved gait stamina and speed needed to ambulate >300 feet with cane ?Baseline:  ?Goal status: INITIAL ? ?4.  The patient will have improved LE strength to at least 4/5 needed for standing to cook and future return to fly fishing ?Baseline:  ?Goal status: INITIAL ? ?5.  FOTO score improved to 77% indicating improved function with less pain ?Baseline:  ?Goal status: INITIAL ? ? ?PLAN: ?PT FREQUENCY: 2x/week ? ?PT DURATION: 8 weeks up to 12 visits ? ?PLANNED INTERVENTIONS: Therapeutic exercises, Therapeutic activity, Neuromuscular re-education, Balance training, Gait training, Patient/Family education, Joint mobilization, Stair training, Aquatic Therapy, Dry Needling, Electrical stimulation, Cryotherapy, Taping, Vasopneumatic device, Ionotophoresis '4mg'$ /ml Dexamethasone, and Manual therapy ? ?PLAN FOR NEXT SESSION: knee ROM, strengthening; Nu-Step;  vasocompression  ? ? ?Alvera Singh, PT ?03/16/2022, 5:13 PM  ?

## 2022-03-16 ENCOUNTER — Ambulatory Visit: Payer: Medicare Other | Attending: Orthopedic Surgery | Admitting: Physical Therapy

## 2022-03-16 DIAGNOSIS — R252 Cramp and spasm: Secondary | ICD-10-CM | POA: Diagnosis present

## 2022-03-16 DIAGNOSIS — M6281 Muscle weakness (generalized): Secondary | ICD-10-CM | POA: Diagnosis present

## 2022-03-16 DIAGNOSIS — R600 Localized edema: Secondary | ICD-10-CM | POA: Insufficient documentation

## 2022-03-16 DIAGNOSIS — R2689 Other abnormalities of gait and mobility: Secondary | ICD-10-CM | POA: Diagnosis present

## 2022-03-16 DIAGNOSIS — M25662 Stiffness of left knee, not elsewhere classified: Secondary | ICD-10-CM | POA: Insufficient documentation

## 2022-03-16 DIAGNOSIS — M25562 Pain in left knee: Secondary | ICD-10-CM | POA: Diagnosis present

## 2022-03-16 DIAGNOSIS — R262 Difficulty in walking, not elsewhere classified: Secondary | ICD-10-CM | POA: Insufficient documentation

## 2022-03-17 ENCOUNTER — Ambulatory Visit: Payer: Medicare Other

## 2022-03-17 DIAGNOSIS — M25562 Pain in left knee: Secondary | ICD-10-CM

## 2022-03-17 DIAGNOSIS — M6281 Muscle weakness (generalized): Secondary | ICD-10-CM

## 2022-03-17 DIAGNOSIS — R252 Cramp and spasm: Secondary | ICD-10-CM

## 2022-03-17 DIAGNOSIS — M25662 Stiffness of left knee, not elsewhere classified: Secondary | ICD-10-CM

## 2022-03-17 DIAGNOSIS — R600 Localized edema: Secondary | ICD-10-CM

## 2022-03-17 DIAGNOSIS — R2689 Other abnormalities of gait and mobility: Secondary | ICD-10-CM

## 2022-03-17 NOTE — Therapy (Signed)
?OUTPATIENT PHYSICAL THERAPY TREATMENT NOTE ? ? ?Patient Name: Andrew [name] ?MRN: [id number] ?DOB:01-09-1951, 71 y.o., male ?Today's Date: 03/17/2022 ? ?PCP: Bernerd Limbo, MD ?REFERRING PROVIDER: Bernerd Limbo, MD ? ?END OF SESSION:  ? PT End of Session - 03/17/22 1253   ? ? Visit Number 2   ? Number of Visits 12   ? Date for PT Re-Evaluation 05/11/22   ? Authorization Type Medicare;  12 visits per Dr. Wynelle Link   ? PT Start Time 1235   ? PT Stop Time 1313   ? PT Time Calculation (min) 38 min   ? Activity Tolerance Patient limited by pain   ? Behavior During Therapy [facility] for tasks assessed/performed   ? ?  ?  ? ?  ? ? ?Past Medical History:  ?Diagnosis Date  ? DVT (deep venous thrombosis) ([place])   ? right leg in 2011; prior injury to right foot  ? Echocardiogram 07/2021  ? Echocardiogram 8/22: EF 60-65, no RWMA, mild LVH, low normal RVSF, trivial AI, mild AV sclerosis w/o AS  ? HLD (hyperlipidemia)   ? Right foot injury   ? ?Past Surgical History:  ?Procedure Laterality Date  ? NO PAST SURGERIES    ? ?Patient Active Problem List  ? Diagnosis Date Noted  ? Acute bronchitis 02/18/2022  ? Loss of transverse plantar arch of right foot 12/17/2021  ? COVID-19 long hauler manifesting chronic cough 10/28/2021  ? History of COVID-19 10/28/2021  ? Effusion of left knee 09/09/2021  ? History of deep vein thrombosis (DVT) of lower extremity 05/13/2021  ? Degenerative cervical disc 01/16/2021  ? Right shoulder pain 01/16/2021  ? Degenerative arthritis of knee 05/28/2020  ? Spondylosis without myelopathy or radiculopathy, lumbar region 10/11/2016  ? Migraines 06/08/2016  ? Nuclear sclerotic cataract of both eyes 06/08/2016  ? Presbyopia 06/08/2016  ? Hypermetropia of both eyes 06/08/2016  ? Long term current use of anticoagulant 06/02/2016  ? Recurrent right inguinal hernia 12/05/2014  ? Chronic venous insufficiency 11/28/2014  ? Bradycardia 10/11/2012  ? Right leg DVT ([place]) 10/03/2012  ? HLD (hyperlipidemia) 07/25/2012   ? ? ?REFERRING DIAG: [id number] (ICD-10-CM) - Presence of left artificial knee joint ? ?THERAPY DIAG:  ?Acute pain of left knee ? ?Stiffness of left knee, not elsewhere classified ? ?Muscle weakness (generalized) ? ?Localized edema ? ?Balance problem ? ?Cramp and spasm ? ?PERTINENT HISTORY: See above ? ?PRECAUTIONS: none ? ?SUBJECTIVE: Patient reports doing ok but pain is 8/10.  Could not tolerate oxycodone and was switched to Dilaudid.  Taking only one dilaudid every 6 hours and tramadol if needed.   ? ?PAIN:  ?Are you having pain? Yes: Pain location: left knee 8/10 ?Pain description: aching ?Aggravating factors: static positions ?Relieving factors: medication ? ?OBJECTIVE:  ?  ?DIAGNOSTIC FINDINGS: OA ?  ?PATIENT SURVEYS:  ?FOTO 44% ?  ?COGNITION: ?          Overall cognitive status: Within functional limits for tasks assessed               ?           ?  ?POSTURE:  ?Flexed knee in supine unable to fully extend ?  ?PALPATION: ?Purple bruising medial left thigh; dressing over surgical incision; moderate edema apparent  ?  ?LE ROM: ?  ?Active ROM Right ?4/4 Left ?4/4  ?Hip flexion      ?Hip extension      ?Hip abduction      ?Hip adduction      ?  Hip internal rotation      ?Hip external rotation      ?Knee flexion 130 54  ?Knee extension 0 -25  ?Ankle dorsiflexion      ?Ankle plantarflexion      ?Ankle inversion      ?Ankle eversion      ? (Blank rows = not tested) ?  ?LE MMT:  Unable to do SLR ?  ?MMT Right ?4/4 Left ?4/4  ?Hip flexion 5 2  ?Hip extension      ?Hip abduction 5 3  ?Hip adduction      ?Hip internal rotation      ?Hip external rotation      ?Knee flexion 5 2  ?Knee extension 5 2  ?Ankle dorsiflexion      ?Ankle plantarflexion      ?Ankle inversion      ?Ankle eversion      ? (Blank rows = not tested) ?  ?  ?FUNCTIONAL TESTS:  ?Needs assist to lift leg on/off table  ?  ?GAIT: ?Distance walked: 100 ?Assistive device utilized: Environmental consultant - 2 wheeled ?Level of assistance: Modified independence ?Comments:  ?  ?   ?  ?TODAY'S TREATMENT: 03-17-22 ?NuStep x 5 min (level 1) ?Seated heel slide x 20 with slider ?Seated LAQ x 20 0# ?Seated quad set with slider x 20 ?Instructed in Sit to supine hooking right heel behind left to left involved onto table ?Supine heel slide x 20 ?Supine quad set with slider x 20 ?Supine SLR initially with assist then independent 2 x 10 (approx 30-40 degree heel lag) ?Instructed in proper supine to sit unassisted ?See education for pain control and review of HEP ? ?TODAY'S TREATMENT: ?Instruction in frequent ROM;  ice /elevation for edema control ?  ?  ?PATIENT EDUCATION:  ?Education details: emphasized need for pain control and how to adjust medication dosages and use of ice. Emphasized need to prop left LE for extension.  Encouraged patient to avoid over assisting the left LE on/off bed or in/out of car to allow for gaining strength.   ?Person educated: Patient and Caregiver ?Education method: Explanation, Demonstration, and Handouts ?Education comprehension: verbalized understanding and returned demonstration ?  ?  ?HOME EXERCISE PROGRAM: ?Access Code: [email or web address] ?URL: [email or web address] ?Date: 03/16/2022 ?Prepared by: Ruben Im ?  ?Exercises ?- Seated Heel Slide  - 3 x daily - 7 x weekly - 1 sets - 20 reps ?- Hip Flexor Stretch on Step  - 3 x daily - 7 x weekly - 1 sets - 20 reps ?- Backward Weight Shift and Opposite Arm Raise with Walker  - 3 x daily - 7 x weekly - 1 sets - 20 reps ?  ?ASSESSMENT: ?  ?CLINICAL IMPRESSION: ?Patient is struggling with pain control which will affect his outcomes.  We discussed proper pain control and that patient may need to try pain med for a few days to be desensitized to its side effects.  Patient was able to do several SLR's independently after initiating with assist.  He ambulates with knee flexion during stance.  He is lacking approximately 40 degrees of extension today.  We suggested emphasizing pain control and extension propping prior to  next visit.    ?  ?  ?OBJECTIVE IMPAIRMENTS decreased activity tolerance, decreased mobility, difficulty walking, decreased ROM, decreased strength, increased edema, impaired perceived functional ability, and pain.  ?  ?ACTIVITY LIMITATIONS community activity and driving.  ?  ?PERSONAL FACTORS Language barrier and other affected body regions  affect patient's functional outcome.  ?  ?  ?REHAB POTENTIAL: Good ?  ?CLINICAL DECISION MAKING: Stable/uncomplicated ?  ?EVALUATION COMPLEXITY: Low ?  ?  ?GOALS: ?Goals reviewed with patient? Yes ?  ?SHORT TERM GOALS: Target date: 04/13/2022 ?  ?The patient will demonstrate knowledge of basic self care strategies and exercises to promote healing   ?Baseline: ?Goal status: INITIAL ?  ?2.  The patient will have improved knee ROM 10-90 degrees for improved ability to get in/out of the car ?Baseline:  ?Goal status: INITIAL ?  ?3.  The patient will be able to walk household distances with a cane ?Baseline:  ?Goal status: INITIAL ?  ?4.  The patient will have motor control/strength to maneuver LE in/out of bed without UE assist  ?Baseline:  ?Goal status: INITIAL ?  ?  ?LONG TERM GOALS: Target date: 05/11/2022 ?  ?The patient will be independent in a safe self progression of a home exercise program to promote further recovery of function   ?Baseline:  ?Goal status: INITIAL ?  ?2.  The patient will have improved knee ROM 6-120 degrees needed to ascend/descend steps at home ?Baseline:  ?Goal status: INITIAL ?  ?3.  The patient will have improved gait stamina and speed needed to ambulate >300 feet with cane ?Baseline:  ?Goal status: INITIAL ?  ?4.  The patient will have improved LE strength to at least 4/5 needed for standing to cook and future return to fly fishing ?Baseline:  ?Goal status: INITIAL ?  ?5.  FOTO score improved to 77% indicating improved function with less pain ?Baseline:  ?Goal status: INITIAL ?  ?  ?PLAN: ?PT FREQUENCY: 2x/week ?  ?PT DURATION: 8 weeks up to 12 visits ?   ?PLANNED INTERVENTIONS: Therapeutic exercises, Therapeutic activity, Neuromuscular re-education, Balance training, Gait training, Patient/Family education, Joint mobilization, Stair training, Aquatic Therapy, Dry Need

## 2022-03-18 ENCOUNTER — Telehealth: Payer: Self-pay

## 2022-03-18 NOTE — Telephone Encounter (Signed)
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   ? ?Procedure: Ectropion repair/probe nasolacrimal duct w/tube or stent right eye ?Date of procedure: 03/31/2022 ? ?CrCl 90 mL/min (07/2021; SCr 0.95) ?Platelet count 217 (07/2021) ? ?Patient's indication for anticoagulation is noncardiac - h/o recurrent DVT. Xarelto prescribed by PCP, will defer to his PCP. ?

## 2022-03-18 NOTE — Telephone Encounter (Signed)
? ?  Pre-operative Risk Assessment  ?  ?Patient Name: Andrew [name]  ?DOB: August 11, 1951 ?MRN: [id number]  ? ?  ? ?Request for Surgical Clearance   ? ?Procedure:   Ectropion repair/probe nasolacrimal duct w/tube or stent right eye. ? ?Date of Surgery:  Clearance 03/31/22                              ?   ?Surgeon:  Dr. Delia Chimes ?Surgeon's Group or Practice Name:  [facility] ?Phone number:  [phone] ext [phone] ?Fax number:  [phone] ?  ?Type of Clearance Requested:   ?- Medical  ?- Pharmacy:  Hold Rivaroxaban (Xarelto) x 2-3 days before surgery ?  ?Type of Anesthesia:   IV sedation ?  ?Additional requests/questions:     ? ?Signed, ?Nakhia Levitan   ?03/18/2022, 3:27 PM  ? ?

## 2022-03-19 ENCOUNTER — Encounter: Payer: Self-pay | Admitting: *Deleted

## 2022-03-19 ENCOUNTER — Encounter: Payer: Medicare Other | Admitting: Physical Therapy

## 2022-03-19 ENCOUNTER — Telehealth: Payer: Self-pay | Admitting: *Deleted

## 2022-03-19 NOTE — Telephone Encounter (Signed)
DPR ok to s/w pt's brother [name]. [name], has made tele pre op appt for the pt for 03/26/22 @ 2 pm. Med rec and consent have been done.  ? ?  ?Patient Consent for Virtual Visit  ? ? ?   ? ?Kreig Parson has provided verbal consent on 03/19/2022 for a virtual visit (video or telephone). ? ? ?CONSENT FOR VIRTUAL VISIT FOR:  Andrew [name]  ?By participating in this virtual visit I agree to the following: ? ?I hereby voluntarily request, consent and authorize [place] and its employed or contracted physicians, physician assistants, nurse practitioners or other licensed health care professionals (the Practitioner), to provide me with telemedicine health care services (the ?Services") as deemed necessary by the treating Practitioner. I acknowledge and consent to receive the Services by the Practitioner via telemedicine. I understand that the telemedicine visit will involve communicating with the Practitioner through live audiovisual communication technology and the disclosure of certain medical information by electronic transmission. I acknowledge that I have been given the opportunity to request an in-person assessment or other available alternative prior to the telemedicine visit and am voluntarily participating in the telemedicine visit. ? ?I understand that I have the right to withhold or withdraw my consent to the use of telemedicine in the course of my care at any time, without affecting my right to future care or treatment, and that the Practitioner or I may terminate the telemedicine visit at any time. I understand that I have the right to inspect all information obtained and/or recorded in the course of the telemedicine visit and may receive copies of available information for a reasonable fee.  I understand that some of the potential risks of receiving the Services via telemedicine include:  ?Delay or interruption in medical evaluation due to technological equipment failure or disruption; ?Information  transmitted may not be sufficient (e.g. poor resolution of images) to allow for appropriate medical decision making by the Practitioner; and/or  ?In rare instances, security protocols could fail, causing a breach of personal health information. ? ?Furthermore, I acknowledge that it is my responsibility to provide information about my medical history, conditions and care that is complete and accurate to the best of my ability. I acknowledge that Practitioner's advice, recommendations, and/or decision may be based on factors not within their control, such as incomplete or inaccurate data provided by me or distortions of diagnostic images or specimens that may result from electronic transmissions. I understand that the practice of medicine is not an exact science and that Practitioner makes no warranties or guarantees regarding treatment outcomes. I acknowledge that a copy of this consent can be made available to me via my patient portal ([place]), or I can request a printed copy by calling the office of [facility].   ? ?I understand that my insurance will be billed for this visit.  ? ?I have read or had this consent read to me. ?I understand the contents of this consent, which adequately explains the benefits and risks of the Services being provided via telemedicine.  ?I have been provided ample opportunity to ask questions regarding this consent and the Services and have had my questions answered to my satisfaction. ?I give my informed consent for the services to be provided through the use of telemedicine in my medical care ? ? ? ?

## 2022-03-19 NOTE — Telephone Encounter (Signed)
DPR ok to s/w pt's brother Andrew [name]. Andrew [name], has made tele pre op appt for the pt for 03/26/22 @ 2 pm. Med rec and consent have been done ?

## 2022-03-19 NOTE — Telephone Encounter (Signed)
? ?  Patient Name: Andrew [name]  ?DOB: 02/15/1951 ?MRN: [id number] ? ?Primary Cardiologist: Sherren Mocha, MD ? ?Chart reviewed as part of pre-operative protocol coverage.  ? ?Preoperative team, please contact this patient and set up a phone call appointment for further cardiac evaluation.   ? ?Thank you for your help. ? 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   ?  ?Procedure: Ectropion repair/probe nasolacrimal duct w/tube or stent right eye ?Date of procedure: 03/31/2022 ?  ?CrCl 90 mL/min (07/2021; SCr 0.95) ?Platelet count 217 (07/2021) ?  ?Patient's indication for anticoagulation is noncardiac - h/o recurrent DVT. Xarelto prescribed by PCP, will defer to his PCP for recommendations on holding Xarelto prior to procedure. ? ?Lenna Sciara, NP ?03/19/2022, 1:26 PM ? ? ?

## 2022-03-19 NOTE — Telephone Encounter (Signed)
This encounter was created in error - please disregard.

## 2022-03-22 ENCOUNTER — Ambulatory Visit: Payer: Medicare Other | Admitting: Physical Therapy

## 2022-03-22 ENCOUNTER — Encounter: Payer: Self-pay | Admitting: Physical Therapy

## 2022-03-22 DIAGNOSIS — M25562 Pain in left knee: Secondary | ICD-10-CM | POA: Diagnosis not present

## 2022-03-22 DIAGNOSIS — M6281 Muscle weakness (generalized): Secondary | ICD-10-CM

## 2022-03-22 DIAGNOSIS — M25662 Stiffness of left knee, not elsewhere classified: Secondary | ICD-10-CM

## 2022-03-22 NOTE — Therapy (Signed)
?OUTPATIENT PHYSICAL THERAPY TREATMENT NOTE ? ? ?Patient Name: Andrew [name] ?MRN: [id number] ?DOB:1951/03/21, 71 y.o., male ?Today's Date: 03/22/2022 ? ?PCP: Bernerd Limbo, MD ?REFERRING PROVIDER: Gaynelle Arabian, MD ? ?END OF SESSION:  ? PT End of Session - 03/22/22 1353   ? ? Visit Number 3   ? Number of Visits 12   ? Date for PT Re-Evaluation 05/11/22   ? Authorization Type Medicare;  12 visits per Dr. Wynelle Link   ? PT Start Time 9371   ? PT Stop Time 6967   ? PT Time Calculation (min) 49 min   ? Activity Tolerance Patient tolerated treatment well   ? Behavior During Therapy [facility] for tasks assessed/performed   ? ?  ?  ? ?  ? ? ?Past Medical History:  ?Diagnosis Date  ? DVT (deep venous thrombosis) ([place])   ? right leg in 2011; prior injury to right foot  ? Echocardiogram 07/2021  ? Echocardiogram 8/22: EF 60-65, no RWMA, mild LVH, low normal RVSF, trivial AI, mild AV sclerosis w/o AS  ? HLD (hyperlipidemia)   ? Right foot injury   ? ?Past Surgical History:  ?Procedure Laterality Date  ? NO PAST SURGERIES    ? ?Patient Active Problem List  ? Diagnosis Date Noted  ? Acute bronchitis 02/18/2022  ? Loss of transverse plantar arch of right foot 12/17/2021  ? COVID-19 long hauler manifesting chronic cough 10/28/2021  ? History of COVID-19 10/28/2021  ? Effusion of left knee 09/09/2021  ? History of deep vein thrombosis (DVT) of lower extremity 05/13/2021  ? Degenerative cervical disc 01/16/2021  ? Right shoulder pain 01/16/2021  ? Degenerative arthritis of knee 05/28/2020  ? Spondylosis without myelopathy or radiculopathy, lumbar region 10/11/2016  ? Migraines 06/08/2016  ? Nuclear sclerotic cataract of both eyes 06/08/2016  ? Presbyopia 06/08/2016  ? Hypermetropia of both eyes 06/08/2016  ? Long term current use of anticoagulant 06/02/2016  ? Recurrent right inguinal hernia 12/05/2014  ? Chronic venous insufficiency 11/28/2014  ? Bradycardia 10/11/2012  ? Right leg DVT ([place]) 10/03/2012  ? HLD (hyperlipidemia) 07/25/2012   ? ? ?REFERRING DIAG: [id number] (ICD-10-CM) - Presence of left artificial knee joint ? ?THERAPY DIAG:  ?Acute pain of left knee ? ?Stiffness of left knee, not elsewhere classified ? ?Muscle weakness (generalized) ? ?PERTINENT HISTORY: See above ? ?PRECAUTIONS: none ? ?SUBJECTIVE: Pt family concerned pt has an infection because it is so swollen. Pain currently rated as 6-7/10. They have appt on Wednesday to see MD. ?PAIN:  ?Are you having pain? Yes: Pain location: left knee 6/10 ?Pain description: aching ?Aggravating factors: static positions ?Relieving factors: medication ? ?OBJECTIVE:  ?  ?DIAGNOSTIC FINDINGS: OA ?  ?PATIENT SURVEYS:  ?FOTO 44% ?  ?COGNITION: ?          Overall cognitive status: Within functional limits for tasks assessed               ?           ?  ?POSTURE:  ?Flexed knee in supine unable to fully extend ?  ?PALPATION: ?Purple bruising medial left thigh; dressing over surgical incision; moderate edema apparent  ?  ?LE ROM: ?  ?Active ROM Right ?4/4 Left ?4/4 Left  ?03/22/22  ?Hip flexion       ?Hip extension       ?Hip abduction       ?Hip adduction       ?Hip internal rotation       ?Hip  external rotation       ?Knee flexion 130 54 70  ?Knee extension 0 -25   ?Ankle dorsiflexion       ?Ankle plantarflexion       ?Ankle inversion       ?Ankle eversion       ? (Blank rows = not tested) ?  ?LE MMT:  Unable to do SLR ?  ?MMT Right ?4/4 Left ?4/4  ?Hip flexion 5 2  ?Hip extension      ?Hip abduction 5 3  ?Hip adduction      ?Hip internal rotation      ?Hip external rotation      ?Knee flexion 5 2  ?Knee extension 5 2  ?Ankle dorsiflexion      ?Ankle plantarflexion      ?Ankle inversion      ?Ankle eversion      ? (Blank rows = not tested) ?  ?  ?FUNCTIONAL TESTS:  ?Needs assist to lift leg on/off table  ?  ?GAIT: ?Distance walked: 100 ?Assistive device utilized: Environmental consultant - 2 wheeled ?Level of assistance: Modified independence ?Comments:  ?  ?  ?  ?TODAY'S TREATMENT:  ?03/22/22: ?NuStep x 6 min (level 1)  PTA present to discuss current status. ?Seated heel slides 2x20 ?Standing heel raises 10x, hip abduction 10x alt,  ?Knee flexion on second step 20x2 ?Game ready 15 min 3 flakes low compression. Pt did not do well with LE elevated, caused too much pain. Better with LE on mat table with small towel supporting knee.  ?MANUAL: gentle hamstring soft tissue work and retrograde massage for edema ? ?03-17-22 ?NuStep x 5 min (level 1) ?Seated heel slide x 20 with slider ?Seated LAQ x 20 0# ?Seated quad set with slider x 20 ?Instructed in Sit to supine hooking right heel behind left to left involved onto table ?Supine heel slide x 20 ?Supine quad set with slider x 20 ?Supine SLR initially with assist then independent 2 x 10 (approx 30-40 degree heel lag) ?Instructed in proper supine to sit unassisted ?See education for pain control and review of HEP ? ?TODAY'S TREATMENT: ?Instruction in frequent ROM;  ice /elevation for edema control ?  ?  ?PATIENT EDUCATION:  ?Education details: emphasized need for pain control and how to adjust medication dosages and use of ice. Emphasized need to prop left LE for extension.  Encouraged patient to avoid over assisting the left LE on/off bed or in/out of car to allow for gaining strength.   ?Person educated: Patient and Caregiver ?Education method: Explanation, Demonstration, and Handouts ?Education comprehension: verbalized understanding and returned demonstration ?  ?  ?HOME EXERCISE PROGRAM: ?Access Code: [email or web address] ?URL: [email or web address] ?Date: 03/16/2022 ?Prepared by: Ruben Im ?  ?Exercises ?- Seated Heel Slide  - 3 x daily - 7 x weekly - 1 sets - 20 reps ?- Hip Flexor Stretch on Step  - 3 x daily - 7 x weekly - 1 sets - 20 reps ?- Backward Weight Shift and Opposite Arm Raise with Walker  - 3 x daily - 7 x weekly - 1 sets - 20 reps ?  ?ASSESSMENT: ?  ?CLINICAL IMPRESSION: ?Pt arrives with family for PT. Pt reports pain currently down to 6-7/10 but still can get as  high as 9-10. Pt sees his MD this Wednesday to rule out infection. Knee was slightly red medial to bandage but seemed to improve in color after some AROM exercises. No painful behaviors with any exercise, just some  gastroc cramping. Active flexion greatly improved since eval. ? ?OBJECTIVE IMPAIRMENTS decreased activity tolerance, decreased mobility, difficulty walking, decreased ROM, decreased strength, increased edema, impaired perceived functional ability, and pain.  ?  ?ACTIVITY LIMITATIONS community activity and driving.  ?  ?PERSONAL FACTORS Language barrier and other affected body regions  affect patient's functional outcome.  ?  ?  ?REHAB POTENTIAL: Good ?  ?CLINICAL DECISION MAKING: Stable/uncomplicated ?  ?EVALUATION COMPLEXITY: Low ?  ?  ?GOALS: ?Goals reviewed with patient? Yes ?  ?SHORT TERM GOALS: Target date: 04/13/2022 ?  ?The patient will demonstrate knowledge of basic self care strategies and exercises to promote healing   ?Baseline: ?Goal status: INITIAL ?  ?2.  The patient will have improved knee ROM 10-90 degrees for improved ability to get in/out of the car ?Baseline:  ?Goal status: INITIAL ?  ?3.  The patient will be able to walk household distances with a cane ?Baseline:  ?Goal status: INITIAL ?  ?4.  The patient will have motor control/strength to maneuver LE in/out of bed without UE assist  ?Baseline:  ?Goal status: INITIAL ?  ?  ?LONG TERM GOALS: Target date: 05/11/2022 ?  ?The patient will be independent in a safe self progression of a home exercise program to promote further recovery of function   ?Baseline:  ?Goal status: INITIAL ?  ?2.  The patient will have improved knee ROM 6-120 degrees needed to ascend/descend steps at home ?Baseline:  ?Goal status: INITIAL ?  ?3.  The patient will have improved gait stamina and speed needed to ambulate >300 feet with cane ?Baseline:  ?Goal status: INITIAL ?  ?4.  The patient will have improved LE strength to at least 4/5 needed for standing to cook  and future return to fly fishing ?Baseline:  ?Goal status: INITIAL ?  ?5.  FOTO score improved to 77% indicating improved function with less pain ?Baseline:  ?Goal status: INITIAL ?  ?  ?PLAN: ?PT FREQUENCY:

## 2022-03-25 NOTE — Therapy (Signed)
?OUTPATIENT PHYSICAL THERAPY TREATMENT NOTE ? ? ?Patient Name: Andrew [name] ?MRN: [id number] ?DOB:May 18, 1951, 71 y.o., male ?Today's Date: 03/26/2022 ? ?PCP: Bernerd Limbo, MD ?REFERRING PROVIDER: Gaynelle Arabian, MD ? ?END OF SESSION:  ? PT End of Session - 03/26/22 1030   ? ? Visit Number 4   ? Number of Visits 12   ? Date for PT Re-Evaluation 05/11/22   ? Authorization Type Medicare;  12 visits per Dr. Wynelle Link   ? PT Start Time 1017   ? PT Stop Time 1107   ? PT Time Calculation (min) 50 min   ? Activity Tolerance Patient tolerated treatment well   ? Behavior During Therapy [facility] for tasks assessed/performed   ? ?  ?  ? ?  ? ? ?Past Medical History:  ?Diagnosis Date  ? DVT (deep venous thrombosis) ([place])   ? right leg in 2011; prior injury to right foot  ? Echocardiogram 07/2021  ? Echocardiogram 8/22: EF 60-65, no RWMA, mild LVH, low normal RVSF, trivial AI, mild AV sclerosis w/o AS  ? HLD (hyperlipidemia)   ? Right foot injury   ? ?Past Surgical History:  ?Procedure Laterality Date  ? NO PAST SURGERIES    ? ?Patient Active Problem List  ? Diagnosis Date Noted  ? Acute bronchitis 02/18/2022  ? Loss of transverse plantar arch of right foot 12/17/2021  ? COVID-19 long hauler manifesting chronic cough 10/28/2021  ? History of COVID-19 10/28/2021  ? Effusion of left knee 09/09/2021  ? History of deep vein thrombosis (DVT) of lower extremity 05/13/2021  ? Degenerative cervical disc 01/16/2021  ? Right shoulder pain 01/16/2021  ? Degenerative arthritis of knee 05/28/2020  ? Spondylosis without myelopathy or radiculopathy, lumbar region 10/11/2016  ? Migraines 06/08/2016  ? Nuclear sclerotic cataract of both eyes 06/08/2016  ? Presbyopia 06/08/2016  ? Hypermetropia of both eyes 06/08/2016  ? Long term current use of anticoagulant 06/02/2016  ? Recurrent right inguinal hernia 12/05/2014  ? Chronic venous insufficiency 11/28/2014  ? Bradycardia 10/11/2012  ? Right leg DVT ([place]) 10/03/2012  ? HLD (hyperlipidemia) 07/25/2012   ? ?REFERRING DIAG: N82.956 (ICD-10-CM) - Presence of left artificial knee joint ?  ?THERAPY DIAG:  ?Acute pain of left knee ?  ?Stiffness of left knee, not elsewhere classified ?  ?Muscle weakness (generalized) ?  ?PERTINENT HISTORY: See above ?  ?PRECAUTIONS: none ?  ?SUBJECTIVE: Pt family concerned pt has an infection because it is so swollen. Pain currently rated as 6-7/10. They have appt on Wednesday to see MD. ?PAIN:  ?Are you having pain? Yes: Pain location: left knee 6/10 ?Pain description: aching ?Aggravating factors: static positions ?Relieving factors: medication ?  ?OBJECTIVE:  ?  ?DIAGNOSTIC FINDINGS: OA ?  ?PATIENT SURVEYS:  ?FOTO 44% ?  ?COGNITION: ?          Overall cognitive status: Within functional limits for tasks assessed               ?           ?  ?POSTURE:  ?Flexed knee in supine unable to fully extend ?  ?PALPATION: ?Purple bruising medial left thigh; dressing over surgical incision; moderate edema apparent  ?  ?LE ROM: ?  ?Active ROM Right ?4/4 Left ?4/4 Left  ?03/22/22  ?Hip flexion        ?Hip extension        ?Hip abduction        ?Hip adduction        ?  Hip internal rotation        ?Hip external rotation        ?Knee flexion 130 54 70  ?Knee extension 0 -25    ?Ankle dorsiflexion        ?Ankle plantarflexion        ?Ankle inversion        ?Ankle eversion        ? (Blank rows = not tested) ?  ?LE MMT:  Unable to do SLR ?  ?MMT Right ?4/4 Left ?4/4  ?Hip flexion 5 2  ?Hip extension      ?Hip abduction 5 3  ?Hip adduction      ?Hip internal rotation      ?Hip external rotation      ?Knee flexion 5 2  ?Knee extension 5 2  ?Ankle dorsiflexion      ?Ankle plantarflexion      ?Ankle inversion      ?Ankle eversion      ? (Blank rows = not tested) ?  ?  ?FUNCTIONAL TESTS:  ?Needs assist to lift leg on/off table  ?  ?GAIT: ?Distance walked: 100 ?Assistive device utilized: Environmental consultant - 2 wheeled ?Level of assistance: Modified independence ?Comments:  ?  ?  ?  ?TODAY'S TREATMENT:  ?03/26/22: ?Lt Quad set  with PT overpressure 10x5 sec hold  ?Lt Heel slide with sheet assist x5 reps ?Seated edge of mat table with Lt knee end range flexion hold x60sec, increasing knee flexion x3 more times for 60 sec hold ?Seated Lt knee flexion with Rt LE assist 10x5 sec hold  ?Game ready 3 snowflakes, medium pressure x8 min; pt in reclined position unable to tolerate supine with leg elevated at this time ?MANUAL: Lt proximal fibular head mobilization grade II-III, STM Lt peroneals, lateral gastroc/soleus ?03/22/22: ?NuStep x 6 min (level 1) PTA present to discuss current status. ?Seated heel slides 2x20 ?Standing heel raises 10x, hip abduction 10x alt,  ?Knee flexion on second step 20x2 ?Game ready 15 min 3 flakes low compression. Pt did not do well with LE elevated, caused too much pain. Better with LE on mat table with small towel supporting knee.  ?MANUAL: gentle hamstring soft tissue work and retrograde massage for edema ?  ?03-17-22 ?NuStep x 5 min (level 1) ?Seated heel slide x 20 with slider ?Seated LAQ x 20 0# ?Seated quad set with slider x 20 ?Instructed in Sit to supine hooking right heel behind left to left involved onto table ?Supine heel slide x 20 ?Supine quad set with slider x 20 ?Supine SLR initially with assist then independent 2 x 10 (approx 30-40 degree heel lag) ?Instructed in proper supine to sit unassisted ?See education for pain control and review of HEP ?  ?PATIENT EDUCATION:  ?Education details: importance of using AD for pain control and normalization of gait, using gentle overpressure at home with extension exercises; discussed benefit of thigh high compression for swelling;  ?Person educated: Patient and Caregiver ?Education method: Explanation, Demonstration, and Handouts ?Education comprehension: verbalized understanding and returned demonstration ?  ?  ?HOME EXERCISE PROGRAM: ?Access Code: [email or web address] ?URL: [email or web address] ?Date: 03/16/2022 ?Prepared by: Ruben Im ?  ?Exercises ?-  Seated Heel Slide  - 3 x daily - 7 x weekly - 1 sets - 20 reps ?- Hip Flexor Stretch on Step  - 3 x daily - 7 x weekly - 1 sets - 20 reps ?- Backward Weight Shift and Opposite Arm Raise with Walker  - 3 x  daily - 7 x weekly - 1 sets - 20 reps ?  ?ASSESSMENT: ?  ?CLINICAL IMPRESSION: ?Pt arrived without his AD, demonstrating antalgic gait. His brother was present to assist with translating. Pt continues to have higher levels of knee pain despite his pain meds. He was able to assist with exercises improving knee extension ROM while PT provided over pressure. End of session, he demonstrated improvements in knee extension to lacking 15 deg. He was unable to elevate the Lt LE during ice/compression secondary to cramps in the leg. Pt would continue to benefit from education regarding using his AD as well as pain management and ROM.  ?  ?OBJECTIVE IMPAIRMENTS decreased activity tolerance, decreased mobility, difficulty walking, decreased ROM, decreased strength, increased edema, impaired perceived functional ability, and pain.  ?  ?ACTIVITY LIMITATIONS community activity and driving.  ?  ?PERSONAL FACTORS Language barrier and other affected body regions  affect patient's functional outcome.  ?  ?  ?REHAB POTENTIAL: Good ?  ?CLINICAL DECISION MAKING: Stable/uncomplicated ?  ?EVALUATION COMPLEXITY: Low ?  ?  ?GOALS: ?Goals reviewed with patient? Yes ?  ?SHORT TERM GOALS: Target date: 04/13/2022 ?  ?The patient will demonstrate knowledge of basic self care strategies and exercises to promote healing   ?Baseline: ?Goal status: INITIAL ?  ?2.  The patient will have improved knee ROM 10-90 degrees for improved ability to get in/out of the car ?Baseline:  ?Goal status: INITIAL ?  ?3.  The patient will be able to walk household distances with a cane ?Baseline:  ?Goal status: INITIAL ?  ?4.  The patient will have motor control/strength to maneuver LE in/out of bed without UE assist  ?Baseline:  ?Goal status: INITIAL ?  ?  ?LONG TERM  GOALS: Target date: 05/11/2022 ?  ?The patient will be independent in a safe self progression of a home exercise program to promote further recovery of function   ?Baseline:  ?Goal status: INITIAL ?  ?2.  Th

## 2022-03-26 ENCOUNTER — Ambulatory Visit: Payer: Medicare Other | Admitting: Physical Therapy

## 2022-03-26 ENCOUNTER — Encounter: Payer: Self-pay | Admitting: Physician Assistant

## 2022-03-26 ENCOUNTER — Ambulatory Visit (INDEPENDENT_AMBULATORY_CARE_PROVIDER_SITE_OTHER): Payer: Medicare Other | Admitting: Physician Assistant

## 2022-03-26 ENCOUNTER — Encounter: Payer: Self-pay | Admitting: Physical Therapy

## 2022-03-26 DIAGNOSIS — M25662 Stiffness of left knee, not elsewhere classified: Secondary | ICD-10-CM

## 2022-03-26 DIAGNOSIS — M25562 Pain in left knee: Secondary | ICD-10-CM

## 2022-03-26 DIAGNOSIS — R600 Localized edema: Secondary | ICD-10-CM

## 2022-03-26 DIAGNOSIS — Z01810 Encounter for preprocedural cardiovascular examination: Secondary | ICD-10-CM | POA: Diagnosis not present

## 2022-03-26 DIAGNOSIS — R2689 Other abnormalities of gait and mobility: Secondary | ICD-10-CM

## 2022-03-26 DIAGNOSIS — R252 Cramp and spasm: Secondary | ICD-10-CM

## 2022-03-26 DIAGNOSIS — M6281 Muscle weakness (generalized): Secondary | ICD-10-CM

## 2022-03-26 NOTE — Telephone Encounter (Signed)
Xarelto hasn't been prescribed by cardiology since 2020. However, Dr. Burt Knack did clear patient to hold Xarelto for 3 days without a bridge in Jan 2023. ? ?Therefore patient may hold Xarelto for 2 days prior to procedure. ?

## 2022-03-26 NOTE — Progress Notes (Signed)
? ?Virtual Visit via Telephone Note  ? ?This visit type was conducted due to national recommendations for restrictions regarding the COVID-19 Pandemic (e.g. social distancing) in an effort to limit this patient's exposure and mitigate transmission in our community.  Due to his co-morbid illnesses, this patient is at least at moderate risk for complications without adequate follow up.  This format is felt to be most appropriate for this patient at this time.  The patient did not have access to video technology/had technical difficulties with video requiring transitioning to audio format only (telephone).  All issues noted in this document were discussed and addressed.  No physical exam could be performed with this format.  Please refer to the patient's chart for his  consent to telehealth for [facility]. ? ?Evaluation Performed:  Preoperative cardiovascular risk assessment ?_____________  ? ?Date:  03/26/2022  ? ?Patient ID:  Andrew [name], Andrew [name] [birth year], MRN [id number] ?Patient Location:  ?Home ?Provider location:   ?Office ? ?Primary Care Provider:  Bernerd Limbo, MD ?Primary Cardiologist:  Sherren Mocha, MD ? ?Chief Complaint  ?  ?71 y.o. y/o male with a h/o chronic DVT, HLD, and leg edema, who is pending Ectropion repair/probe nasolacrimal duct w/tube or stent right eye, and presents today for telephonic preoperative cardiovascular risk assessment. ? ?Past Medical History  ?  ?Past Medical History:  ?Diagnosis Date  ? DVT (deep venous thrombosis) ([place])   ? right leg in 2011; prior injury to right foot  ? Echocardiogram 07/2021  ? Echocardiogram 8/22: EF 60-65, no RWMA, mild LVH, low normal RVSF, trivial AI, mild AV sclerosis w/o AS  ? HLD (hyperlipidemia)   ? Right foot injury   ? ?Past Surgical History:  ?Procedure Laterality Date  ? NO PAST SURGERIES    ? ? ?Allergies ? ?Allergies  ?Allergen Reactions  ? Penicillins Anaphylaxis  ? Influenza Vaccines   ?  Broke out in hives and had breathing problems   ? ? ?History of Present Illness  ?  ?Andrew [name] is a 71 y.o. male who presents via audio/video conferencing for a telehealth visit today.  Pt was last seen in cardiology clinic on 07/15/21 by Richardson Dopp PA-C.  At that time Andrew [name] was doing well.  The patient is now pending Ectropion repair/probe nasolacrimal duct w/tube or stent right eye.  Since his last visit, he has done well.  ? ?He had knee replacement 2 weeks ago following complications from a knee surgery to repair a torn meniscus. He is still doing PT. He does not have a history of ischemic heart disease or stroke. He is walking short distances. Prior to surgery, he was walking daily and could walk 3 miles (on a torn meniscus). He denies chest pain and dyspnea.  ? ?He and his brother confirmed that Dr. Burt [name] prescribes his xarelto and has managed this for him.  ? ? ?Home Medications  ?  ?Prior to Admission medications   ?Medication Sig Start Date End Date Taking? Authorizing Provider  ?allopurinol (ZYLOPRIM) 100 MG tablet TAKE 2 TABLETS BY MOUTH EVERY DAY ?Patient not taking: Reported on 03/19/2022 01/05/22   Lyndal Pulley, DO  ?atorvastatin (LIPITOR) 40 MG tablet TAKE 1 TABLET (40 MG TOTAL) BY MOUTH DAILY AT 6 PM. 10/16/21   Sherren Mocha, MD  ?cyclobenzaprine (FLEXERIL) 10 MG tablet Take 1 tablet (10 mg total) by mouth 3 (three) times daily as needed for muscle spasms. 12/31/21   Jessy Oto, MD  ?diclofenac Sodium (VOLTAREN) 1 %  GEL Place onto the skin.    [provider]  ?fluticasone furoate-vilanterol (BREO ELLIPTA) 100-25 MCG/ACT AEPB Inhale 1 puff into the lungs daily. 02/25/22   Margaretha Seeds, MD  ?fluticasone-salmeterol (ADVAIR HFA) [phone] MCG/ACT inhaler INHALE 2 PUFFS INTO THE LUNGS TWICE A DAY 11/02/21   Margaretha Seeds, MD  ?meloxicam (MOBIC) 15 MG tablet TAKE 1 TABLET (15 MG TOTAL) BY MOUTH DAILY. 02/22/22   Edrick Kins, DPM  ?rivaroxaban (XARELTO) 10 MG TABS tablet Take 1 tablet (10 mg total) by mouth daily.  05/11/19 03/19/22  Sherren Mocha, MD  ?traMADol (ULTRAM) 50 MG tablet Take 1 tablet (50 mg total) by mouth every 8 (eight) hours as needed. 12/31/21   Jessy Oto, MD  ? ? ?Physical Exam  ?  ?Vital Signs:  Andrew [name] does not have vital signs available for review today. ? ?Given telephonic nature of communication, physical exam is limited. ?AAOx3. NAD. Normal affect.  Speech and respirations are unlabored. ? ?Accessory Clinical Findings  ?  ?None ? ?Assessment & Plan  ?  ?1.  Preoperative Cardiovascular Risk Assessment: ? ?He does not have a history of ischemic heart disease or heart failure. He is able to complete more than 4.0 METS without angina.  ? ?Therefore, based on ACC/AHA guidelines, the patient would be at acceptable risk for the planned procedure without further cardiovascular testing.  ? ?The patient was advised that if she develops new symptoms prior to surgery to contact our office to arrange for a follow-up visit, and she verbalized understanding. ? ? ?2. Guidance for Andrew [name] hold ?Per our clinical pharmacist:  ?The patient may hold Xarelto for 2 days prior to procedure. ? ? ?A copy of this note will be routed to requesting surgeon. ? ?Time:   ?Today, I have spent 10 minutes with the patient with telehealth technology discussing medical history, symptoms, and management plan.   ? ? ?Ledora Bottcher, PA ? ?03/26/2022, 2:26 PM ?

## 2022-03-26 NOTE — Telephone Encounter (Signed)
We see this patient for DVT, we prescribe xarelto and have made recommendations for xarelto hold in the past.  ? ?Can you please provide xarelto hold guidance for Ectropion repair/probe nasolacrimal duct w/tube or stent right eye? ? ?I'm seeing him for clearance this afternoon. ? ?

## 2022-03-29 ENCOUNTER — Ambulatory Visit: Payer: Medicare Other

## 2022-03-29 DIAGNOSIS — M6281 Muscle weakness (generalized): Secondary | ICD-10-CM

## 2022-03-29 DIAGNOSIS — R600 Localized edema: Secondary | ICD-10-CM

## 2022-03-29 DIAGNOSIS — M25562 Pain in left knee: Secondary | ICD-10-CM

## 2022-03-29 DIAGNOSIS — M25662 Stiffness of left knee, not elsewhere classified: Secondary | ICD-10-CM

## 2022-03-29 DIAGNOSIS — R262 Difficulty in walking, not elsewhere classified: Secondary | ICD-10-CM

## 2022-03-29 NOTE — Therapy (Signed)
?OUTPATIENT PHYSICAL THERAPY TREATMENT NOTE ? ? ?Patient Name: Andrew [name] ?MRN: [id number] ?DOB:03-14-1951, 71 y.o., male ?Today's Date: 03/29/2022 ? ?PCP: Bernerd Limbo, MD ?REFERRING PROVIDER: Bernerd Limbo, MD ? ?END OF SESSION:  ? PT End of Session - 03/29/22 1026   ? ? Visit Number 5   ? Number of Visits 12   ? Date for PT Re-Evaluation 05/11/22   ? Authorization Type Medicare;  12 visits per Dr. Wynelle Link   ? PT Start Time 1018   ? PT Stop Time 1108   ? PT Time Calculation (min) 50 min   ? Activity Tolerance Patient tolerated treatment well   ? Behavior During Therapy [facility] for tasks assessed/performed   ? ?  ?  ? ?  ? ? ?Past Medical History:  ?Diagnosis Date  ? DVT (deep venous thrombosis) ([place])   ? right leg in 2011; prior injury to right foot  ? Echocardiogram 07/2021  ? Echocardiogram 8/22: EF 60-65, no RWMA, mild LVH, low normal RVSF, trivial AI, mild AV sclerosis w/o AS  ? HLD (hyperlipidemia)   ? Right foot injury   ? ?Past Surgical History:  ?Procedure Laterality Date  ? NO PAST SURGERIES    ? ?Patient Active Problem List  ? Diagnosis Date Noted  ? Acute bronchitis 02/18/2022  ? Loss of transverse plantar arch of right foot 12/17/2021  ? COVID-19 long hauler manifesting chronic cough 10/28/2021  ? History of COVID-19 10/28/2021  ? Effusion of left knee 09/09/2021  ? History of deep vein thrombosis (DVT) of lower extremity 05/13/2021  ? Degenerative cervical disc 01/16/2021  ? Right shoulder pain 01/16/2021  ? Degenerative arthritis of knee 05/28/2020  ? Spondylosis without myelopathy or radiculopathy, lumbar region 10/11/2016  ? Migraines 06/08/2016  ? Nuclear sclerotic cataract of both eyes 06/08/2016  ? Presbyopia 06/08/2016  ? Hypermetropia of both eyes 06/08/2016  ? Long term current use of anticoagulant 06/02/2016  ? Recurrent right inguinal hernia 12/05/2014  ? Chronic venous insufficiency 11/28/2014  ? Bradycardia 10/11/2012  ? Right leg DVT ([place]) 10/03/2012  ? HLD (hyperlipidemia) 07/25/2012   ? ?REFERRING DIAG: H73.428 (ICD-10-CM) - Presence of left artificial knee joint ?  ?THERAPY DIAG:  ?Acute pain of left knee ?  ?Stiffness of left knee, not elsewhere classified ?  ?Muscle weakness (generalized) ?  ?PERTINENT HISTORY: See above ?  ?PRECAUTIONS: none ?  ?SUBJECTIVE: Patient saw MD on Wednesday.  Had 80 degrees flexion at 2 week follow up.  On Friday he had 90 degrees.  Family member states he became constipated with increasing his pain meds so they have backed off on these.  Having a lot of pain with extension propping.   ?PAIN:  ?Are you having pain? Yes: Pain location: left knee 4/10 ?Pain description: aching ?Aggravating factors: static positions ?Relieving factors: medication ?  ?OBJECTIVE:  ?  ?DIAGNOSTIC FINDINGS: OA ?  ?PATIENT SURVEYS:  ?FOTO 44% ?  ?COGNITION: ?          Overall cognitive status: Within functional limits for tasks assessed               ?           ?  ?POSTURE:  ?Flexed knee in supine unable to fully extend ?  ?PALPATION: ?Purple bruising medial left thigh; dressing over surgical incision; moderate edema apparent  ?  ?LE ROM: ?  ?Active ROM Right ?4/4 Left ?4/4 Left  ?03/22/22  ?Hip flexion        ?  Hip extension        ?Hip abduction        ?Hip adduction        ?Hip internal rotation        ?Hip external rotation        ?Knee flexion 130 54 70  ?Knee extension 0 -25    ?Ankle dorsiflexion        ?Ankle plantarflexion        ?Ankle inversion        ?Ankle eversion        ? (Blank rows = not tested) ?  ?LE MMT:  Unable to do SLR ?  ?MMT Right ?4/4 Left ?4/4  ?Hip flexion 5 2  ?Hip extension      ?Hip abduction 5 3  ?Hip adduction      ?Hip internal rotation      ?Hip external rotation      ?Knee flexion 5 2  ?Knee extension 5 2  ?Ankle dorsiflexion      ?Ankle plantarflexion      ?Ankle inversion      ?Ankle eversion      ? (Blank rows = not tested) ?  ?  ?FUNCTIONAL TESTS:  ?Needs assist to lift leg on/off table  ?  ?GAIT: ?Distance walked: 100 ?Assistive device utilized:  Environmental consultant - 2 wheeled ?Level of assistance: Modified independence ?Comments:  ?  ?  ?  ?TODAY'S TREATMENT:  ?03/29/22 ?NuStep level 1 x 5 min with PT present to discuss pain control and how to manage constipation.  ?Standing at bar: toe raises, hip abd and ext all x 20 ?Standing with left foot in chair: AAROM for flexion of left knee x 10 hold 10 sec each ?Seated AA heel slides x 5 hold 10 sec each ?Supine quad set x 20 (slider under heel) ?Supine TKE with white foam roller x 20 with 2.5 lb  ?Supine SAQ with large blue bolster x 20 with 2.5  ?Supine SLR x 20 with 2.5 lb ?Game ready 3 snowflakes, medium pressure x8 min; pt in reclined position unable to tolerate supine with leg elevated at this time ?MANUAL: patellar mobs in extension with slight overpressure ?Educated patient and his brother on pain control once again and how to treat constipation vs. Backing off on pain meds.   ? ?03/26/22: ?Lt Quad set with PT overpressure 10x5 sec hold  ?Lt Heel slide with sheet assist x5 reps ?Seated edge of mat table with Lt knee end range flexion hold x60sec, increasing knee flexion x3 more times for 60 sec hold ?Seated Lt knee flexion with Rt LE assist 10x5 sec hold  ?Game ready 3 snowflakes, medium pressure x8 min; pt in reclined position unable to tolerate supine with leg elevated at this time ?MANUAL: Lt proximal fibular head mobilization grade II-III, STM Lt peroneals, lateral gastroc/soleus ?03/22/22: ?NuStep x 6 min (level 1) PTA present to discuss current status. ?Seated heel slides 2x20 ?Standing heel raises 10x, hip abduction 10x alt,  ?Knee flexion on second step 20x2 ?Game ready 15 min 3 flakes low compression. Pt did not do well with LE elevated, caused too much pain. Better with LE on mat table with small towel supporting knee.  ?MANUAL: gentle hamstring soft tissue work and retrograde massage for edema ?  ?  ?PATIENT EDUCATION:  ?Education details: Continue pain control and normalization of gait, using gentle  overpressure at home with extension exercises; discussed benefit of thigh high compression for swelling;  ?Person educated:  Patient and Caregiver ?Education method: Explanation, Demonstration, and Handouts ?Education comprehension: verbalized understanding and returned demonstration ?  ?  ?HOME EXERCISE PROGRAM: ?Access Code: [email or web address] ?URL: [email or web address] ?Date: 03/16/2022 ?Prepared by: Ruben Im ?  ?Exercises ?- Seated Heel Slide  - 3 x daily - 7 x weekly - 1 sets - 20 reps ?- Hip Flexor Stretch on Step  - 3 x daily - 7 x weekly - 1 sets - 20 reps ?- Backward Weight Shift and Opposite Arm Raise with Walker  - 3 x daily - 7 x weekly - 1 sets - 20 reps ?  ?ASSESSMENT: ?  ?CLINICAL IMPRESSION: ?Pt arrived with his AD, less antalgic gait. His brother was present to assist with translating.  He continues to struggle with pain control and ability to prop for extension.  We discussed ways to combat constipation but maintain good pain control.  However, patient and brother did not seem to be open to resuming better pain control.  He will likely have difficulty gaining extension without better pain control.  Pt would continue to benefit from education regarding using his AD as well as pain management and ROM.  ?  ?OBJECTIVE IMPAIRMENTS decreased activity tolerance, decreased mobility, difficulty walking, decreased ROM, decreased strength, increased edema, impaired perceived functional ability, and pain.  ?  ?ACTIVITY LIMITATIONS community activity and driving.  ?  ?PERSONAL FACTORS Language barrier and other affected body regions  affect patient's functional outcome.  ?  ?  ?REHAB POTENTIAL: Good ?  ?CLINICAL DECISION MAKING: Stable/uncomplicated ?  ?EVALUATION COMPLEXITY: Low ?  ?  ?GOALS: ?Goals reviewed with patient? Yes ?  ?SHORT TERM GOALS: Target date: 04/13/2022 ?  ?The patient will demonstrate knowledge of basic self care strategies and exercises to promote healing   ?Baseline: ?Goal status:  INITIAL ?  ?2.  The patient will have improved knee ROM 10-90 degrees for improved ability to get in/out of the car ?Baseline:  ?Goal status: INITIAL ?  ?3.  The patient will be able to walk household distances with

## 2022-04-01 ENCOUNTER — Ambulatory Visit: Payer: Medicare Other | Admitting: Pulmonary Disease

## 2022-04-02 ENCOUNTER — Other Ambulatory Visit: Payer: Self-pay | Admitting: Specialist

## 2022-04-02 ENCOUNTER — Ambulatory Visit: Payer: Medicare Other | Admitting: Physical Therapy

## 2022-04-02 ENCOUNTER — Encounter: Payer: Self-pay | Admitting: Physical Therapy

## 2022-04-02 DIAGNOSIS — M25662 Stiffness of left knee, not elsewhere classified: Secondary | ICD-10-CM

## 2022-04-02 DIAGNOSIS — M25562 Pain in left knee: Secondary | ICD-10-CM | POA: Diagnosis not present

## 2022-04-02 DIAGNOSIS — R600 Localized edema: Secondary | ICD-10-CM

## 2022-04-02 DIAGNOSIS — R2689 Other abnormalities of gait and mobility: Secondary | ICD-10-CM

## 2022-04-02 DIAGNOSIS — M6281 Muscle weakness (generalized): Secondary | ICD-10-CM

## 2022-04-02 DIAGNOSIS — R262 Difficulty in walking, not elsewhere classified: Secondary | ICD-10-CM

## 2022-04-02 NOTE — Therapy (Signed)
?OUTPATIENT PHYSICAL THERAPY TREATMENT NOTE ? ? ?Patient Name: Andrew [name] ?MRN: [id number] ?DOB:March 03, 1951, 71 y.o., male ?Today's Date: 04/02/2022 ? ?PCP: Bernerd Limbo, MD ?REFERRING PROVIDER: Gaynelle Arabian, MD ? ?END OF SESSION:  ? PT End of Session - 04/02/22 1025   ? ? Visit Number 6   ? Number of Visits 12   ? Date for PT Re-Evaluation 05/11/22   ? Authorization Type Medicare;  12 visits per Dr. Wynelle Link   ? PT Start Time 1019   ? PT Stop Time 1115   ? PT Time Calculation (min) 56 min   ? Activity Tolerance Patient tolerated treatment well   ? Behavior During Therapy [facility] for tasks assessed/performed   ? ?  ?  ? ?  ? ? ? ?Past Medical History:  ?Diagnosis Date  ? DVT (deep venous thrombosis) ([place])   ? right leg in 2011; prior injury to right foot  ? Echocardiogram 07/2021  ? Echocardiogram 8/22: EF 60-65, no RWMA, mild LVH, low normal RVSF, trivial AI, mild AV sclerosis w/o AS  ? HLD (hyperlipidemia)   ? Right foot injury   ? ?Past Surgical History:  ?Procedure Laterality Date  ? NO PAST SURGERIES    ? ?Patient Active Problem List  ? Diagnosis Date Noted  ? Acute bronchitis 02/18/2022  ? Loss of transverse plantar arch of right foot 12/17/2021  ? COVID-19 long hauler manifesting chronic cough 10/28/2021  ? History of COVID-19 10/28/2021  ? Effusion of left knee 09/09/2021  ? History of deep vein thrombosis (DVT) of lower extremity 05/13/2021  ? Degenerative cervical disc 01/16/2021  ? Right shoulder pain 01/16/2021  ? Degenerative arthritis of knee 05/28/2020  ? Spondylosis without myelopathy or radiculopathy, lumbar region 10/11/2016  ? Migraines 06/08/2016  ? Nuclear sclerotic cataract of both eyes 06/08/2016  ? Presbyopia 06/08/2016  ? Hypermetropia of both eyes 06/08/2016  ? Long term current use of anticoagulant 06/02/2016  ? Recurrent right inguinal hernia 12/05/2014  ? Chronic venous insufficiency 11/28/2014  ? Bradycardia 10/11/2012  ? Right leg DVT ([place]) 10/03/2012  ? HLD (hyperlipidemia) 07/25/2012   ? ?REFERRING DIAG: H96.222 (ICD-10-CM) - Presence of left artificial knee joint ?  ?THERAPY DIAG:  ?Acute pain of left knee ?  ?Stiffness of left knee, not elsewhere classified ?  ?Muscle weakness (generalized) ?  ?PERTINENT HISTORY: See above ?  ?PRECAUTIONS: none ?  ?SUBJECTIVE: Pt ambulates into PT clinic with no asst device today.   ? ?PAIN:  ?Are you having pain? No  ?Pain location: left knee  ?Pain description: ?Aggravating factors: static positions ?Relieving factors: medication ?  ?OBJECTIVE:  ?  ?DIAGNOSTIC FINDINGS: OA ?  ?PATIENT SURVEYS:  ?FOTO 44% ?  ?COGNITION: ?          Overall cognitive status: Within functional limits for tasks assessed               ?           ?  ?POSTURE:  ?Flexed knee in supine unable to fully extend ?  ?PALPATION: ?Purple bruising medial left thigh; dressing over surgical incision; moderate edema apparent  ?  ?LE ROM: ?  ?Active ROM Right ?4/4 Left ?4/4 Left  ?03/22/22  ?Hip flexion        ?Hip extension        ?Hip abduction        ?Hip adduction        ?Hip internal rotation        ?Hip  external rotation        ?Knee flexion 130 54 70  ?Knee extension 0 -25    ?Ankle dorsiflexion        ?Ankle plantarflexion        ?Ankle inversion        ?Ankle eversion        ? (Blank rows = not tested) ?  ?LE MMT:  Unable to do SLR ?  ?MMT Right ?4/4 Left ?4/4  ?Hip flexion 5 2  ?Hip extension      ?Hip abduction 5 3  ?Hip adduction      ?Hip internal rotation      ?Hip external rotation      ?Knee flexion 5 2  ?Knee extension 5 2  ?Ankle dorsiflexion      ?Ankle plantarflexion      ?Ankle inversion      ?Ankle eversion      ? (Blank rows = not tested) ?  ?  ?FUNCTIONAL TESTS:  ?Needs assist to lift leg on/off table  ?  ?GAIT: ?Distance walked: 100 ?Assistive device utilized: Environmental consultant - 2 wheeled ?Level of assistance: Modified independence ?Comments:  ?  ?  ?  ?TODAY'S TREATMENT:  ? 04/02/22:  ? NuStep level 1 x 7 min with PTA present to discuss pain control and how to manage constipation.   ?Standing at bar: toe raises, hip abd and ext all x 20 ?Standing with left foot on second step:  AAROM for flexion of left knee x 20 hold ?Seated AA heel slides x 5 hold 10 sec each ?Seated hamstring stretch Lt: 3x20 sec ?Leg press: Bil 50# 2x10 VC for control and knee extension: second set added LT ankle DF. ?Seated LAQ 0# 3x10 ?Supine SAQ x 20 with3 lb ?Game ready 3 snowflakes, medium pressure x8 min; pt in reclined position unable to tolerate supine with leg elevated at this time ?MANUAL: gentle knee extension stretching:attempted but stopped secondary to pain and pt got cramp in hamstring ? ? 03/29/22 ?NuStep level 1 x 5 min with PT present to discuss pain control and how to manage constipation.  ?Standing at bar: toe raises, hip abd and ext all x 20 ?Standing with left foot in chair: AAROM for flexion of left knee x 10 hold 10 sec each ?Seated AA heel slides x 5 hold 10 sec each ?Supine quad set x 20 (slider under heel) ?Supine TKE with white foam roller x 20 with 2.5 lb  ?Supine SAQ with large blue bolster x 20 with 2.5  ?Supine SLR x 20 with 2.5 lb ?Game ready 3 snowflakes, medium pressure x8 min; pt in reclined position unable to tolerate supine with leg elevated at this time ?MANUAL: patellar mobs in extension with slight overpressure ?Educated patient and his brother on pain control once again and how to treat constipation vs. Backing off on pain meds.   ? ?03/26/22: ?Lt Quad set with PT overpressure 10x5 sec hold  ?Lt Heel slide with sheet assist x5 reps ?Seated edge of mat table with Lt knee end range flexion hold x60sec, increasing knee flexion x3 more times for 60 sec hold ?Seated Lt knee flexion with Rt LE assist 10x5 sec hold  ?Game ready 3 snowflakes, medium pressure x8 min; pt in reclined position unable to tolerate supine with leg elevated at this time ?MANUAL: Lt proximal fibular head mobilization grade II-III, STM Lt peroneals, lateral gastroc/soleus ?03/22/22: ?NuStep x 6 min (level 1) PTA  present to discuss current status. ?  Seated heel slides 2x20 ?Standing heel raises 10x, hip abduction 10x alt,  ?Knee flexion on second step 20x2 ?Game ready 15 min 3 flakes low compression. Pt did not do well with LE elevated, caused too much pain. Better with LE on mat table with small towel supporting knee.  ?MANUAL: gentle hamstring soft tissue work and retrograde massage for edema ?  ?  ?PATIENT EDUCATION:  ?Education details: Continue pain control and normalization of gait, using gentle overpressure at home with extension exercises; discussed benefit of thigh high compression for swelling;  ?Person educated: Patient and Caregiver ?Education method: Explanation, Demonstration, and Handouts ?Education comprehension: verbalized understanding and returned demonstration ?  ?  ?HOME EXERCISE PROGRAM: ?Access Code: [email or web address] ?URL: [email or web address] ?Date: 03/16/2022 ?Prepared by: Ruben Im ?  ?Exercises ?- Seated Heel Slide  - 3 x daily - 7 x weekly - 1 sets - 20 reps ?- Hip Flexor Stretch on Step  - 3 x daily - 7 x weekly - 1 sets - 20 reps ?- Backward Weight Shift and Opposite Arm Raise with Walker  - 3 x daily - 7 x weekly - 1 sets - 20 reps ? 04/02/22: Seated Hamstring Stretch  - 3 x daily - 7 x weekly - 5 reps - 10 hold ?ASSESSMENT: ?  ?CLINICAL IMPRESSION: ?Pt arrives today for PT accompanied by his brother. Pt arrives with no Asst device and reports he is walking more for exercise. Pt was able to tolerate leg press today,increased weight on SAQ and LAQ. Still lacks knee extension. Did not tolerate stretchin into extension.  ? ?OBJECTIVE IMPAIRMENTS decreased activity tolerance, decreased mobility, difficulty walking, decreased ROM, decreased strength, increased edema, impaired perceived functional ability, and pain.  ?  ?ACTIVITY LIMITATIONS community activity and driving.  ?  ?PERSONAL FACTORS Language barrier and other affected body regions  affect patient's functional outcome.  ?  ?   ?REHAB POTENTIAL: Good ?  ?CLINICAL DECISION MAKING: Stable/uncomplicated ?  ?EVALUATION COMPLEXITY: Low ?  ?  ?GOALS: ?Goals reviewed with patient? Yes ?  ?SHORT TERM GOALS: [place] date: 04/13/2022 ?  ?The [facility]ien

## 2022-04-05 ENCOUNTER — Encounter: Payer: Self-pay | Admitting: Physical Therapy

## 2022-04-05 ENCOUNTER — Ambulatory Visit: Payer: Medicare Other | Admitting: Physical Therapy

## 2022-04-05 DIAGNOSIS — M25662 Stiffness of left knee, not elsewhere classified: Secondary | ICD-10-CM

## 2022-04-05 DIAGNOSIS — M25562 Pain in left knee: Secondary | ICD-10-CM

## 2022-04-05 DIAGNOSIS — R600 Localized edema: Secondary | ICD-10-CM

## 2022-04-05 DIAGNOSIS — M6281 Muscle weakness (generalized): Secondary | ICD-10-CM

## 2022-04-05 DIAGNOSIS — R262 Difficulty in walking, not elsewhere classified: Secondary | ICD-10-CM

## 2022-04-05 NOTE — Therapy (Signed)
?OUTPATIENT PHYSICAL THERAPY TREATMENT NOTE ? ? ?Patient Name: Andrew [name] ?MRN: [id number] ?DOB:Mar 08, 1951, 71 y.o., male ?Today's Date: 04/05/2022 ? ?PCP: Bernerd Limbo, MD ?REFERRING PROVIDER: Gaynelle Arabian, MD ? ?END OF SESSION:  ? PT End of Session - 04/05/22 0931   ? ? Visit Number 7   ? Number of Visits 12   ? Date for PT Re-Evaluation 05/11/22   ? Authorization Type Medicare;  12 visits per Dr. Wynelle Link   ? PT Start Time [phone]   ? PT Stop Time 1030   ? PT Time Calculation (min) 59 min   ? Activity Tolerance Patient tolerated treatment well   ? Behavior During Therapy [facility] for tasks assessed/performed   ? ?  ?  ? ?  ? ? ? ?Past Medical History:  ?Diagnosis Date  ? DVT (deep venous thrombosis) ([place])   ? right leg in 2011; prior injury to right foot  ? Echocardiogram 07/2021  ? Echocardiogram 8/22: EF 60-65, no RWMA, mild LVH, low normal RVSF, trivial AI, mild AV sclerosis w/o AS  ? HLD (hyperlipidemia)   ? Right foot injury   ? ?Past Surgical History:  ?Procedure Laterality Date  ? NO PAST SURGERIES    ? ?Patient Active Problem List  ? Diagnosis Date Noted  ? Acute bronchitis 02/18/2022  ? Loss of transverse plantar arch of right foot 12/17/2021  ? COVID-19 long hauler manifesting chronic cough 10/28/2021  ? History of COVID-19 10/28/2021  ? Effusion of left knee 09/09/2021  ? History of deep vein thrombosis (DVT) of lower extremity 05/13/2021  ? Degenerative cervical disc 01/16/2021  ? Right shoulder pain 01/16/2021  ? Degenerative arthritis of knee 05/28/2020  ? Spondylosis without myelopathy or radiculopathy, lumbar region 10/11/2016  ? Migraines 06/08/2016  ? Nuclear sclerotic cataract of both eyes 06/08/2016  ? Presbyopia 06/08/2016  ? Hypermetropia of both eyes 06/08/2016  ? Long term current use of anticoagulant 06/02/2016  ? Recurrent right inguinal hernia 12/05/2014  ? Chronic venous insufficiency 11/28/2014  ? Bradycardia 10/11/2012  ? Right leg DVT ([place]) 10/03/2012  ? HLD (hyperlipidemia) 07/25/2012   ? ?REFERRING DIAG: H84.696 (ICD-10-CM) - Presence of left artificial knee joint ?  ?THERAPY DIAG:  ?Acute pain of left knee ?  ?Stiffness of left knee, not elsewhere classified ?  ?Muscle weakness (generalized) ?  ?PERTINENT HISTORY: See above ?  ?PRECAUTIONS: none ?  ?SUBJECTIVE: "I am doing ok"   ? ?PAIN:  ?Are you having pain? No  ?Pain location: left knee  ?Pain description: ?Aggravating factors: static positions ?Relieving factors: medication ?  ?OBJECTIVE:  ?  ?DIAGNOSTIC FINDINGS: OA ?  ?PATIENT SURVEYS:  ?FOTO 44% ?  ?COGNITION: ?          Overall cognitive status: Within functional limits for tasks assessed               ?           ?  ?POSTURE:  ?Flexed knee in supine unable to fully extend ?  ?PALPATION: ?Purple bruising medial left thigh; dressing over surgical incision; moderate edema apparent  ?  ?LE ROM: ?  ?Active ROM Right ?4/4 Left ?4/4 Left  ?03/22/22 Left ?04/05/22  ?Hip flexion         ?Hip extension         ?Hip abduction         ?Hip adduction         ?Hip internal rotation         ?  Hip external rotation         ?Knee flexion 130 54 70 103  ?Knee extension 0 -25     ?Ankle dorsiflexion         ?Ankle plantarflexion         ?Ankle inversion         ?Ankle eversion         ? (Blank rows = not tested) ?  ?LE MMT:  Unable to do SLR ?  ?MMT Right ?4/4 Left ?4/4  ?Hip flexion 5 2  ?Hip extension      ?Hip abduction 5 3  ?Hip adduction      ?Hip internal rotation      ?Hip external rotation      ?Knee flexion 5 2  ?Knee extension 5 2  ?Ankle dorsiflexion      ?Ankle plantarflexion      ?Ankle inversion      ?Ankle eversion      ? (Blank rows = not tested) ?  ?  ?FUNCTIONAL TESTS:  ?Needs assist to lift leg on/off table  ?  ?GAIT: ?Distance walked: 100 ?Assistive device utilized: Environmental consultant - 2 wheeled ?Level of assistance: Modified independence ?Comments:  ?  ?  ?  ?TODAY'S TREATMENT:  ? ?04/05/22" ?NuStep level 2 x 7 min with PTA present to discuss pain control and how to manage constipation.  ?Adductor  ball squeeze 2x10 ?6" step ups 10x with UE on rails ?Standing with left foot on second step:  AAROM for flexion of left knee x 20 hold ?Seated AA heel slides x 5 hold 10 sec each ?Seated hamstring stretch Lt: 3x20 sec ?Leg press: Bil 55# 2x10 VC for control and knee extension: second set added LT ankle DF.LTLE only 30# 10x ?Seated LAQ 2# 3x10 ?Supine SAQ x 20 with3 lb ?Game ready 3 snowflakes, medium pressure x 10 min; pt in reclined position unable to tolerate supine with leg elevated at this time ? 04/02/22:  ? NuStep level 1 x 7 min with PTA present to discuss pain control and how to manage constipation.  ?Standing at bar: toe raises, hip abd and ext all x 20 ?Standing with left foot on second step:  AAROM for flexion of left knee x 20 hold ?Seated AA heel slides x 5 hold 10 sec each ?Seated hamstring stretch Lt: 3x20 sec ?Leg press: Bil 50# 2x10 VC for control and knee extension: second set added LT ankle DF. ?Seated LAQ 0# 3x10 ?Supine SAQ x 20 with3 lb ?Game ready 3 snowflakes, medium pressure x8 min; pt in reclined position unable to tolerate supine with leg elevated at this time ?MANUAL: gentle knee extension stretching:attempted but stopped secondary to pain and pt got cramp in hamstring ? ? 03/29/22 ?NuStep level 1 x 5 min with PT present to discuss pain control and how to manage constipation.  ?Standing at bar: toe raises, hip abd and ext all x 20 ?Standing with left foot in chair: AAROM for flexion of left knee x 10 hold 10 sec each ?Seated AA heel slides x 5 hold 10 sec each ?Supine quad set x 20 (slider under heel) ?Supine TKE with white foam roller x 20 with 2.5 lb  ?Supine SAQ with large blue bolster x 20 with 2.5  ?Supine SLR x 20 with 2.5 lb ?Game ready 3 snowflakes, medium pressure x8 min; pt in reclined position unable to tolerate supine with leg elevated at this time ?MANUAL: patellar mobs in extension with slight overpressure ?  Educated patient and his brother on pain control once again and how to  treat constipation vs. Backing off on pain meds.   ? ?03/26/22: ?Lt Quad set with PT overpressure 10x5 sec hold  ?Lt Heel slide with sheet assist x5 reps ?Seated edge of mat table with Lt knee end range flexion hold x60sec, increasing knee flexion x3 more times for 60 sec hold ?Seated Lt knee flexion with Rt LE assist 10x5 sec hold  ?Game ready 3 snowflakes, medium pressure x8 min; pt in reclined position unable to tolerate supine with leg elevated at this time ?MANUAL: Lt proximal fibular head mobilization grade II-III, STM Lt peroneals, lateral gastroc/soleus ?03/22/22: ?NuStep x 6 min (level 1) PTA present to discuss current status. ?Seated heel slides 2x20 ?Standing heel raises 10x, hip abduction 10x alt,  ?Knee flexion on second step 20x2 ?Game ready 15 min 3 flakes low compression. Pt did not do well with LE elevated, caused too much pain. Better with LE on mat table with small towel supporting knee.  ?MANUAL: gentle hamstring soft tissue work and retrograde massage for edema ?  ?  ?PATIENT EDUCATION:  ?Education details: Continue pain control and normalization of gait, using gentle overpressure at home with extension exercises; discussed benefit of thigh high compression for swelling;  ?Person educated: Patient and Caregiver ?Education method: Explanation, Demonstration, and Handouts ?Education comprehension: verbalized understanding and returned demonstration ?  ?  ?HOME EXERCISE PROGRAM: ?Access Code: [email or web address] ?URL: [email or web address] ?Date: 03/16/2022 ?Prepared by: Ruben Im ?  ?Exercises ?- Seated Heel Slide  - 3 x daily - 7 x weekly - 1 sets - 20 reps ?- Hip Flexor Stretch on Step  - 3 x daily - 7 x weekly - 1 sets - 20 reps ?- Backward Weight Shift and Opposite Arm Raise with Walker  - 3 x daily - 7 x weekly - 1 sets - 20 reps ? 04/02/22: Seated Hamstring Stretch  - 3 x daily - 7 x weekly - 5 reps - 10 hold ?ASSESSMENT: ?  ?CLINICAL IMPRESSION: ?Pt arrives today for PT accompanied by  his sister in law. Pt arrives with no Asst device and reports he is walking more for exercise. Edema continues to lessen and as a consequence knee AROM continues to improve. Pt achieved 103 degrees of

## 2022-04-06 ENCOUNTER — Other Ambulatory Visit: Payer: Self-pay | Admitting: Family Medicine

## 2022-04-08 ENCOUNTER — Ambulatory Visit: Payer: Medicare Other

## 2022-04-08 DIAGNOSIS — M25562 Pain in left knee: Secondary | ICD-10-CM

## 2022-04-08 DIAGNOSIS — M6281 Muscle weakness (generalized): Secondary | ICD-10-CM

## 2022-04-08 DIAGNOSIS — M25662 Stiffness of left knee, not elsewhere classified: Secondary | ICD-10-CM

## 2022-04-08 DIAGNOSIS — R262 Difficulty in walking, not elsewhere classified: Secondary | ICD-10-CM

## 2022-04-08 DIAGNOSIS — R600 Localized edema: Secondary | ICD-10-CM

## 2022-04-08 NOTE — Therapy (Signed)
?OUTPATIENT PHYSICAL THERAPY TREATMENT NOTE ? ? ?Patient Name: Andrew [name] ?MRN: [id number] ?DOB:Jun 25, 1951, 71 y.o., male ?Today's Date: 04/08/2022 ? ?PCP: Bernerd Limbo, MD ?REFERRING PROVIDER: Bernerd Limbo, MD ? ?END OF SESSION:  ? PT End of Session - 04/08/22 1022   ? ? Visit Number 8   ? Number of Visits 12   ? Date for PT Re-Evaluation 05/11/22   ? Authorization Type Medicare;  12 visits per Dr. Wynelle Link   ? PT Start Time 1015   ? PT Stop Time 1100   ? PT Time Calculation (min) 45 min   ? Activity Tolerance Patient tolerated treatment well   ? Behavior During Therapy [facility] for tasks assessed/performed   ? ?  ?  ? ?  ? ? ? ?Past Medical History:  ?Diagnosis Date  ? DVT (deep venous thrombosis) ([place])   ? right leg in 2011; prior injury to right foot  ? Echocardiogram 07/2021  ? Echocardiogram 8/22: EF 60-65, no RWMA, mild LVH, low normal RVSF, trivial AI, mild AV sclerosis w/o AS  ? HLD (hyperlipidemia)   ? Right foot injury   ? ?Past Surgical History:  ?Procedure Laterality Date  ? NO PAST SURGERIES    ? ?Patient Active Problem List  ? Diagnosis Date Noted  ? Acute bronchitis 02/18/2022  ? Loss of transverse plantar arch of right foot 12/17/2021  ? COVID-19 long hauler manifesting chronic cough 10/28/2021  ? History of COVID-19 10/28/2021  ? Effusion of left knee 09/09/2021  ? History of deep vein thrombosis (DVT) of lower extremity 05/13/2021  ? Degenerative cervical disc 01/16/2021  ? Right shoulder pain 01/16/2021  ? Degenerative arthritis of knee 05/28/2020  ? Spondylosis without myelopathy or radiculopathy, lumbar region 10/11/2016  ? Migraines 06/08/2016  ? Nuclear sclerotic cataract of both eyes 06/08/2016  ? Presbyopia 06/08/2016  ? Hypermetropia of both eyes 06/08/2016  ? Long term current use of anticoagulant 06/02/2016  ? Recurrent right inguinal hernia 12/05/2014  ? Chronic venous insufficiency 11/28/2014  ? Bradycardia 10/11/2012  ? Right leg DVT ([place]) 10/03/2012  ? HLD (hyperlipidemia) 07/25/2012   ? ?REFERRING DIAG: G53.646 (ICD-10-CM) - Presence of left artificial knee joint ?  ?THERAPY DIAG:  ?Acute pain of left knee ?  ?Stiffness of left knee, not elsewhere classified ?  ?Muscle weakness (generalized) ?  ?PERTINENT HISTORY: See above ?  ?PRECAUTIONS: none ?  ?SUBJECTIVE: Patient states he is doing fine.  No complaints.  Walking better.  No assistive device needed.    ? ?PAIN:  ?Are you having pain? No  ?Pain location: left knee  ?Pain description: ?Aggravating factors: static positions ?Relieving factors: medication ?  ?OBJECTIVE:  ?  ?DIAGNOSTIC FINDINGS: OA ?  ?PATIENT SURVEYS:  ?FOTO 44% ?  ?COGNITION: ?          Overall cognitive status: Within functional limits for tasks assessed               ?           ?  ?POSTURE:  ?Flexed knee in supine unable to fully extend ?  ?PALPATION: ?Purple bruising medial left thigh; dressing over surgical incision; moderate edema apparent  ?  ?LE ROM: ?  ?Active ROM Right ?4/4 Left ?4/4 Left  ?03/22/22 Left ?04/05/22  ?Hip flexion         ?Hip extension         ?Hip abduction         ?Hip adduction         ?  Hip internal rotation         ?Hip external rotation         ?Knee flexion 130 54 70 103  ?Knee extension 0 -25     ?Ankle dorsiflexion         ?Ankle plantarflexion         ?Ankle inversion         ?Ankle eversion         ? (Blank rows = not tested) ?  ?LE MMT:  Unable to do SLR ?  ?MMT Right ?4/4 Left ?4/4  ?Hip flexion 5 2  ?Hip extension      ?Hip abduction 5 3  ?Hip adduction      ?Hip internal rotation      ?Hip external rotation      ?Knee flexion 5 2  ?Knee extension 5 2  ?Ankle dorsiflexion      ?Ankle plantarflexion      ?Ankle inversion      ?Ankle eversion      ? (Blank rows = not tested) ?  ?  ?FUNCTIONAL TESTS:  ?Needs assist to lift leg on/off table  ?  ?GAIT: ?Distance walked: 100 ?Assistive device utilized: Environmental consultant - 2 wheeled ?Level of assistance: Modified independence ?Comments:  ?  ?  ?  ?TODAY'S TREATMENT:  ? ?04/08/22 ?NuStep level 2 x 7 min with  PTA present to discuss pain control and how to manage constipation.  ?Adductor ball squeeze 2x10 ?Seated LAQ 5# 3x10 ?Seated hamstring stretch Lt: 3x20 sec (added footstool) ?Leg press: Bil 55# 2x10 VC for control and knee extension: second set left only 30# 10x (vc's for striving for terminal extension) ?Stair training encouraging reciprocal gait ascending descending x 3 with lengthy education on eccentric control ?Sit to stand with verbal cues for even stance and proper weight shift even bilaterally 2 x 10 ?Gait training without a.d. x 200 feet.  Verbal cues for increased step length and heel strike as well as proper equal weight shifting.   ?04/05/22" ?NuStep level 2 x 7 min with PTA present to discuss pain control and how to manage constipation.  ?Adductor ball squeeze 2x10 ?6" step ups 10x with UE on rails ?Standing with left foot on second step:  AAROM for flexion of left knee x 20 hold ?Seated AA heel slides x 5 hold 10 sec each ?Seated hamstring stretch Lt: 3x20 sec ?Leg press: Bil 55# 2x10 VC for control and knee extension: second set added LT ankle DF.LTLE only 30# 10x ?Seated LAQ 2# 3x10 ?Supine SAQ x 20 with3 lb ?Game ready 3 snowflakes, medium pressure x 10 min; pt in reclined position unable to tolerate supine with leg elevated at this time ? 04/02/22:  ? NuStep level 1 x 7 min with PTA present to discuss pain control and how to manage constipation.  ?Standing at bar: toe raises, hip abd and ext all x 20 ?Standing with left foot on second step:  AAROM for flexion of left knee x 20 hold ?Seated AA heel slides x 5 hold 10 sec each ?Seated hamstring stretch Lt: 3x20 sec ?Leg press: Bil 50# 2x10 VC for control and knee extension: second set added LT ankle DF. ?Seated LAQ 0# 3x10 ?Supine SAQ x 20 with3 lb ?Game ready 3 snowflakes, medium pressure x8 min; pt in reclined position unable to tolerate supine with leg elevated at this time ?MANUAL: gentle knee extension stretching:attempted but stopped secondary to  pain and pt got cramp in hamstring ? ?  ?  ?  PATIENT EDUCATION:  ?Education details: Continue pain control and normalization of gait, using gentle overpressure at home with extension exercises; discussed benefit of thigh high compression for swelling;  ?Person educated: Patient and Caregiver ?Education method: Explanation, Demonstration, and Handouts ?Education comprehension: verbalized understanding and returned demonstration ?  ?  ?HOME EXERCISE PROGRAM: ?Access Code: [email or web address] ?URL: [email or web address] ?Date: 03/16/2022 ?Prepared by: Ruben Im ?  ?Exercises ?- Seated Heel Slide  - 3 x daily - 7 x weekly - 1 sets - 20 reps ?- Hip Flexor Stretch on Step  - 3 x daily - 7 x weekly - 1 sets - 20 reps ?- Backward Weight Shift and Opposite Arm Raise with Walker  - 3 x daily - 7 x weekly - 1 sets - 20 reps ? 04/02/22: Seated Hamstring Stretch  - 3 x daily - 7 x weekly - 5 reps - 10 hold ?ASSESSMENT: ?  ?CLINICAL IMPRESSION: ?Pt is progressing appropriately.  He is gaining extension weekly.  He was able to do reciprocal gait on ascending and descending stairs with ease.  He would benefit from continued skilled PT for ROM and quad strengthening.    ? ?OBJECTIVE IMPAIRMENTS decreased activity tolerance, decreased mobility, difficulty walking, decreased ROM, decreased strength, increased edema, impaired perceived functional ability, and pain.  ?  ?ACTIVITY LIMITATIONS community activity and driving.  ?  ?PERSONAL FACTORS Language barrier and other affected body regions  affect patient's functional outcome.  ?  ?  ?REHAB POTENTIAL: Good ?  ?CLINICAL DECISION MAKING: Stable/uncomplicated ?  ?EVALUATION COMPLEXITY: Low ?  ?  ?GOALS: ?Goals reviewed with patient? Yes ?  ?SHORT TERM GOALS: Target date: 04/13/2022 ?  ?The patient will demonstrate knowledge of basic self care strategies and exercises to promote healing   ?Baseline: ?Goal status: INITIAL ?  ?2.  The patient will have improved knee ROM 10-90 degrees  for improved ability to get in/out of the car ?Baseline:  ?Goal status: INITIAL ?  ?3.  The patient will be able to walk household distances with a cane ?Baseline:  ?Goal status: INITIAL ?  ?4.  The pati

## 2022-04-12 ENCOUNTER — Ambulatory Visit (INDEPENDENT_AMBULATORY_CARE_PROVIDER_SITE_OTHER): Payer: Medicare Other | Admitting: Cardiovascular Disease

## 2022-04-12 ENCOUNTER — Ambulatory Visit: Payer: Medicare Other | Attending: Orthopedic Surgery

## 2022-04-12 ENCOUNTER — Encounter: Payer: Self-pay | Admitting: Cardiovascular Disease

## 2022-04-12 VITALS — BP 120/80 | HR 68 | Ht 70.0 in | Wt 192.0 lb

## 2022-04-12 DIAGNOSIS — R0602 Shortness of breath: Secondary | ICD-10-CM | POA: Diagnosis not present

## 2022-04-12 DIAGNOSIS — M25662 Stiffness of left knee, not elsewhere classified: Secondary | ICD-10-CM | POA: Diagnosis present

## 2022-04-12 DIAGNOSIS — R2689 Other abnormalities of gait and mobility: Secondary | ICD-10-CM | POA: Insufficient documentation

## 2022-04-12 DIAGNOSIS — M25562 Pain in left knee: Secondary | ICD-10-CM | POA: Insufficient documentation

## 2022-04-12 DIAGNOSIS — R262 Difficulty in walking, not elsewhere classified: Secondary | ICD-10-CM | POA: Insufficient documentation

## 2022-04-12 DIAGNOSIS — E782 Mixed hyperlipidemia: Secondary | ICD-10-CM

## 2022-04-12 DIAGNOSIS — Z86718 Personal history of other venous thrombosis and embolism: Secondary | ICD-10-CM | POA: Diagnosis not present

## 2022-04-12 DIAGNOSIS — M6281 Muscle weakness (generalized): Secondary | ICD-10-CM | POA: Diagnosis present

## 2022-04-12 DIAGNOSIS — I82409 Acute embolism and thrombosis of unspecified deep veins of unspecified lower extremity: Secondary | ICD-10-CM | POA: Diagnosis not present

## 2022-04-12 DIAGNOSIS — R252 Cramp and spasm: Secondary | ICD-10-CM | POA: Insufficient documentation

## 2022-04-12 MED ORDER — RIVAROXABAN 10 MG PO TABS: 10.0000 mg | ORAL_TABLET | Freq: Every day | ORAL | 3 refills | Status: DC

## 2022-04-12 NOTE — Progress Notes (Signed)
?Cardiology Office Note:   ? ?Date:  04/12/2022  ? ?ID:  Andrew [name], DOB [birth year], MRN [id number] ? ?PCP:  Bernerd Limbo, MD ?  ?[facility] Providers ?Cardiologist:  Sherren Mocha, MD    ? ?Referring MD: Bernerd Limbo, MD  ? ?Chief Complaint  ?Patient presents with  ? Hyperlipidemia  ? ? ?History of Present Illness:   ? ?Andrew [name] is a 71 y.o. male with a hx of: ?Hx of DVT ?Chronic anticoagulation  ?Chronic leg edema  ?Hyperlipidemia  ? ?The patient is here with his brother today.  He has been doing well from a cardiac perspective.  He had his left knee surgery recently and is recovering from this.  He is starting back into a walking program.  He has had no chest pain, chest pressure, orthopnea, or PND.  No heart palpitations.  He has mild exertional dyspnea which has been related to deconditioning in the past with no change.  The patient has chronic leg swelling and wears compression stockings.  No interval complaints or cardiac concerns identified today.  He has tolerated long-term rivaroxaban for recurrent DVT without any bleeding problems. ?  ? ? ?Past Medical History:  ?Diagnosis Date  ? DVT (deep venous thrombosis) ([place])   ? right leg in 2011; prior injury to right foot  ? Echocardiogram 07/2021  ? Echocardiogram 8/22: EF 60-65, no RWMA, mild LVH, low normal RVSF, trivial AI, mild AV sclerosis w/o AS  ? HLD (hyperlipidemia)   ? Right foot injury   ? ? ?Past Surgical History:  ?Procedure Laterality Date  ? NO PAST SURGERIES    ? ? ?Current Medications: ?Current Meds  ?Medication Sig  ? allopurinol (ZYLOPRIM) 100 MG tablet TAKE 2 TABLETS BY MOUTH EVERY DAY  ? atorvastatin (LIPITOR) 40 MG tablet TAKE 1 TABLET (40 MG TOTAL) BY MOUTH DAILY AT 6 PM.  ? cyclobenzaprine (FLEXERIL) 10 MG tablet Take 1 tablet (10 mg total) by mouth 3 (three) times daily as needed for muscle spasms.  ? diclofenac Sodium (VOLTAREN) 1 % GEL Place onto the skin.  ? fluticasone furoate-vilanterol (BREO ELLIPTA) 100-25 MCG/ACT  AEPB Inhale 1 puff into the lungs daily.  ? meloxicam (MOBIC) 15 MG tablet TAKE 1 TABLET (15 MG TOTAL) BY MOUTH DAILY.  ? tiZANidine (ZANAFLEX) 4 MG tablet TAKE 1 TABLET BY MOUTH ONCE FOR 1 DOSE.  ? traMADol (ULTRAM) 50 MG tablet Take 1 tablet (50 mg total) by mouth every 8 (eight) hours as needed.  ?  ? ?Allergies:   Penicillins and Influenza vaccines  ? ?Social History  ? ?Socioeconomic History  ? Marital status: Divorced  ?  Spouse name: Not on file  ? Number of children: Not on file  ? Years of education: Not on file  ? Highest education level: Not on file  ?Occupational History  ? Not on file  ?Tobacco Use  ? Smoking status: Former  ?  Types: Cigarettes  ?  Quit date: 07/25/1982  ?  Years since quitting: 39.7  ? Smokeless tobacco: Never  ? Tobacco comments:  ?  REMOTE SOCIAL HISTORY  ?Substance and Sexual Activity  ? Alcohol use: No  ? Drug use: No  ? Sexual activity: Not Currently  ?Other Topics Concern  ? Not on file  ?Social History Narrative  ? Not on file  ? ?Social Determinants of Health  ? ?Financial Resource Strain: Not on file  ?Food Insecurity: Not on file  ?Transportation Needs: Not on file  ?Physical Activity: Not  on file  ?Stress: Not on file  ?Social Connections: Not on file  ?  ? ?Family History: ?The patient's family history includes Heart disease in his father and mother; Vascular Disease in his maternal grandfather. There is no history of Neuropathy. ? ?ROS:   ?Please see the history of present illness.    ?All other systems reviewed and are negative. ? ?EKGs/Labs/Other Studies Reviewed:   ? ?The following studies were reviewed today: ?Echo 07-31-21: ? 1. Left ventricular ejection fraction, by estimation, is 60 to 65%. The  ?left ventricle has normal function. The left ventricle has no regional  ?wall motion abnormalities. There is mild left ventricular hypertrophy.  ?Left ventricular diastolic parameters  ?were normal.  ? 2. Right ventricular systolic function is low normal. The right   ?ventricular size is normal.  ? 3. The mitral valve is normal in structure. No evidence of mitral valve  ?regurgitation.  ? 4. The aortic valve is tricuspid. Aortic valve regurgitation is trivial.  ?Mild aortic valve sclerosis is present, with no evidence of aortic valve  ?stenosis.  ? ?Comparison(s): No prior Echocardiogram.  ? ?Stress Echo 2019: ?Impressions:  ? ?- This is intrepreted as a negative stress echo.  ?  There is no evidence of ischemia. Normal LV function  ? ?EKG:  EKG is ordered today.  The ekg ordered today demonstrates normal sinus rhythm 68 bpm, minimal voltage criteria for LVH may be normal variant. ? ?Recent Labs: ?07/15/2021: BUN 17; Creatinine, Ser 0.95; Hemoglobin 13.6; NT-Pro BNP 140; Platelets 217; Potassium 4.2; Sodium 140  ?Recent Lipid Panel ?   ?Component Value Date/Time  ? CHOL 167 03/05/2021 1149  ? TRIG 84 03/05/2021 1149  ? HDL 56 03/05/2021 1149  ? CHOLHDL 3.0 03/05/2021 1149  ? CHOLHDL 3 04/08/2014 1006  ? VLDL 15.8 04/08/2014 1006  ? [place] 95 03/05/2021 1149  ? ? ? ?Risk Assessment/Calculations:   ?  ? ?    ? ?Physical Exam:   ? ?VS:  BP 120/80   Pulse 68   Ht '5\' 10"'$  (1.778 m)   Wt 192 lb (87.1 kg)   SpO2 97%   BMI 27.55 kg/m?    ? ?Wt Readings from Last 3 Encounters:  ?04/12/22 192 lb (87.1 kg)  ?03/01/22 197 lb 3.2 oz (89.4 kg)  ?02/18/22 198 lb 6.4 oz (90 kg)  ?  ? ?GEN:  Well nourished, well developed in no acute distress ?HEENT: Normal ?NECK: No JVD; No carotid bruits ?LYMPHATICS: No lymphadenopathy ?CARDIAC: RRR, no murmurs, rubs, gallops ?RESPIRATORY:  Clear to auscultation without rales, wheezing or rhonchi  ?ABDOMEN: Soft, non-tender, non-distended ?MUSCULOSKELETAL:  No edema; No deformity  ?SKIN: Warm and dry ?NEUROLOGIC:  Alert and oriented x 3 ?PSYCHIATRIC:  Normal affect  ? ?ASSESSMENT:   ? ?1. Deep vein thrombosis (DVT) of lower extremity, unspecified chronicity, unspecified laterality, unspecified vein (HCC)   ?2. Shortness of breath   ?3. History of DVT (deep  vein thrombosis)   ?4. Mixed hyperlipidemia   ? ?PLAN:   ? ?In order of problems listed above: ? ?No new concerns identified.  Tolerating rivaroxaban without problems at a dose of 10 mg daily.  Most recent labs reviewed. ?Stable, likely related to deconditioning.  Cardiac evaluation has been negative in the past. ?As above ?Labs reviewed.  Cholesterol improved from 215-162 with LDL from 153 down to 99, now on atorvastatin 40 mg daily.  I will continue the same. ? ? ?   ?Medication Adjustments/Labs and Tests  Ordered: ?Current medicines are reviewed at length with the patient today.  Concerns regarding medicines are outlined above.  ?Orders Placed This Encounter  ?Procedures  ? EKG 12-Lead  ? ?Meds ordered this encounter  ?Medications  ? rivaroxaban (XARELTO) 10 MG TABS tablet  ?  Sig: Take 1 tablet (10 mg total) by mouth daily.  ?  Dispense:  90 tablet  ?  Refill:  3  ? ? ?Patient Instructions  ?Medication Instructions:  ?REFILL Xarelto ? ?*If you need a refill on your cardiac medications before your next appointment, please call your pharmacy* ? ? ?Lab Work: ?NONE ?If you have labs (blood work) drawn today and your tests are completely normal, you will receive your results only by: ?MyChart Message (if you have MyChart) OR ?A paper copy in the mail ?If you have any lab test that is abnormal or we need to change your treatment, we will call you to review the results. ? ? ?Testing/Procedures: ?NONE ? ? ?Follow-Up: ?At [facility], you and your health needs are our priority.  As part of our continuing mission to provide you with exceptional heart care, we have created designated Provider Care Teams.  These Care Teams include your primary Cardiologist (physician) and Advanced Practice Providers (APPs -  Physician Assistants and Nurse Practitioners) who all work together to provide you with the care you need, when you need it. ? ?Your next appointment:   ?1 year(s) ? ?The format for your next appointment:   ?In  Person ? ?Provider:   ?Sherren Mocha, MD   ? ?  ? ?Important Information About Sugar ? ? ? ? ?   ? ?Signed, ?Sherren Mocha, MD  ?04/12/2022 1:07 PM    ?[place] ?

## 2022-04-12 NOTE — Therapy (Signed)
?OUTPATIENT PHYSICAL THERAPY TREATMENT NOTE ? ? ?Patient Name: Andrew [name] ?MRN: [id number] ?DOB:1951-06-20, 71 y.o., male ?Today's Date: 04/12/2022 ? ?PCP: Bernerd Limbo, MD ?REFERRING PROVIDER: Bernerd Limbo, MD ? ?END OF SESSION:  ? PT End of Session - 04/12/22 1402   ? ? Visit Number 9   ? Number of Visits 12   ? Date for PT Re-Evaluation 05/11/22   ? Authorization Type Medicare;  12 visits per Dr. Wynelle Link   ? PT Start Time 1401   ? PT Stop Time 1440   ? PT Time Calculation (min) 39 min   ? Activity Tolerance Patient tolerated treatment well   ? Behavior During Therapy [facility] for tasks assessed/performed   ? ?  ?  ? ?  ? ? ? ?Past Medical History:  ?Diagnosis Date  ? DVT (deep venous thrombosis) ([place])   ? right leg in 2011; prior injury to right foot  ? Echocardiogram 07/2021  ? Echocardiogram 8/22: EF 60-65, no RWMA, mild LVH, low normal RVSF, trivial AI, mild AV sclerosis w/o AS  ? HLD (hyperlipidemia)   ? Right foot injury   ? ?Past Surgical History:  ?Procedure Laterality Date  ? NO PAST SURGERIES    ? ?Patient Active Problem List  ? Diagnosis Date Noted  ? Acute bronchitis 02/18/2022  ? Loss of transverse plantar arch of right foot 12/17/2021  ? COVID-19 long hauler manifesting chronic cough 10/28/2021  ? History of COVID-19 10/28/2021  ? Effusion of left knee 09/09/2021  ? History of deep vein thrombosis (DVT) of lower extremity 05/13/2021  ? Degenerative cervical disc 01/16/2021  ? Right shoulder pain 01/16/2021  ? Degenerative arthritis of knee 05/28/2020  ? Spondylosis without myelopathy or radiculopathy, lumbar region 10/11/2016  ? Migraines 06/08/2016  ? Nuclear sclerotic cataract of both eyes 06/08/2016  ? Presbyopia 06/08/2016  ? Hypermetropia of both eyes 06/08/2016  ? Long term current use of anticoagulant 06/02/2016  ? Recurrent right inguinal hernia 12/05/2014  ? Chronic venous insufficiency 11/28/2014  ? Bradycardia 10/11/2012  ? Right leg DVT ([place]) 10/03/2012  ? HLD (hyperlipidemia) 07/25/2012   ? ?REFERRING DIAG: G86.761 (ICD-10-CM) - Presence of left artificial knee joint ?  ?THERAPY DIAG:  ?Acute pain of left knee ?  ?Stiffness of left knee, not elsewhere classified ?  ?Muscle weakness (generalized) ?  ?PERTINENT HISTORY: See above ?  ?PRECAUTIONS: none ?  ?SUBJECTIVE: Patient states that he is doing well. Reports Lt knee pain currently at 4-5/10. Brother reports concern over not getting to terminal knee extension.  ? ?PAIN:  ?Are you having pain? No  ?Pain location: left knee  ?Pain description: ?Aggravating factors: static positions ?Relieving factors: medication ?  ?OBJECTIVE:  ?  ?DIAGNOSTIC FINDINGS: OA ?  ?PATIENT SURVEYS:  ?FOTO 44% ?  ?COGNITION: ?          Overall cognitive status: Within functional limits for tasks assessed               ?           ?  ?POSTURE:  ?Flexed knee in supine unable to fully extend ?  ?PALPATION: ?Purple bruising medial left thigh; dressing over surgical incision; moderate edema apparent  ?  ?LE ROM: ?  ?Active ROM Right ?4/4 Left ?4/4 Left  ?03/22/22 Left ?04/05/22  ?Hip flexion         ?Hip extension         ?Hip abduction         ?Hip adduction         ?  Hip internal rotation         ?Hip external rotation         ?Knee flexion 130 54 70 103  ?Knee extension 0 -25     ?Ankle dorsiflexion         ?Ankle plantarflexion         ?Ankle inversion         ?Ankle eversion         ? (Blank rows = not tested) ?  ?LE MMT:  Unable to do SLR ?  ?MMT Right ?4/4 Left ?4/4  ?Hip flexion 5 2  ?Hip extension      ?Hip abduction 5 3  ?Hip adduction      ?Hip internal rotation      ?Hip external rotation      ?Knee flexion 5 2  ?Knee extension 5 2  ?Ankle dorsiflexion      ?Ankle plantarflexion      ?Ankle inversion      ?Ankle eversion      ? (Blank rows = not tested) ?  ?  ?FUNCTIONAL TESTS:  ?Needs assist to lift leg on/off table  ?  ?GAIT: ?Distance walked: 100 ?Assistive device utilized: Environmental consultant - 2 wheeled ?Level of assistance: Modified independence ?Comments:  ?  ?  ?  ?TODAY'S  TREATMENT:  ? 04/12/22 ?NuStep level 2 x 7 min with PT present to discuss pain control and how to manage constipation.  ?Adductor ball squeeze 2x10 ?Seated LAQ 5# 3x10 ?Seated hamstring stretch Lt: 3x20 sec (added footstool) ?Leg press: Single leg 30#, 35#, 40# 10x each Lt LE only ?Standing terminal knee extension with green band, 2 x 10, standing at bar ?Sit to stand with verbal cues for even stance and proper weight shift even bilaterally 2 x 10 - placed Lt foot slightly further back than Rt to encourage Lt weight shift ?Gait training without a.d. x 200 feet.  Verbal cues for increased step length and heel strike as well as proper equal weight shifting.   ?04/08/22 ?NuStep level 2 x 7 min with PTA present to discuss pain control and how to manage constipation.  ?Adductor ball squeeze 2x10 ?Seated LAQ 5# 3x10 ?Seated hamstring stretch Lt: 3x20 sec (added footstool) ?Leg press: Bil 55# 2x10 VC for control and knee extension: second set left only 30# 10x (vc's for striving for terminal extension) ?Stair training encouraging reciprocal gait ascending descending x 3 with lengthy education on eccentric control ?Sit to stand with verbal cues for even stance and proper weight shift even bilaterally 2 x 10 ?Gait training without a.d. x 200 feet.  Verbal cues for increased step length and heel strike as well as proper equal weight shifting.   ?04/05/22" ?NuStep level 2 x 7 min with PTA present to discuss pain control and how to manage constipation.  ?Adductor ball squeeze 2x10 ?6" step ups 10x with UE on rails ?Standing with left foot on second step:  AAROM for flexion of left knee x 20 hold ?Seated AA heel slides x 5 hold 10 sec each ?Seated hamstring stretch Lt: 3x20 sec ?Leg press: Bil 55# 2x10 VC for control and knee extension: second set added LT ankle DF.LTLE only 30# 10x ?Seated LAQ 2# 3x10 ?Supine SAQ x 20 with3 lb ?Game ready 3 snowflakes, medium pressure x 10 min; pt in reclined position unable to tolerate supine with  leg elevated at this time ? ?  ?  ?PATIENT EDUCATION:  ?Education details: Continue pain control and  normalization of gait, using gentle overpressure at home with extension exercises; discussed benefit of thigh high compression for swelling;  ?Person educated: Patient and Caregiver ?Education method: Explanation, Demonstration, and Handouts ?Education comprehension: verbalized understanding and returned demonstration ?  ?  ?HOME EXERCISE PROGRAM: ?Access Code: [email or web address] ?URL: [email or web address] ?Date: 03/16/2022 ?Prepared by: Ruben Im ?  ?Exercises ?- Seated Heel Slide  - 3 x daily - 7 x weekly - 1 sets - 20 reps ?- Hip Flexor Stretch on Step  - 3 x daily - 7 x weekly - 1 sets - 20 reps ?- Backward Weight Shift and Opposite Arm Raise with Walker  - 3 x daily - 7 x weekly - 1 sets - 20 reps ? 04/02/22: Seated Hamstring Stretch  - 3 x daily - 7 x weekly - 5 reps - 10 hold ?ASSESSMENT: ?  ?CLINICAL IMPRESSION: ?Pt continuing to make appropriate progress. Terminal knee extension with motor control improving. He tolerated addition of closed kinetic chain terminal knee extension well demonstrated by good control and appropriate challenge. He did well with increased weight to single leg leg press with excellent control; pt attempted over pressure with each repetition to help improve full extension of Lt knee. Currently measuring -12 degrees of terminal Lt knee extension in supine. He will continue to benefit from skilled PT intervention in order to progress terminal knee extension and functional strengthening.  ?   ? ?OBJECTIVE IMPAIRMENTS decreased activity tolerance, decreased mobility, difficulty walking, decreased ROM, decreased strength, increased edema, impaired perceived functional ability, and pain.  ?  ?ACTIVITY LIMITATIONS community activity and driving.  ?  ?PERSONAL FACTORS Language barrier and other affected body regions  affect patient's functional outcome.  ?  ?  ?REHAB POTENTIAL: Good ?   ?CLINICAL DECISION MAKING: Stable/uncomplicated ?  ?EVALUATION COMPLEXITY: Low ?  ?  ?GOALS: ?Goals reviewed with patient? Yes ?  ?SHORT TERM GOALS: Target date: 04/13/2022 ?  ?The patient will demonstra

## 2022-04-12 NOTE — Patient Instructions (Signed)
Medication Instructions:  ?REFILL Xarelto ? ?*If you need a refill on your cardiac medications before your next appointment, please call your pharmacy* ? ? ?Lab Work: ?NONE ?If you have labs (blood work) drawn today and your tests are completely normal, you will receive your results only by: ?MyChart Message (if you have MyChart) OR ?A paper copy in the mail ?If you have any lab test that is abnormal or we need to change your treatment, we will call you to review the results. ? ? ?Testing/Procedures: ?NONE ? ? ?Follow-Up: ?At [facility], you and your health needs are our priority.  As part of our continuing mission to provide you with exceptional heart care, we have created designated Provider Care Teams.  These Care Teams include your primary Cardiologist (physician) and Advanced Practice Providers (APPs -  Physician Assistants and Nurse Practitioners) who all work together to provide you with the care you need, when you need it. ? ?Your next appointment:   ?1 year(s) ? ?The format for your next appointment:   ?In Person ? ?Provider:   ?Sherren Mocha, MD   ? ?  ? ?Important Information About Sugar ? ? ? ? ?  ?

## 2022-04-15 ENCOUNTER — Ambulatory Visit: Payer: Medicare Other

## 2022-04-15 DIAGNOSIS — M25662 Stiffness of left knee, not elsewhere classified: Secondary | ICD-10-CM

## 2022-04-15 DIAGNOSIS — M25562 Pain in left knee: Secondary | ICD-10-CM | POA: Diagnosis not present

## 2022-04-15 DIAGNOSIS — R2689 Other abnormalities of gait and mobility: Secondary | ICD-10-CM

## 2022-04-15 DIAGNOSIS — M6281 Muscle weakness (generalized): Secondary | ICD-10-CM

## 2022-04-15 DIAGNOSIS — R262 Difficulty in walking, not elsewhere classified: Secondary | ICD-10-CM

## 2022-04-15 NOTE — Therapy (Signed)
?OUTPATIENT PHYSICAL THERAPY TREATMENT NOTE ? ? ?Patient Name: Andrew [name] ?MRN: [id number] ?DOB:1951-11-20, 71 y.o., male ?Today's Date: 04/15/2022 ? ?PCP: Bernerd Limbo, MD ?REFERRING PROVIDER: Gaynelle Arabian, MD ? ?END OF SESSION:  ? PT End of Session - 04/15/22 1023   ? ? Visit Number 10   ? Number of Visits 12   ? Date for PT Re-Evaluation 05/11/22   ? Authorization Type Medicare;  12 visits per Dr. Wynelle Link   ? PT Start Time 1016   ? PT Stop Time 1100   ? PT Time Calculation (min) 44 min   ? Activity Tolerance Patient tolerated treatment well   ? Behavior During Therapy [facility] for tasks assessed/performed   ? ?  ?  ? ?  ? ? ? ?Past Medical History:  ?Diagnosis Date  ? DVT (deep venous thrombosis) ([place])   ? right leg in 2011; prior injury to right foot  ? Echocardiogram 07/2021  ? Echocardiogram 8/22: EF 60-65, no RWMA, mild LVH, low normal RVSF, trivial AI, mild AV sclerosis w/o AS  ? HLD (hyperlipidemia)   ? Right foot injury   ? ?Past Surgical History:  ?Procedure Laterality Date  ? NO PAST SURGERIES    ? ?Patient Active Problem List  ? Diagnosis Date Noted  ? Acute bronchitis 02/18/2022  ? Loss of transverse plantar arch of right foot 12/17/2021  ? COVID-19 long hauler manifesting chronic cough 10/28/2021  ? History of COVID-19 10/28/2021  ? Effusion of left knee 09/09/2021  ? History of deep vein thrombosis (DVT) of lower extremity 05/13/2021  ? Degenerative cervical disc 01/16/2021  ? Right shoulder pain 01/16/2021  ? Degenerative arthritis of knee 05/28/2020  ? Spondylosis without myelopathy or radiculopathy, lumbar region 10/11/2016  ? Migraines 06/08/2016  ? Nuclear sclerotic cataract of both eyes 06/08/2016  ? Presbyopia 06/08/2016  ? Hypermetropia of both eyes 06/08/2016  ? Long term current use of anticoagulant 06/02/2016  ? Recurrent right inguinal hernia 12/05/2014  ? Chronic venous insufficiency 11/28/2014  ? Bradycardia 10/11/2012  ? Right leg DVT ([place]) 10/03/2012  ? HLD (hyperlipidemia) 07/25/2012   ? ?REFERRING DIAG: C58.527 (ICD-10-CM) - Presence of left artificial knee joint ?  ?THERAPY DIAG:  ?Acute pain of left knee ?  ?Stiffness of left knee, not elsewhere classified ?  ?Muscle weakness (generalized) ?  ?PERTINENT HISTORY: See above ?  ?PRECAUTIONS: none ?  ?SUBJECTIVE: Patient states that he had f/u with Dr. Wynelle Link and he is pleased with progress. No pain.   ? ?PAIN:  ?Are you having pain? No  ?Pain location: left knee  ?Pain description: ?Aggravating factors: static positions ?Relieving factors: medication ?  ?OBJECTIVE:  ?  ?DIAGNOSTIC FINDINGS: OA ?  ?PATIENT SURVEYS:  ?FOTO 44% ?  ?COGNITION: ?          Overall cognitive status: Within functional limits for tasks assessed               ?           ?  ?POSTURE:  ?Flexed knee in supine unable to fully extend ?  ?PALPATION: ?Purple bruising medial left thigh; dressing over surgical incision; moderate edema apparent  ?  ?LE ROM: ?  ?Active ROM Right ?4/4 Left ?4/4 Left  ?03/22/22 Left ?04/05/22  ?Hip flexion         ?Hip extension         ?Hip abduction         ?Hip adduction         ?  Hip internal rotation         ?Hip external rotation         ?Knee flexion 130 54 70 103  ?Knee extension 0 -25     ?Ankle dorsiflexion         ?Ankle plantarflexion         ?Ankle inversion         ?Ankle eversion         ? (Blank rows = not tested) ?  ?LE MMT:  Unable to do SLR ?  ?MMT Right ?4/4 Left ?4/4  ?Hip flexion 5 2  ?Hip extension      ?Hip abduction 5 3  ?Hip adduction      ?Hip internal rotation      ?Hip external rotation      ?Knee flexion 5 2  ?Knee extension 5 2  ?Ankle dorsiflexion      ?Ankle plantarflexion      ?Ankle inversion      ?Ankle eversion      ? (Blank rows = not tested) ?  ?  ?FUNCTIONAL TESTS:  ?Needs assist to lift leg on/off table  ?  ?GAIT: ?Distance walked: 100 ?Assistive device utilized: Environmental consultant - 2 wheeled ?Level of assistance: Modified independence ?Comments:  ?  ?  ?  ?TODAY'S TREATMENT:  ? 04/15/22 ?NuStep level 2 x 5 min with PT  present to discuss progress.  ?Leg press: Bilateral to work toward equal extension 80# x 20, Single leg  40# 2 x 10 (left) ?Rocker board x 20 at bar ?TKE with green band x 20 ?Standing w/o UE support cone tap up with right foot balancing on left x 20 ?Standing hamstring stretch 3 x 30 sec ?Lateral band walks (blue loop) x 3 laps at bar ?Single leg stance cone touch ?Step up and hold on balance pad (step up with left , right hip flexion pause and hold) ?Seated LAQ x 10 no weight due to patient experiencing "cramping" ?Manual: patellar mobs Sup/inf and med/lat, incision mobilization and soft tissue mobilization x 8 min    ?04/12/22 ?NuStep level 2 x 7 min with PT present to discuss pain control and how to manage constipation.  ?Adductor ball squeeze 2x10 ?Seated LAQ 5# 3x10 ?Seated hamstring stretch Lt: 3x20 sec (added footstool) ?Leg press: Single leg 30#, 35#, 40# 10x each Lt LE only ?Standing terminal knee extension with green band, 2 x 10, standing at bar ?Sit to stand with verbal cues for even stance and proper weight shift even bilaterally 2 x 10 - placed Lt foot slightly further back than Rt to encourage Lt weight shift ?Gait training without a.d. x 200 feet.  Verbal cues for increased step length and heel strike as well as proper equal weight shifting.   ?04/08/22 ?NuStep level 2 x 7 min with PTA present to discuss pain control and how to manage constipation.  ?Adductor ball squeeze 2x10 ?Seated LAQ 5# 3x10 ?Seated hamstring stretch Lt: 3x20 sec (added footstool) ?Leg press: Bil 55# 2x10 VC for control and knee extension: second set left only 30# 10x (vc's for striving for terminal extension) ?Stair training encouraging reciprocal gait ascending descending x 3 with lengthy education on eccentric control ?Sit to stand with verbal cues for even stance and proper weight shift even bilaterally 2 x 10 ?Gait training without a.d. x 200 feet.  Verbal cues for increased step length and heel strike as well as proper equal  weight shifting.   ?  ?  ?  PATIENT EDUCATION:  ?Education details: Continue pain control and normalization of gait, using gentle overpressure at home with extension exercises; discussed benefit of thigh high compression for swelling;  ?Person educated: Patient and Caregiver ?Education method: Explanation, Demonstration, and Handouts ?Education comprehension: verbalized understanding and returned demonstration ?  ?  ?HOME EXERCISE PROGRAM: ?Access Code: [email or web address] ?URL: [email or web address] ?Date: 03/16/2022 ?Prepared by: Ruben Im ?  ?Exercises ?- Seated Heel Slide  - 3 x daily - 7 x weekly - 1 sets - 20 reps ?- Hip Flexor Stretch on Step  - 3 x daily - 7 x weekly - 1 sets - 20 reps ?- Backward Weight Shift and Opposite Arm Raise with Walker  - 3 x daily - 7 x weekly - 1 sets - 20 reps ? 04/02/22: Seated Hamstring Stretch  - 3 x daily - 7 x weekly - 5 reps - 10 hold ?ASSESSMENT: ?  ?CLINICAL IMPRESSION: ?Pt is progressing appropriately.  He appears to get some patellofemoral "cramping" which is likely myofascial restriction.  His patellar mobility is much improved.  He is very compliant and well motivated. He has 2 visits left and will then transition to his local fitness facility.   ?   ? ?OBJECTIVE IMPAIRMENTS decreased activity tolerance, decreased mobility, difficulty walking, decreased ROM, decreased strength, increased edema, impaired perceived functional ability, and pain.  ?  ?ACTIVITY LIMITATIONS community activity and driving.  ?  ?PERSONAL FACTORS Language barrier and other affected body regions  affect patient's functional outcome.  ?  ?  ?REHAB POTENTIAL: Good ?  ?CLINICAL DECISION MAKING: Stable/uncomplicated ?  ?EVALUATION COMPLEXITY: Low ?  ?  ?GOALS: ?Goals reviewed with patient? Yes ?  ?SHORT TERM GOALS: Target date: 04/13/2022 ?  ?The patient will demonstrate knowledge of basic self care strategies and exercises to promote healing   ?Baseline: ?Goal status: INITIAL ?  ?2.  The patient  will have improved knee ROM 10-90 degrees for improved ability to get in/out of the car ?Baseline:  ?Goal status: INITIAL ?  ?3.  The patient will be able to walk household distances with a cane ?Baseline

## 2022-04-19 ENCOUNTER — Encounter: Payer: Medicare Other | Admitting: Physical Therapy

## 2022-04-19 ENCOUNTER — Other Ambulatory Visit: Payer: Self-pay | Admitting: Podiatry

## 2022-04-20 ENCOUNTER — Ambulatory Visit: Payer: Medicare Other

## 2022-04-20 ENCOUNTER — Telehealth: Payer: Self-pay

## 2022-04-20 DIAGNOSIS — R262 Difficulty in walking, not elsewhere classified: Secondary | ICD-10-CM

## 2022-04-20 DIAGNOSIS — M25662 Stiffness of left knee, not elsewhere classified: Secondary | ICD-10-CM

## 2022-04-20 DIAGNOSIS — R252 Cramp and spasm: Secondary | ICD-10-CM

## 2022-04-20 DIAGNOSIS — M6281 Muscle weakness (generalized): Secondary | ICD-10-CM

## 2022-04-20 DIAGNOSIS — M25562 Pain in left knee: Secondary | ICD-10-CM | POA: Diagnosis not present

## 2022-04-20 NOTE — Therapy (Signed)
?OUTPATIENT PHYSICAL THERAPY TREATMENT NOTE ? ? ?Patient Name: Andrew [name] ?MRN: [id number] ?DOB:Dec 06, 1951, 71 y.o., male ?Today's Date: 04/20/2022 ? ?PCP: Bernerd Limbo, MD ?REFERRING PROVIDER: Bernerd Limbo, MD ? ?END OF SESSION:  ? PT End of Session - 04/20/22 1694   ? ? Visit Number 11   ? Date for PT Re-Evaluation 05/11/22   ? Authorization Type Medicare;  12 visits per Dr. Wynelle Link   ? PT Start Time 0930   ? PT Stop Time 1030   ? PT Time Calculation (min) 60 min   ? Activity Tolerance Patient tolerated treatment well   ? Behavior During Therapy [facility] for tasks assessed/performed   ? ?  ?  ? ?  ? ? ? ?Past Medical History:  ?Diagnosis Date  ? DVT (deep venous thrombosis) ([place])   ? right leg in 2011; prior injury to right foot  ? Echocardiogram 07/2021  ? Echocardiogram 8/22: EF 60-65, no RWMA, mild LVH, low normal RVSF, trivial AI, mild AV sclerosis w/o AS  ? HLD (hyperlipidemia)   ? Right foot injury   ? ?Past Surgical History:  ?Procedure Laterality Date  ? NO PAST SURGERIES    ? ?Patient Active Problem List  ? Diagnosis Date Noted  ? Acute bronchitis 02/18/2022  ? Loss of transverse plantar arch of right foot 12/17/2021  ? COVID-19 long hauler manifesting chronic cough 10/28/2021  ? History of COVID-19 10/28/2021  ? Effusion of left knee 09/09/2021  ? History of deep vein thrombosis (DVT) of lower extremity 05/13/2021  ? Degenerative cervical disc 01/16/2021  ? Right shoulder pain 01/16/2021  ? Degenerative arthritis of knee 05/28/2020  ? Spondylosis without myelopathy or radiculopathy, lumbar region 10/11/2016  ? Migraines 06/08/2016  ? Nuclear sclerotic cataract of both eyes 06/08/2016  ? Presbyopia 06/08/2016  ? Hypermetropia of both eyes 06/08/2016  ? Long term current use of anticoagulant 06/02/2016  ? Recurrent right inguinal hernia 12/05/2014  ? Chronic venous insufficiency 11/28/2014  ? Bradycardia 10/11/2012  ? Right leg DVT ([place]) 10/03/2012  ? HLD (hyperlipidemia) 07/25/2012  ? ?REFERRING DIAG:  H03.888 (ICD-10-CM) - Presence of left artificial knee joint ?  ?THERAPY DIAG:  ?Acute pain of left knee ?  ?Stiffness of left knee, not elsewhere classified ?  ?Muscle weakness (generalized) ?  ?PERTINENT HISTORY: See above ?  ?PRECAUTIONS: none ?  ?SUBJECTIVE: Patient states that he had a lot of pain anterior knee after last visit and just today able to walk without the walker or cane.  He arrives with brother and is limping but without a.d.     ? ?PAIN:  ?Are you having pain? No  ?Pain location: left knee  ?Pain description: ?Aggravating factors: static positions ?Relieving factors: medication ?  ?OBJECTIVE:  ?  ?DIAGNOSTIC FINDINGS: OA ?  ?PATIENT SURVEYS:  ?FOTO 44% ?  ?COGNITION: ?          Overall cognitive status: Within functional limits for tasks assessed               ?           ?  ?POSTURE:  ?Flexed knee in supine unable to fully extend ?  ?PALPATION: ?Purple bruising medial left thigh; dressing over surgical incision; moderate edema apparent  ?  ?LE ROM: ?  ?Active ROM Right ?4/4 Left ?4/4 Left  ?03/22/22 Left ?04/05/22  ?Hip flexion         ?Hip extension         ?Hip abduction         ?  Hip adduction         ?Hip internal rotation         ?Hip external rotation         ?Knee flexion 130 54 70 103  ?Knee extension 0 -25     ?Ankle dorsiflexion         ?Ankle plantarflexion         ?Ankle inversion         ?Ankle eversion         ? (Blank rows = not tested) ?  ?LE MMT:  Unable to do SLR ?  ?MMT Right ?4/4 Left ?4/4  ?Hip flexion 5 2  ?Hip extension      ?Hip abduction 5 3  ?Hip adduction      ?Hip internal rotation      ?Hip external rotation      ?Knee flexion 5 2  ?Knee extension 5 2  ?Ankle dorsiflexion      ?Ankle plantarflexion      ?Ankle inversion      ?Ankle eversion      ? (Blank rows = not tested) ?  ?  ?FUNCTIONAL TESTS:  ?Needs assist to lift leg on/off table  ?  ?GAIT: ?Distance walked: 100 ?Assistive device utilized: Environmental consultant - 2 wheeled ?Level of assistance: Modified independence ?Comments:   ?  ?  ?  ?TODAY'S TREATMENT:  ? ? 04/20/22 ?NuStep level 2 x 5 min with PT present to discuss progress.  ?Re-assessed for complaints of anterior knee pain: palpated on seated flexion/extension ?Manual: patellar mobs Sup/inf and med/lat, incision mobilization and soft tissue mobilization x 8 min ?Quad set x 20 ?TKE with white foam roller x 20 ?SAQ with blue bolster x 20 ?Lengthy discussion about possible reasons for pain onset after last session and importance of extension propping: also explained that PT will call MD and explain symptoms to see if there is concern or need for follow up.   ? ?04/15/22 ?NuStep level 2 x 5 min with PT present to discuss progress.  ?Leg press: Bilateral to work toward equal extension 80# x 20, Single leg  40# 2 x 10 (left) ?Rocker board x 20 at bar ?TKE with green band x 20 ?Standing w/o UE support cone tap up with right foot balancing on left x 20 ?Standing hamstring stretch 3 x 30 sec ?Lateral band walks (blue loop) x 3 laps at bar ?Single leg stance cone touch ?Step up and hold on balance pad (step up with left , right hip flexion pause and hold) ?Seated LAQ x 10 no weight due to patient experiencing "cramping" ?Manual: patellar mobs Sup/inf and med/lat, incision mobilization and soft tissue mobilization x 8 min    ?04/12/22 ?NuStep level 2 x 7 min with PT present to discuss pain control and how to manage constipation.  ?Adductor ball squeeze 2x10 ?Seated LAQ 5# 3x10 ?Seated hamstring stretch Lt: 3x20 sec (added footstool) ?Leg press: Single leg 30#, 35#, 40# 10x each Lt LE only ?Standing terminal knee extension with green band, 2 x 10, standing at bar ?Sit to stand with verbal cues for even stance and proper weight shift even bilaterally 2 x 10 - placed Lt foot slightly further back than Rt to encourage Lt weight shift ?Gait training without a.d. x 200 feet.  Verbal cues for increased step length and heel strike as well as proper equal weight shifting.   ?   ?  ?  ?PATIENT EDUCATION:   ?Education details: Continue  pain control and normalization of gait, using gentle overpressure at home with extension exercises; discussed benefit of thigh high compression for swelling;  ?Person educated: Patient and Caregiver ?Education method: Explanation, Demonstration, and Handouts ?Education comprehension: verbalized understanding and returned demonstration ?  ?  ?HOME EXERCISE PROGRAM: ?Access Code: [email or web address] ?URL: [email or web address] ?Date: 03/16/2022 ?Prepared by: Ruben Im ?  ?Exercises ?- Seated Heel Slide  - 3 x daily - 7 x weekly - 1 sets - 20 reps ?- Hip Flexor Stretch on Step  - 3 x daily - 7 x weekly - 1 sets - 20 reps ?- Backward Weight Shift and Opposite Arm Raise with Walker  - 3 x daily - 7 x weekly - 1 sets - 20 reps ? 04/02/22: Seated Hamstring Stretch  - 3 x daily - 7 x weekly - 5 reps - 10 hold ?ASSESSMENT: ?  ?CLINICAL IMPRESSION: ?Pt is still having some pain after last session at anterior knee but is back to walking without walker or cane.  He has observable slightly antalgic gait with decreased stance time, heel strike and knee extension during stance on involved side.  He does have some palpable crepitus on seated flexion and extension and this is painful for him but he also has struggled with gaining extension.  Call placed to referring MD to explain symptoms.  Patient and brother will follow up with MD.  ?   ? ?OBJECTIVE IMPAIRMENTS decreased activity tolerance, decreased mobility, difficulty walking, decreased ROM, decreased strength, increased edema, impaired perceived functional ability, and pain.  ?  ?ACTIVITY LIMITATIONS community activity and driving.  ?  ?PERSONAL FACTORS Language barrier and other affected body regions  affect patient's functional outcome.  ?  ?  ?REHAB POTENTIAL: Good ?  ?CLINICAL DECISION MAKING: Stable/uncomplicated ?  ?EVALUATION COMPLEXITY: Low ?  ?  ?GOALS: ?Goals reviewed with patient? Yes ?  ?SHORT TERM GOALS: Target date: 04/13/2022 ?   ?The patient will demonstrate knowledge of basic self care strategies and exercises to promote healing   ?Baseline: ?Goal status: MET ?  ?2.  The patient will have improved knee ROM 10-90 degrees for improved

## 2022-04-20 NOTE — Telephone Encounter (Signed)
Call placed to Dr. Anne Fu office to inform of patients new onset of pain and palpable clicking with this pain since last session.  Patient is quite concerned despite reassurance from family.  Requested follow up with MD or possible telehealth visit to address patients concerns. Left message on triage voice mail at 11:45 am on 04-20-22.  PT then placed call to patient's brother to let him know that we had made contact with MD.   ?

## 2022-04-26 ENCOUNTER — Encounter: Payer: Medicare Other | Admitting: Physical Therapy

## 2022-04-27 ENCOUNTER — Ambulatory Visit: Payer: Medicare Other | Admitting: Pulmonary Disease

## 2022-04-28 ENCOUNTER — Ambulatory Visit: Payer: Medicare Other

## 2022-04-28 ENCOUNTER — Other Ambulatory Visit: Payer: Self-pay | Admitting: Surgery

## 2022-05-06 ENCOUNTER — Ambulatory Visit: Payer: Medicare Other

## 2022-05-22 ENCOUNTER — Other Ambulatory Visit: Payer: Self-pay | Admitting: Podiatry

## 2022-05-22 ENCOUNTER — Other Ambulatory Visit: Payer: Self-pay | Admitting: Specialist

## 2022-05-24 ENCOUNTER — Ambulatory Visit: Payer: Medicare Other | Attending: Orthopedic Surgery | Admitting: Physical Therapy

## 2022-05-24 DIAGNOSIS — R262 Difficulty in walking, not elsewhere classified: Secondary | ICD-10-CM | POA: Diagnosis present

## 2022-05-24 DIAGNOSIS — M25562 Pain in left knee: Secondary | ICD-10-CM | POA: Diagnosis present

## 2022-05-24 DIAGNOSIS — M6281 Muscle weakness (generalized): Secondary | ICD-10-CM | POA: Insufficient documentation

## 2022-05-24 NOTE — Therapy (Signed)
OUTPATIENT PHYSICAL THERAPY TREATMENT NOTE   Patient Name: Andrew [name] MRN: [id number] IEP:[birth year], 71 y.o., male Today's Date: 05/24/2022  PCP: Bernerd Limbo, MD REFERRING PROVIDER: Gaynelle Arabian, MD  END OF SESSION:   PT End of Session - 05/24/22 1624     Visit Number 12    Date for PT Re-Evaluation 08/24/22    Authorization Type Medicare;  12 visits per Dr. Wynelle Link    PT Start Time 1539    PT Stop Time 1618    PT Time Calculation (min) 39 min    Activity Tolerance Patient tolerated treatment well;No increased pain    Behavior During Therapy [facility] for tasks assessed/performed               Past Medical History:  Diagnosis Date   DVT (deep venous thrombosis) ([facility])    right leg in 2011; prior injury to right foot   Echocardiogram 07/2021   Echocardiogram 8/22: EF 60-65, no RWMA, mild LVH, low normal RVSF, trivial AI, mild AV sclerosis w/o AS   HLD (hyperlipidemia)    Right foot injury    Past Surgical History:  Procedure Laterality Date   NO PAST SURGERIES     Patient Active Problem List   Diagnosis Date Noted   Acute bronchitis 02/18/2022   Loss of transverse plantar arch of right foot 12/17/2021   COVID-19 long hauler manifesting chronic cough 10/28/2021   History of COVID-19 10/28/2021   Effusion of left knee 09/09/2021   History of deep vein thrombosis (DVT) of lower extremity 05/13/2021   Degenerative cervical disc 01/16/2021   Right shoulder pain 01/16/2021   Degenerative arthritis of knee 05/28/2020   Spondylosis without myelopathy or radiculopathy, lumbar region 10/11/2016   Migraines 06/08/2016   Nuclear sclerotic cataract of both eyes 06/08/2016   Presbyopia 06/08/2016   Hypermetropia of both eyes 06/08/2016   Long term current use of anticoagulant 06/02/2016   Recurrent right inguinal hernia 12/05/2014   Chronic venous insufficiency 11/28/2014   Bradycardia 10/11/2012   Right leg DVT ([place]) 10/03/2012   HLD (hyperlipidemia) 07/25/2012    REFERRING DIAG: C16.606 (ICD-10-CM) - Presence of left artificial knee joint   THERAPY DIAG:  Acute pain of left knee   Stiffness of left knee, not elsewhere classified   Muscle weakness (generalized)   PERTINENT HISTORY: See above   PRECAUTIONS: none   SUBJECTIVE: Pt still reports pain in Lt knee constantly and worst at night and with steps. Pt reports pain started since last visit, saw MD and reports a piece of bone detached from medial patella. Pt reports he is still walking up to a mile without cane but has a lot of pain afterward, does not like ice so he has not been doing that.   PAIN:  Are you having pain? YES Pain location: left knee  Pain description: 8-9/10 Aggravating factors: night/sleeping, stairs Relieving factors: medication, rest   OBJECTIVE:    DIAGNOSTIC FINDINGS: OA   PATIENT SURVEYS:  FOTO 44% 05/24/2022: FOTO44%    COGNITION:           Overall cognitive status: Within functional limits for tasks assessed                            POSTURE:  Flexed knee in supine unable to fully extend   PALPATION: Purple bruising medial left thigh; dressing over surgical incision; moderate edema apparent    LE ROM:   Active ROM Right 4/4  Left 4/4 Left  03/22/22 Left 04/05/22 LEFT 05/24/2022   Hip flexion          Hip extension          Hip abduction          Hip adduction          Hip internal rotation          Hip external rotation          Knee flexion 130 54 70 103 112  Knee extension 0 -25    -9  Ankle dorsiflexion          Ankle plantarflexion          Ankle inversion          Ankle eversion           (Blank rows = not tested)   LE MMT:     MMT Right 4/4 Left 4/4 Left 05/24/2022   Hip flexion 5 2 4/5  Hip extension       Hip abduction 5 3 4/5  Hip adduction       Hip internal rotation       Hip external rotation       Knee flexion 5 2 4/5  Knee extension 5 2 4/5  Ankle dorsiflexion       Ankle plantarflexion       Ankle inversion        Ankle eversion        (Blank rows = not tested)     FUNCTIONAL TESTS:  Needs assist to lift leg on/off table  05/24/2022 I with ADLs and mobility but does have pain   GAIT: Distance walked: 300' Assistive device utilized: no AD Level of assistance: Modified independence Comments: noted left knee decreased extension with stance phase, Rt lateral trunk lean with Lt leg limb advancement.        TODAY'S TREATMENT:   05/24/2022:  PT competed reassessment, re-addressed all goals, assessed MMT, ROM, and palpation of Lt knee. Swelling noted at medial Lt knee more so at distal portion of knee joint, did have pain with palpation at this region and anteriorly. Pt had good mobility of patella with gentle mobility completed in ant/post and Lt/Rt directions however pt reported some pain with this.  HEP addressed as well as pt indicated how he was completing hip flexor stretch on step with forward holding for up to a minute and pain worse after this, per HEP he is to do shorter holds focusing more on dynamic mobility of joint for 20 reps and pt reported this felt more comfortable and agreeable to do this.    04/20/22 NuStep level 2 x 5 min with PT present to discuss progress.  Re-assessed for complaints of anterior knee pain: palpated on seated flexion/extension Manual: patellar mobs Sup/inf and med/lat, incision mobilization and soft tissue mobilization x 8 min Quad set x 20 TKE with white foam roller x 20 SAQ with blue bolster x 20 Lengthy discussion about possible reasons for pain onset after last session and importance of extension propping: also explained that PT will call MD and explain symptoms to see if there is concern or need for follow up.    04/15/22 NuStep level 2 x 5 min with PT present to discuss progress.  Leg press: Bilateral to work toward equal extension 80# x 20, Single leg  40# 2 x 10 (left) Rocker board x 20 at bar TKE with green band x 20 Standing w/o UE  support cone tap up  with right foot balancing on left x 20 Standing hamstring stretch 3 x 30 sec Lateral band walks (blue loop) x 3 laps at bar Single leg stance cone touch Step up and hold on balance pad (step up with left , right hip flexion pause and hold) Seated LAQ x 10 no weight due to patient experiencing "cramping" Manual: patellar mobs Sup/inf and med/lat, incision mobilization and soft tissue mobilization x 8 min        PATIENT EDUCATION:  Education details: Continue pain control and normalization of gait,  Person educated: Patient and Caregiver Education method: Explanation, Demonstration, and Handouts Education comprehension: verbalized understanding and returned demonstration     HOME EXERCISE PROGRAM: Access Code: [email or web address] URL: [email or web address] Date: 03/16/2022 Prepared by: Ruben Im   Exercises - Seated Heel Slide  - 3 x daily - 7 x weekly - 1 sets - 20 reps - Hip Flexor Stretch on Step  - 3 x daily - 7 x weekly - 1 sets - 20 reps - Backward Weight Shift and Opposite Arm Raise with Walker  - 3 x daily - 7 x weekly - 1 sets - 20 reps  04/02/22: Seated Hamstring Stretch  - 3 x daily - 7 x weekly - 5 reps - 10 hold ASSESSMENT:   CLINICAL IMPRESSION: Pt  continues to have anterior knee pain, limited with stairs but reports he is still have to complete all ADLs, walking up to a mile without AD, and does HEP. Pt presents for reassessment and re certification after meeting with MD and requesting aquatic therapy for improved pain control, improved mobility with added benefit of buoyancy to decrease stress at joint. Patient and brother present throughout session, denied additional questions and agreeable to attempt aquatic session then follow up back up with MD. Pt also educated to reach back out to MD if symptoms do not change or get worse.      OBJECTIVE IMPAIRMENTS decreased activity tolerance, decreased mobility, difficulty walking, decreased ROM, decreased strength,  increased edema, impaired perceived functional ability, and pain.    ACTIVITY LIMITATIONS community activity and driving.    PERSONAL FACTORS Language barrier and other affected body regions  affect patient's functional outcome.      REHAB POTENTIAL: Good   CLINICAL DECISION MAKING: Stable/uncomplicated   EVALUATION COMPLEXITY: Low     GOALS: Goals reviewed with patient? Yes   SHORT TERM GOALS: Target date: 04/13/2022   The patient will demonstrate knowledge of basic self care strategies and exercises to promote healing   Baseline: Goal status: MET   2.  The patient will have improved knee ROM 10-90 degrees for improved ability to get in/out of the car Baseline:  Goal status: MET   3.  The patient will be able to walk household distances with a cane Baseline:  Goal status: MET   4.  The patient will have motor control/strength to maneuver LE in/out of bed without UE assist  Baseline:  Goal status: MET     LONG TERM GOALS: Target date: 05/11/2022   The patient will be independent in a safe self progression of a home exercise program to promote further recovery of function   Baseline:  Goal status: MET 05/24/2022    2.  The patient will have improved knee ROM 6-120 degrees needed to ascend/descend steps at home Baseline:  Goal status: NOT MET   3.  The patient will have improved gait stamina and speed needed to ambulate >300 feet  with cane Baseline:  Goal status: MET    4.  The patient will have improved LE strength to at least 4/5 needed for standing to cook and future return to fly fishing Baseline:  Goal status: MET   5.  FOTO score improved to 77% indicating improved function with less pain Baseline:  Goal status: NOT MET  6. Pt to walk 500' with to  without cane with minimal limping or compensations in gait mechanics for improved gait pattern.   Baseline:  Goal status: NEW   PLAN: PT FREQUENCY: 2x/week   PT DURATION: 8 weeks up to 12 visits   PLANNED  INTERVENTIONS: Therapeutic exercises, Therapeutic activity, Neuromuscular re-education, Balance training, Gait training, Patient/Family education, Joint mobilization, Stair training, Aquatic Therapy, Dry Needling, Electrical stimulation, Cryotherapy, Taping, Vasopneumatic device, Ionotophoresis 31m/ml Dexamethasone, and Manual therapy   PLAN FOR NEXT SESSION: aquatics for mobility and decreased pain  HStacy Gardner PT, DPT 06/12/234:35 PM

## 2022-05-24 NOTE — Telephone Encounter (Signed)
Please advise 

## 2022-05-26 ENCOUNTER — Encounter (HOSPITAL_BASED_OUTPATIENT_CLINIC_OR_DEPARTMENT_OTHER): Payer: Self-pay | Admitting: Physical Therapy

## 2022-05-26 ENCOUNTER — Ambulatory Visit (HOSPITAL_BASED_OUTPATIENT_CLINIC_OR_DEPARTMENT_OTHER): Payer: Medicare Other | Attending: Family Medicine | Admitting: Physical Therapy

## 2022-05-26 DIAGNOSIS — M25662 Stiffness of left knee, not elsewhere classified: Secondary | ICD-10-CM | POA: Diagnosis present

## 2022-05-26 DIAGNOSIS — M6281 Muscle weakness (generalized): Secondary | ICD-10-CM | POA: Insufficient documentation

## 2022-05-26 DIAGNOSIS — R262 Difficulty in walking, not elsewhere classified: Secondary | ICD-10-CM | POA: Diagnosis present

## 2022-05-26 DIAGNOSIS — M25562 Pain in left knee: Secondary | ICD-10-CM | POA: Insufficient documentation

## 2022-05-26 NOTE — Progress Notes (Signed)
Andrew Andrew [name] Phone: [phone] Subjective:   Andrew Andrew [name], am serving as a scribe for Andrew Andrew [name].   I'm seeing this patient by the request  of:  Andrew Limbo, MD  CC: Bilateral knee pain left greater than right  [id number]  12/17/2021 Discussed with patient and patient does have breakdown of the transverse arch noted.  Discussed icing regimen and home exercises.  Discussed which activities to do which wants to avoid.  Discussed with patient about proper shoe wear.  If patient has worsening pain will consider the possibility of injections.  Follow-up again in 6 to 8 weeks  Encourage patient at this time to consider the possibility of a knee replacement.  Patient is already met with his surgeon and will consider it in the near future.  Follow-up with me again as needed  Updated 7/36/6815 Andrew Andrew [name] is a 71 y.o. male coming in with complaint of B knee pain. March 28th, 2023 had TKR. Five weeks later patient had sharp pain. Saw Andrew Andrew [name] and he said patient had piece of patella that broke off medially. Pain worse with stairs and inclines.  Denies any fevers, chills, any abnormal weight loss.  R knee pain persists due to compensating from L knee pain. Braces for pain relief. Doing aquatic physical therapy at [facility].  Patient is finding it difficult to do this secondary to the amount of pain.       Past Medical History:  Diagnosis Date   DVT (deep venous thrombosis) (HCC)    right leg in 2011; prior injury to right foot   Echocardiogram 07/2021   Echocardiogram 8/22: EF 60-65, Andrew [name] RWMA, mild LVH, low normal RVSF, trivial AI, mild AV sclerosis w/o AS   HLD (hyperlipidemia)    Right foot injury    Past Surgical History:  Procedure Laterality Date   Andrew [name] PAST SURGERIES     Social History   Socioeconomic History   Marital status: Divorced    Spouse name: Not on file   Number of children: Not on  file   Years of education: Not on file   Highest education level: Not on file  Occupational History   Not on file  Tobacco Use   Smoking status: Former    Types: Cigarettes    Quit date: 07/25/1982    Years since quitting: 39.8   Smokeless tobacco: Never   Tobacco comments:    REMOTE SOCIAL HISTORY  Substance and Sexual Activity   Alcohol use: Andrew [name]   Drug use: Andrew [name]   Sexual activity: Not Currently  Other Topics Concern   Not on file  Social History Narrative   Not on file   Social Determinants of Health   Financial Resource Strain: Not on file  Food Insecurity: Not on file  Transportation Needs: Not on file  Physical Activity: Not on file  Stress: Not on file  Social Connections: Not on file   Allergies  Allergen Reactions   Penicillins Anaphylaxis   Influenza Vaccines     Broke out in hives and had breathing problems   Family History  Problem Relation Age of Onset   Heart disease Mother    Heart disease Father    Vascular Disease Maternal Grandfather    Neuropathy Neg Hx      Current Outpatient Medications (Cardiovascular):    atorvastatin (LIPITOR) 40 MG tablet, TAKE 1 TABLET (40 MG TOTAL) BY MOUTH DAILY AT 6 PM.  Current  Outpatient Medications (Respiratory):    fluticasone furoate-vilanterol (BREO ELLIPTA) 100-25 MCG/ACT AEPB, Inhale 1 puff into the lungs daily.   fluticasone-salmeterol (ADVAIR HFA) 115-21 MCG/ACT inhaler, INHALE 2 PUFFS INTO THE LUNGS TWICE A DAY   montelukast (SINGULAIR) 10 MG tablet, Take 1 tablet (10 mg total) by mouth at bedtime.  Current Outpatient Medications (Analgesics):    allopurinol (ZYLOPRIM) 100 MG tablet, TAKE 2 TABLETS BY MOUTH EVERY DAY   meloxicam (MOBIC) 15 MG tablet, TAKE 1 TABLET (15 MG TOTAL) BY MOUTH DAILY.   traMADol (ULTRAM) 50 MG tablet, Take 1 tablet (50 mg total) by mouth every 8 (eight) hours as needed.  Current Outpatient Medications (Hematological):    rivaroxaban (XARELTO) 10 MG TABS tablet, Take 1 tablet (10  mg total) by mouth daily.  Current Outpatient Medications (Other):    cyclobenzaprine (FLEXERIL) 10 MG tablet, Take 1 tablet (10 mg total) by mouth 3 (three) times daily as needed for muscle spasms.   diclofenac Sodium (VOLTAREN) 1 % GEL, Place onto the skin.   tiZANidine (ZANAFLEX) 4 MG tablet, TAKE 1 TABLET BY MOUTH EVERY DAY AS ONE DOSE   Vitamin D, Ergocalciferol, (DRISDOL) 1.25 MG (50000 UNIT) CAPS capsule, Take 1 capsule (50,000 Units total) by mouth every 7 (seven) days.   Reviewed prior external information including notes and imaging from  primary care provider As well as notes that were available from care everywhere and other healthcare systems.  Past medical history, social, surgical and family history all reviewed in electronic medical record.  Andrew [name] pertanent information unless stated regarding to the chief complaint.   Review of Systems:  Andrew [name] headache, visual changes, nausea, vomiting, diarrhea, constipation, dizziness, abdominal pain, skin rash, fevers, chills, night sweats, weight loss, swollen lymph nodes, body aches,  chest pain, shortness of breath, mood changes. POSITIVE muscle aches, joint swelling  Objective  Blood pressure 124/82, pulse 63, height $RemoveBe'5\' 10"'PKEqhhWQf$  (1.778 m), weight 190 lb (86.2 kg), SpO2 94 %.   General: Andrew [name] apparent distress alert and oriented x3 mood and affect normal, dressed appropriately.  HEENT: Pupils equal, extraocular movements intact  Respiratory: Patient's speak in full sentences and does not appear short of breath  Patient does have pitting edema of the lower extremities bilaterally 2+.  This goes all the way up to mid thigh. Left knee does have the postsurgical changes but does have significant effusion of the knee noted.  This seems to be more of a on the anterior aspect of the knee.  Severely tender to palpation on the inferior aspect of the patella and the patella tendon.  Patient does have lacking the last 5 degrees of extension.  Has 90 degrees of  flexion which was fairly good.  Limited muscular skeletal ultrasound was performed and interpreted by Andrew Andrew [name], M  Limited ultrasound shows the patient has significant soft tissue swelling noted. Patient does have a cortical irregularity noted of the patella on the inferior aspect.  This seems to have some calcific changes noted that is consistent with a possible infection.  Patient has a significant tearing also noted at the patella tendon.  Patient does have some hypoechoic changes of the tibial component of the replacement but Andrew [name] increase in Doppler flow. Impression: Patella fracture as well as tearing of the patella tendon       Impression and Recommendations:

## 2022-05-26 NOTE — Therapy (Addendum)
OUTPATIENT PHYSICAL THERAPY TREATMENT NOTE   Patient Name: Andrew [name] MRN: [id number] FHQ:[id number], 71 y.o., male Today's Date: 05/26/2022  PCP: Bernerd Limbo, MD REFERRING PROVIDER: Lyndal Pulley, DO  END OF SESSION:   PT End of Session - 05/26/22 1409     Visit Number 13    Number of Visits 12    Date for PT Re-Evaluation 08/24/22    Authorization Type Medicare;  12 visits per Dr. Wynelle Link    PT Start Time 1116    PT Stop Time 1200    PT Time Calculation (min) 44 min    Activity Tolerance Patient tolerated treatment well;No increased pain    Behavior During Therapy [facility] for tasks assessed/performed                Past Medical History:  Diagnosis Date   DVT (deep venous thrombosis) (HCC)    right leg in 2011; prior injury to right foot   Echocardiogram 07/2021   Echocardiogram 8/22: EF 60-65, no RWMA, mild LVH, low normal RVSF, trivial AI, mild AV sclerosis w/o AS   HLD (hyperlipidemia)    Right foot injury    Past Surgical History:  Procedure Laterality Date   NO PAST SURGERIES     Patient Active Problem List   Diagnosis Date Noted   Acute bronchitis 02/18/2022   Loss of transverse plantar arch of right foot 12/17/2021   COVID-19 long hauler manifesting chronic cough 10/28/2021   History of COVID-19 10/28/2021   Effusion of left knee 09/09/2021   History of deep vein thrombosis (DVT) of lower extremity 05/13/2021   Degenerative cervical disc 01/16/2021   Right shoulder pain 01/16/2021   Degenerative arthritis of knee 05/28/2020   Spondylosis without myelopathy or radiculopathy, lumbar region 10/11/2016   Migraines 06/08/2016   Nuclear sclerotic cataract of both eyes 06/08/2016   Presbyopia 06/08/2016   Hypermetropia of both eyes 06/08/2016   Long term current use of anticoagulant 06/02/2016   Recurrent right inguinal hernia 12/05/2014   Chronic venous insufficiency 11/28/2014   Bradycardia 10/11/2012   Right leg DVT ([place]) 10/03/2012   HLD  (hyperlipidemia) 07/25/2012   REFERRING DIAG: G54.982 (ICD-10-CM) - Presence of left artificial knee joint   THERAPY DIAG:  Acute pain of left knee   Stiffness of left knee, not elsewhere classified   Muscle weakness (generalized)   PERTINENT HISTORY: See above   PRECAUTIONS: none   SUBJECTIVE: Presents with SIL.  Pain continues but "ok"  PAIN:  Are you having pain? YES Pain location: left knee  Pain description: 8-9/10 Aggravating factors: night/sleeping, stairs Relieving factors: medication, rest   OBJECTIVE:    DIAGNOSTIC FINDINGS: OA   PATIENT SURVEYS:  FOTO 44% 05/24/2022: FOTO44%    COGNITION:           Overall cognitive status: Within functional limits for tasks assessed                            POSTURE:  Flexed knee in supine unable to fully extend   PALPATION: Purple bruising medial left thigh; dressing over surgical incision; moderate edema apparent    LE ROM:   Active ROM Right 4/4 Left 4/4 Left  03/22/22 Left 04/05/22 LEFT 05/24/2022   Hip flexion          Hip extension          Hip abduction          Hip adduction  Hip internal rotation          Hip external rotation          Knee flexion 130 54 70 103 112  Knee extension 0 -25    -9  Ankle dorsiflexion          Ankle plantarflexion          Ankle inversion          Ankle eversion           (Blank rows = not tested)   LE MMT:     MMT Right 4/4 Left 4/4 Left 05/24/2022   Hip flexion 5 2 4/5  Hip extension       Hip abduction 5 3 4/5  Hip adduction       Hip internal rotation       Hip external rotation       Knee flexion 5 2 4/5  Knee extension 5 2 4/5  Ankle dorsiflexion       Ankle plantarflexion       Ankle inversion       Ankle eversion        (Blank rows = not tested)     FUNCTIONAL TESTS:  Needs assist to lift leg on/off table  05/24/2022 I with ADLs and mobility but does have pain   GAIT: Distance walked: 300' Assistive device utilized: no AD Level of  assistance: Modified independence Comments: noted left knee decreased extension with stance phase, Rt lateral trunk lean with Lt leg limb advancement.        TODAY'S TREATMENT:    Pt seen for aquatic therapy today.  Treatment took place in water 3.25-4.8 ft in depth at the [facility] pool. Temp of water was 91.  Pt entered/exited the pool via stairs (step through pattern) independently with bilat rail.  Introduction to setting Walking forwad, back and side stepping without UE support multiple widths Hip extension and quad manual stretching holding to wall Seated flutter(SLR), add/abd. Cues for increasing speed to increase resistance Step ups bottom step 32f leading R/L x 10. Reports some discomfort. LAQ knee suspened by noodle 2x12. Cues for increased speed Squats 2x10 374f  STS with increased load beginning on bench rising onto water step in 3 ft x 10; 3rd water step x8; then 4th water step x10. Cues for positioning, immediate standing balance and core engagement Walking between exercises for recovery  Pt requires buoyancy for support and to offload joints with strengthening exercises. Viscosity of the water is needed for resistance of strengthening; water current perturbations provides challenge to standing balance unsupported, requiring increased core activation.        PATIENT EDUCATION:  Education details: Continue pain control and normalization of gait,  Person educated: Patient and Caregiver Education method: Explanation, Demonstration, and Handouts Education comprehension: verbalized understanding and returned demonstration     HOME EXERCISE PROGRAM: Access Code: [email or web address] URL: [email or web address] Date: 03/16/2022 Prepared by: StRuben Im Exercises - Seated Heel Slide  - 3 x daily - 7 x weekly - 1 sets - 20 reps - Hip Flexor Stretch on Step  - 3 x daily - 7 x weekly - 1 sets - 20 reps - Backward Weight Shift and Opposite Arm Raise with  Walker  - 3 x daily - 7 x weekly - 1 sets - 20 reps  04/02/22: Seated Hamstring Stretch  - 3 x daily - 7 x weekly - 5 reps - 10 hold ASSESSMENT:   CLINICAL  IMPRESSION: Pt demonstrates safety and indep in environment. Able to negotiate steps in and out of water with reports of discomfort but indep reciprocal motion. Able to gain good left muscle engagement with benefit of buoyancy to decreasing joint stress therefore pain.  Pt directed through stretching as well as strengthening exercises which he tolerates well. He reports less discomfort with activity with added flexibility.  He is a good candidate for aquatic therapy to facilitate and hasten progress towards goals.  He is intending to return home to [place] as soon as able.  Pt  continues to have anterior knee pain, limited with stairs but reports he is still have to complete all ADLs, walking up to a mile without AD, and does HEP. Pt presents for reassessment and re certification after meeting with MD and requesting aquatic therapy for improved pain control, improved mobility with added benefit of buoyancy to decrease stress at joint. Patient and brother present throughout session, denied additional questions and agreeable to attempt aquatic session then follow up back up with MD. Pt also educated to reach back out to MD if symptoms do not change or get worse.      OBJECTIVE IMPAIRMENTS decreased activity tolerance, decreased mobility, difficulty walking, decreased ROM, decreased strength, increased edema, impaired perceived functional ability, and pain.    ACTIVITY LIMITATIONS community activity and driving.    PERSONAL FACTORS Language barrier and other affected body regions  affect patient's functional outcome.      REHAB POTENTIAL: Good   CLINICAL DECISION MAKING: Stable/uncomplicated   EVALUATION COMPLEXITY: Low     GOALS: Goals reviewed with patient? Yes   SHORT TERM GOALS: Target date: 04/13/2022   The patient will demonstrate  knowledge of basic self care strategies and exercises to promote healing   Baseline: Goal status: MET   2.  The patient will have improved knee ROM 10-90 degrees for improved ability to get in/out of the car Baseline:  Goal status: MET   3.  The patient will be able to walk household distances with a cane Baseline:  Goal status: MET   4.  The patient will have motor control/strength to maneuver LE in/out of bed without UE assist  Baseline:  Goal status: MET     LONG TERM GOALS: Target date: 05/11/2022   The patient will be independent in a safe self progression of a home exercise program to promote further recovery of function   Baseline:  Goal status: MET 05/24/2022    2.  The patient will have improved knee ROM 6-120 degrees needed to ascend/descend steps at home Baseline:  Goal status: NOT MET   3.  The patient will have improved gait stamina and speed needed to ambulate >300 feet with cane Baseline:  Goal status: MET    4.  The patient will have improved LE strength to at least 4/5 needed for standing to cook and future return to fly fishing Baseline:  Goal status: MET   5.  FOTO score improved to 77% indicating improved function with less pain Baseline:  Goal status: NOT MET  6. Pt to walk 500' with to  without cane with minimal limping or compensations in gait mechanics for improved gait pattern.   Baseline:  Goal status: NEW   PLAN: PT FREQUENCY: 2x/week   PT DURATION: 8 weeks up to 12 visits   PLANNED INTERVENTIONS: Therapeutic exercises, Therapeutic activity, Neuromuscular re-education, Balance training, Gait training, Patient/Family education, Joint mobilization, Stair training, Aquatic Therapy, Dry Needling, Electrical stimulation, Cryotherapy,  Taping, Vasopneumatic device, Ionotophoresis 8m/ml Dexamethasone, and Manual therapy   PLAN FOR NEXT SESSION: aquatics for mobility and decreased pain  Domnique Vantine (Frankie) Walter Grima MPT 06/14/236:31 PM   Addended  06/09/22 MAnnamarie Major [email or web address]

## 2022-05-27 ENCOUNTER — Ambulatory Visit (INDEPENDENT_AMBULATORY_CARE_PROVIDER_SITE_OTHER): Payer: Medicare Other | Admitting: Family Medicine

## 2022-05-27 ENCOUNTER — Encounter: Payer: Self-pay | Admitting: Family Medicine

## 2022-05-27 ENCOUNTER — Ambulatory Visit: Payer: Medicare Other

## 2022-05-27 ENCOUNTER — Ambulatory Visit (HOSPITAL_BASED_OUTPATIENT_CLINIC_OR_DEPARTMENT_OTHER): Payer: Medicare Other | Admitting: Physical Therapy

## 2022-05-27 ENCOUNTER — Ambulatory Visit: Payer: Self-pay

## 2022-05-27 VITALS — BP 124/82 | HR 63 | Ht 70.0 in | Wt 190.0 lb

## 2022-05-27 DIAGNOSIS — M25562 Pain in left knee: Secondary | ICD-10-CM | POA: Diagnosis not present

## 2022-05-27 DIAGNOSIS — G8929 Other chronic pain: Secondary | ICD-10-CM

## 2022-05-27 DIAGNOSIS — S82092A Other fracture of left patella, initial encounter for closed fracture: Secondary | ICD-10-CM | POA: Diagnosis not present

## 2022-05-27 DIAGNOSIS — S82002A Unspecified fracture of left patella, initial encounter for closed fracture: Secondary | ICD-10-CM | POA: Insufficient documentation

## 2022-05-27 MED ORDER — VITAMIN D (ERGOCALCIFEROL) 1.25 MG (50000 UNIT) PO CAPS: 50000.0000 [IU] | ORAL_CAPSULE | ORAL | 0 refills | Status: DC

## 2022-05-27 MED ORDER — MONTELUKAST SODIUM 10 MG PO TABS: 10.0000 mg | ORAL_TABLET | Freq: Every day | ORAL | 0 refills | Status: DC

## 2022-05-27 NOTE — Patient Instructions (Addendum)
Once weekly Vit D Singulair '10mg'$  daily-remember black box warning See me again in 6-8 weeks

## 2022-05-27 NOTE — Assessment & Plan Note (Signed)
Patient does have an avulsion fracture noted.  On ultrasound today to patient does have significant tearing also noted in the patella tendon.  We discussed potentially making patient somewhat nonweightbearing but continuing to try to do range of motion of the knee with a recent replacement.  Patient does have a edema noted as well from his chronic venous insufficiency that also makes it somewhat difficult.  Patient does have some hypoechoic changes within the knee but this could be just postsurgical anything else.  Do not significant seems to be an allergic reaction.  Some evidence has shown back decrease swelling and after surgery with Singulair short-term so we will give him this as well. With black box warning.  Patient's brother helped translate.  Follow-up with me again in 6 to 8 weeks otherwise and encouraged him to follow-up with the surgeon.  Total time with family 33 minutes

## 2022-06-02 ENCOUNTER — Encounter (HOSPITAL_BASED_OUTPATIENT_CLINIC_OR_DEPARTMENT_OTHER): Payer: Self-pay | Admitting: Physical Therapy

## 2022-06-02 ENCOUNTER — Ambulatory Visit (HOSPITAL_BASED_OUTPATIENT_CLINIC_OR_DEPARTMENT_OTHER): Payer: Self-pay | Admitting: Physical Therapy

## 2022-06-02 ENCOUNTER — Ambulatory Visit (HOSPITAL_BASED_OUTPATIENT_CLINIC_OR_DEPARTMENT_OTHER): Payer: Medicare Other | Admitting: Physical Therapy

## 2022-06-02 DIAGNOSIS — R262 Difficulty in walking, not elsewhere classified: Secondary | ICD-10-CM

## 2022-06-02 DIAGNOSIS — M25562 Pain in left knee: Secondary | ICD-10-CM

## 2022-06-02 DIAGNOSIS — M6281 Muscle weakness (generalized): Secondary | ICD-10-CM

## 2022-06-02 NOTE — Therapy (Addendum)
OUTPATIENT PHYSICAL THERAPY TREATMENT NOTE   Patient Name: Andrew [name] MRN: [id number] LOV:[birth year], 71 y.o., male Today's Date: 06/02/2022  PCP: Bernerd Limbo, MD REFERRING PROVIDER: Bernerd Limbo, MD  END OF SESSION:   PT End of Session - 06/02/22 1043     Visit Number 14    Number of Visits 24    Date for PT Re-Evaluation 08/24/22    Authorization Type Medicare;  12 visits per Dr. Wynelle Link    PT Start Time 1035    PT Stop Time 1115    PT Time Calculation (min) 40 min    Activity Tolerance Patient tolerated treatment well;No increased pain    Behavior During Therapy [facility] for tasks assessed/performed                Past Medical History:  Diagnosis Date   DVT (deep venous thrombosis) (HCC)    right leg in 2011; prior injury to right foot   Echocardiogram 07/2021   Echocardiogram 8/22: EF 60-65, no RWMA, mild LVH, low normal RVSF, trivial AI, mild AV sclerosis w/o AS   HLD (hyperlipidemia)    Right foot injury    Past Surgical History:  Procedure Laterality Date   NO PAST SURGERIES     Patient Active Problem List   Diagnosis Date Noted   Left patella fracture 05/27/2022   Acute bronchitis 02/18/2022   Loss of transverse plantar arch of right foot 12/17/2021   COVID-19 long hauler manifesting chronic cough 10/28/2021   History of COVID-19 10/28/2021   Effusion of left knee 09/09/2021   History of deep vein thrombosis (DVT) of lower extremity 05/13/2021   Degenerative cervical disc 01/16/2021   Right shoulder pain 01/16/2021   Degenerative arthritis of knee 05/28/2020   Spondylosis without myelopathy or radiculopathy, lumbar region 10/11/2016   Migraines 06/08/2016   Nuclear sclerotic cataract of both eyes 06/08/2016   Presbyopia 06/08/2016   Hypermetropia of both eyes 06/08/2016   Long term current use of anticoagulant 06/02/2016   Recurrent right inguinal hernia 12/05/2014   Chronic venous insufficiency 11/28/2014   Bradycardia 10/11/2012   Right  leg DVT ([place]) 10/03/2012   HLD (hyperlipidemia) 07/25/2012   REFERRING DIAG: J18.841 (ICD-10-CM) - Presence of left artificial knee joint   THERAPY DIAG:  Acute pain of left knee   Stiffness of left knee, not elsewhere classified   Muscle weakness (generalized)   PERTINENT HISTORY: See above   PRECAUTIONS: none   SUBJECTIVE: Pt reports muscle soreness after last visit  PAIN:  Are you having pain? YES Pain location: left knee  Pain description: 6/10 Aggravating factors: night/sleeping, stairs Relieving factors: medication, rest   OBJECTIVE:    DIAGNOSTIC FINDINGS: OA   PATIENT SURVEYS:  FOTO 44% 05/24/2022: FOTO44%    COGNITION:           Overall cognitive status: Within functional limits for tasks assessed                            POSTURE:  Flexed knee in supine unable to fully extend   PALPATION: Purple bruising medial left thigh; dressing over surgical incision; moderate edema apparent    LE ROM:   Active ROM Right 4/4 Left 4/4 Left  03/22/22 Left 04/05/22 LEFT 05/24/2022   Hip flexion          Hip extension          Hip abduction  Hip adduction          Hip internal rotation          Hip external rotation          Knee flexion 130 54 70 103 112  Knee extension 0 -25    -9  Ankle dorsiflexion          Ankle plantarflexion          Ankle inversion          Ankle eversion           (Blank rows = not tested)   LE MMT:     MMT Right 4/4 Left 4/4 Left 05/24/2022   Hip flexion 5 2 4/5  Hip extension       Hip abduction 5 3 4/5  Hip adduction       Hip internal rotation       Hip external rotation       Knee flexion 5 2 4/5  Knee extension 5 2 4/5  Ankle dorsiflexion       Ankle plantarflexion       Ankle inversion       Ankle eversion        (Blank rows = not tested)     FUNCTIONAL TESTS:  Needs assist to lift leg on/off table  05/24/2022 I with ADLs and mobility but does have pain   GAIT: Distance walked: 300' Assistive  device utilized: no AD Level of assistance: Modified independence Comments: noted left knee decreased extension with stance phase, Rt lateral trunk lean with Lt leg limb advancement.        TODAY'S TREATMENT:   Pt seen for aquatic therapy today.  Treatment took place in water 3.25-4.8 ft in depth at the [facility] pool. Temp of water was 91.  Pt entered/exited the pool via stairs (step through pattern) independently with bilat rail.  Introduction to setting Walking forwad, back and side stepping without UE support multiple widths Stretching knee quad, hamstring and gastroc on 2nd step 3x20 s hold ea Adductor set squeezing ball x10 Seated flutter(SLR), add/abd. Cues for increasing speed to increase resistance STS  3rd water step x10 Cues for positioning, immediate standing balance and core engagement STS from 3rd step with adductor set use ball Step ups bottom step 21f leading R/L x 10. Reports some discomfort. Monster walk x 4 widths  Pt requires buoyancy for support and to offload joints with strengthening exercises. Viscosity of the water is needed for resistance of strengthening; water current perturbations provides challenge to standing balance unsupported, requiring increased core activation.       PATIENT EDUCATION:  Education details: Continue pain control and normalization of gait,  Person educated: Patient and Caregiver Education method: Explanation, Demonstration, and Handouts Education comprehension: verbalized understanding and returned demonstration     HOME EXERCISE PROGRAM: Access Code: [email or web address] URL: [email or web address] Date: 03/16/2022 Prepared by: SRuben Im  Exercises - Seated Heel Slide  - 3 x daily - 7 x weekly - 1 sets - 20 reps - Hip Flexor Stretch on Step  - 3 x daily - 7 x weekly - 1 sets - 20 reps - Backward Weight Shift and Opposite Arm Raise with Walker  - 3 x daily - 7 x weekly - 1 sets - 20 reps  04/02/22: Seated  Hamstring Stretch  - 3 x daily - 7 x weekly - 5 reps - 10 hold ASSESSMENT:   CLINICAL IMPRESSION: Slight improvement in pain since  last visit. He reports compliance with HEP and has been walking a lot at home. He presents today with brother who is able to assist with communication. Pt does report slight increase in knee discomfort on lateral aspect as session progresses. Some cuing needed for improved gait, flex of knee with swing to enable heel strike.  Does translate well onto land.  Goals ongoing.   Pt  continues to have anterior knee pain, limited with stairs but reports he is still have to complete all ADLs, walking up to a mile without AD, and does HEP. Pt presents for reassessment and re certification after meeting with MD and requesting aquatic therapy for improved pain control, improved mobility with added benefit of buoyancy to decrease stress at joint. Patient and brother present throughout session, denied additional questions and agreeable to attempt aquatic session then follow up back up with MD. Pt also educated to reach back out to MD if symptoms do not change or get worse.      OBJECTIVE IMPAIRMENTS decreased activity tolerance, decreased mobility, difficulty walking, decreased ROM, decreased strength, increased edema, impaired perceived functional ability, and pain.    ACTIVITY LIMITATIONS community activity and driving.    PERSONAL FACTORS Language barrier and other affected body regions  affect patient's functional outcome.      REHAB POTENTIAL: Good   CLINICAL DECISION MAKING: Stable/uncomplicated   EVALUATION COMPLEXITY: Low     GOALS: Goals reviewed with patient? Yes   SHORT TERM GOALS: Target date: 04/13/2022   The patient will demonstrate knowledge of basic self care strategies and exercises to promote healing   Baseline: Goal status: MET   2.  The patient will have improved knee ROM 10-90 degrees for improved ability to get in/out of the car Baseline:  Goal  status: MET   3.  The patient will be able to walk household distances with a cane Baseline:  Goal status: MET   4.  The patient will have motor control/strength to maneuver LE in/out of bed without UE assist  Baseline:  Goal status: MET     LONG TERM GOALS: Target date: 05/11/2022   The patient will be independent in a safe self progression of a home exercise program to promote further recovery of function   Baseline:  Goal status: MET 05/24/2022    2.  The patient will have improved knee ROM 6-120 degrees needed to ascend/descend steps at home Baseline:  Goal status: NOT MET   3.  The patient will have improved gait stamina and speed needed to ambulate >300 feet with cane Baseline:  Goal status: MET    4.  The patient will have improved LE strength to at least 4/5 needed for standing to cook and future return to fly fishing Baseline:  Goal status: MET   5.  FOTO score improved to 77% indicating improved function with less pain Baseline:  Goal status: NOT MET  6. Pt to walk 500' with to  without cane with minimal limping or compensations in gait mechanics for improved gait pattern.   Baseline:  Goal status: NEW   PLAN: PT FREQUENCY: 2x/week   PT DURATION: 8 weeks up to 12 visits   PLANNED INTERVENTIONS: Therapeutic exercises, Therapeutic activity, Neuromuscular re-education, Balance training, Gait training, Patient/Family education, Joint mobilization, Stair training, Aquatic Therapy, Dry Needling, Electrical stimulation, Cryotherapy, Taping, Vasopneumatic device, Ionotophoresis 31m/ml Dexamethasone, and Manual therapy   PLAN FOR NEXT SESSION: aquatics for mobility and decreased pain  MStanton Kidney(Frankie) Casimira Sutphin MPT 06/21/236:04 PM  Addended 06/09/22 Annamarie Major) Lenette Rau MPT

## 2022-06-07 ENCOUNTER — Ambulatory Visit (HOSPITAL_BASED_OUTPATIENT_CLINIC_OR_DEPARTMENT_OTHER): Payer: Medicare Other | Admitting: Physical Therapy

## 2022-06-07 DIAGNOSIS — R262 Difficulty in walking, not elsewhere classified: Secondary | ICD-10-CM

## 2022-06-07 DIAGNOSIS — M25562 Pain in left knee: Secondary | ICD-10-CM | POA: Diagnosis not present

## 2022-06-07 DIAGNOSIS — M6281 Muscle weakness (generalized): Secondary | ICD-10-CM

## 2022-06-07 NOTE — Therapy (Addendum)
OUTPATIENT PHYSICAL THERAPY TREATMENT NOTE   Patient Name: Andrew [name] MRN: [id number] XKG:[birth year], 71 y.o., male Today's Date: 06/07/2022  PCP: Bernerd Limbo, MD REFERRING PROVIDER: Bernerd Limbo, MD  END OF SESSION:   PT End of Session - 06/07/22 0823     Visit Number 15    Number of Visits 24    Date for PT Re-Evaluation 08/24/22    Authorization Type Medicare;  12 visits per Dr. Wynelle Link    PT Start Time 0815    PT Stop Time 0900    PT Time Calculation (min) 45 min    Activity Tolerance Patient tolerated treatment well;No increased pain    Behavior During Therapy [facility] for tasks assessed/performed                Past Medical History:  Diagnosis Date   DVT (deep venous thrombosis) (HCC)    right leg in 2011; prior injury to right foot   Echocardiogram 07/2021   Echocardiogram 8/22: EF 60-65, no RWMA, mild LVH, low normal RVSF, trivial AI, mild AV sclerosis w/o AS   HLD (hyperlipidemia)    Right foot injury    Past Surgical History:  Procedure Laterality Date   NO PAST SURGERIES     Patient Active Problem List   Diagnosis Date Noted   Left patella fracture 05/27/2022   Acute bronchitis 02/18/2022   Loss of transverse plantar arch of right foot 12/17/2021   COVID-19 long hauler manifesting chronic cough 10/28/2021   History of COVID-19 10/28/2021   Effusion of left knee 09/09/2021   History of deep vein thrombosis (DVT) of lower extremity 05/13/2021   Degenerative cervical disc 01/16/2021   Right shoulder pain 01/16/2021   Degenerative arthritis of knee 05/28/2020   Spondylosis without myelopathy or radiculopathy, lumbar region 10/11/2016   Migraines 06/08/2016   Nuclear sclerotic cataract of both eyes 06/08/2016   Presbyopia 06/08/2016   Hypermetropia of both eyes 06/08/2016   Long term current use of anticoagulant 06/02/2016   Recurrent right inguinal hernia 12/05/2014   Chronic venous insufficiency 11/28/2014   Bradycardia 10/11/2012   Right  leg DVT ([place]) 10/03/2012   HLD (hyperlipidemia) 07/25/2012   REFERRING DIAG: S97.026 (ICD-10-CM) - Presence of left artificial knee joint   THERAPY DIAG:  Acute pain of left knee   Stiffness of left knee, not elsewhere classified   Muscle weakness (generalized)   PERTINENT HISTORY: See above   PRECAUTIONS: none   SUBJECTIVE: Pt reports edema lat aspect of knee after last session.  Pain low in am 3/10, increases to 7-8/10 by night time  PAIN:  Are you having pain? YES Pain location: left knee  Pain description: 3/10 Aggravating factors: night/sleeping, stairs Relieving factors: medication, rest   OBJECTIVE:    DIAGNOSTIC FINDINGS: OA   PATIENT SURVEYS:  FOTO 44% 05/24/2022: FOTO44%    COGNITION:           Overall cognitive status: Within functional limits for tasks assessed                            POSTURE:  Flexed knee in supine unable to fully extend   PALPATION: Purple bruising medial left thigh; dressing over surgical incision; moderate edema apparent    LE ROM:   Active ROM Right 4/4 Left 4/4 Left  03/22/22 Left 04/05/22 LEFT 05/24/2022   Hip flexion          Hip extension  Hip abduction          Hip adduction          Hip internal rotation          Hip external rotation          Knee flexion 130 54 70 103 112  Knee extension 0 -25    -9  Ankle dorsiflexion          Ankle plantarflexion          Ankle inversion          Ankle eversion           (Blank rows = not tested)   LE MMT:     MMT Right 4/4 Left 4/4 Left 05/24/2022   Hip flexion 5 2 4/5  Hip extension       Hip abduction 5 3 4/5  Hip adduction       Hip internal rotation       Hip external rotation       Knee flexion 5 2 4/5  Knee extension 5 2 4/5  Ankle dorsiflexion       Ankle plantarflexion       Ankle inversion       Ankle eversion        (Blank rows = not tested)     FUNCTIONAL TESTS:  Needs assist to lift leg on/off table  05/24/2022 I with ADLs and mobility  but does have pain   GAIT: Distance walked: 300' Assistive device utilized: no AD Level of assistance: Modified independence Comments: noted left knee decreased extension with stance phase, Rt lateral trunk lean with Lt leg limb advancement.        TODAY'S TREATMENT:   Pt seen for aquatic therapy today.  Treatment took place in water 3.25-4.8 ft in depth at the [facility] pool. Temp of water was 91.  Pt entered/exited the pool via stairs (step through pattern) independently with bilat rail.  Introduction to setting Walking forwad, back and side stepping without UE support multiple widths Stretching knee quad, hamstring and gastroc on 2nd step 3x20 s hold ea Adductor set squeezing ball x10 Seated flutter(SLR) Cues for increasing speed to increase resistance STS  3rd water step x10 Cues for positioning, immediate standing balance and core engagement STS from 3rd step with adductor set use ball Step ups bottom step 69ft leading R/L x 10. Reports some discomfort. Squats x10 Split knee squats leading R/L x10 holding to noodle Cycling straddling yellow noodle, holding to corner of pool.   Pt requires buoyancy for support and to offload joints with strengthening exercises. Viscosity of the water is needed for resistance of strengthening; water current perturbations provides challenge to standing balance unsupported, requiring increased core activation.      PATIENT EDUCATION:  Education details: Continue pain control and normalization of gait,  Person educated: Patient and Caregiver Education method: Explanation, Demonstration, and Handouts Education comprehension: verbalized understanding and returned demonstration     HOME EXERCISE PROGRAM: Access Code: [email or web address] URL: [email or web address] Date: 03/16/2022 Prepared by: Ruben Im   Exercises - Seated Heel Slide  - 3 x daily - 7 x weekly - 1 sets - 20 reps - Hip Flexor Stretch on Step  - 3 x daily - 7  x weekly - 1 sets - 20 reps - Backward Weight Shift and Opposite Arm Raise with Walker  - 3 x daily - 7 x weekly - 1 sets - 20 reps  04/02/22: Seated Hamstring Stretch  -  3 x daily - 7 x weekly - 5 reps - 10 hold ASSESSMENT:   CLINICAL IMPRESSION: Pt reports lat knee pain after last session. Brother states he had a "goose egg" develop on lat aspect /joint line L knee which he iced and used OTC remedies.  Resolved at the time.  After session today pt with minor inflammation at same site. Lateral movements with knee flex increases discomfort. All activities today completed in linear patterns forward and back. Suggested trial of k-tape which we will complete next visit to decreased edema and pain.  He is instructed to shave hair off of knee area prior to next visit.  He and family in agreement. Overall pain has reportedly decreased over the last month. Goal ongoing.     Pt  continues to have anterior knee pain, limited with stairs but reports he is still have to complete all ADLs, walking up to a mile without AD, and does HEP. Pt presents for reassessment and re certification after meeting with MD and requesting aquatic therapy for improved pain control, improved mobility with added benefit of buoyancy to decrease stress at joint. Patient and brother present throughout session, denied additional questions and agreeable to attempt aquatic session then follow up back up with MD. Pt also educated to reach back out to MD if symptoms do not change or get worse.      OBJECTIVE IMPAIRMENTS decreased activity tolerance, decreased mobility, difficulty walking, decreased ROM, decreased strength, increased edema, impaired perceived functional ability, and pain.    ACTIVITY LIMITATIONS community activity and driving.    PERSONAL FACTORS Language barrier and other affected body regions  affect patient's functional outcome.      REHAB POTENTIAL: Good   CLINICAL DECISION MAKING: Stable/uncomplicated    EVALUATION COMPLEXITY: Low     GOALS: Goals reviewed with patient? Yes   SHORT TERM GOALS: Target date: 04/13/2022   The patient will demonstrate knowledge of basic self care strategies and exercises to promote healing   Baseline: Goal status: MET   2.  The patient will have improved knee ROM 10-90 degrees for improved ability to get in/out of the car Baseline:  Goal status: MET   3.  The patient will be able to walk household distances with a cane Baseline:  Goal status: MET   4.  The patient will have motor control/strength to maneuver LE in/out of bed without UE assist  Baseline:  Goal status: MET     LONG TERM GOALS: Target date: 05/11/2022   The patient will be independent in a safe self progression of a home exercise program to promote further recovery of function   Baseline:  Goal status: MET 05/24/2022    2.  The patient will have improved knee ROM 6-120 degrees needed to ascend/descend steps at home Baseline:  Goal status: NOT MET   3.  The patient will have improved gait stamina and speed needed to ambulate >300 feet with cane Baseline:  Goal status: MET    4.  The patient will have improved LE strength to at least 4/5 needed for standing to cook and future return to fly fishing Baseline:  Goal status: MET   5.  FOTO score improved to 77% indicating improved function with less pain Baseline:  Goal status: NOT MET  6. Pt to walk 500' with to  without cane with minimal limping or compensations in gait mechanics for improved gait pattern.   Baseline:  Goal status: NEW   PLAN: PT FREQUENCY: 2x/week  PT DURATION: 8 weeks up to 12 visits   PLANNED INTERVENTIONS: Therapeutic exercises, Therapeutic activity, Neuromuscular re-education, Balance training, Gait training, Patient/Family education, Joint mobilization, Stair training, Aquatic Therapy, Dry Needling, Electrical stimulation, Cryotherapy, Taping, Vasopneumatic device, Ionotophoresis 4mg /ml  Dexamethasone, and Manual therapy   PLAN FOR NEXT SESSION: aquatics for mobility and decreased pain  Annamarie Major) Paytan Recine MPT 06/07/2309:34 AM    Addended 06/09/22 Annamarie Major) [place] MPT

## 2022-06-09 ENCOUNTER — Encounter (HOSPITAL_BASED_OUTPATIENT_CLINIC_OR_DEPARTMENT_OTHER): Payer: Self-pay | Admitting: Physical Therapy

## 2022-06-09 ENCOUNTER — Ambulatory Visit (HOSPITAL_BASED_OUTPATIENT_CLINIC_OR_DEPARTMENT_OTHER): Payer: Medicare Other | Admitting: Physical Therapy

## 2022-06-09 DIAGNOSIS — R262 Difficulty in walking, not elsewhere classified: Secondary | ICD-10-CM

## 2022-06-09 DIAGNOSIS — M25562 Pain in left knee: Secondary | ICD-10-CM

## 2022-06-09 DIAGNOSIS — M6281 Muscle weakness (generalized): Secondary | ICD-10-CM

## 2022-06-09 NOTE — Therapy (Addendum)
OUTPATIENT PHYSICAL THERAPY TREATMENT NOTE   Patient Name: Andrew [name] MRN: [id number], 71 y.o., male Today's Date: 06/09/2022  PCP: Bernerd Limbo, MD REFERRING PROVIDER: Bernerd Limbo, MD  END OF SESSION:   PT End of Session - 06/09/22 1032     Visit Number 16    Number of Visits 24    Date for PT Re-Evaluation 08/24/22    Authorization Type Medicare;  12 visits per Dr. Wynelle Link    PT Start Time 1033    PT Stop Time 70    PT Time Calculation (min) 42 min    Activity Tolerance Patient tolerated treatment well    Behavior During Therapy [facility] for tasks assessed/performed                Past Medical History:  Diagnosis Date   DVT (deep venous thrombosis) ([place])    right leg in 2011; prior injury to right foot   Echocardiogram 07/2021   Echocardiogram 8/22: EF 60-65, no RWMA, mild LVH, low normal RVSF, trivial AI, mild AV sclerosis w/o AS   HLD (hyperlipidemia)    Right foot injury    Past Surgical History:  Procedure Laterality Date   NO PAST SURGERIES     Patient Active Problem List   Diagnosis Date Noted   Left patella fracture 05/27/2022   Acute bronchitis 02/18/2022   Loss of transverse plantar arch of right foot 12/17/2021   COVID-19 long hauler manifesting chronic cough 10/28/2021   History of COVID-19 10/28/2021   Effusion of left knee 09/09/2021   History of deep vein thrombosis (DVT) of lower extremity 05/13/2021   Degenerative cervical disc 01/16/2021   Right shoulder pain 01/16/2021   Degenerative arthritis of knee 05/28/2020   Spondylosis without myelopathy or radiculopathy, lumbar region 10/11/2016   Migraines 06/08/2016   Nuclear sclerotic cataract of both eyes 06/08/2016   Presbyopia 06/08/2016   Hypermetropia of both eyes 06/08/2016   Long term current use of anticoagulant 06/02/2016   Recurrent right inguinal hernia 12/05/2014   Chronic venous insufficiency 11/28/2014   Bradycardia 10/11/2012   Right leg DVT ([place])  10/03/2012   HLD (hyperlipidemia) 07/25/2012   REFERRING DIAG: S28.315 (ICD-10-CM) - Presence of left artificial knee joint   THERAPY DIAG:  Acute pain of left knee   Stiffness of left knee, not elsewhere classified   Muscle weakness (generalized)   PERTINENT HISTORY: See above   PRECAUTIONS: none   SUBJECTIVE: I think it is better pain 3/10 today  PAIN:  Are you having pain? YES Pain location: left knee  Pain description: 3/10 Aggravating factors: night/sleeping, stairs Relieving factors: medication, rest   OBJECTIVE:    DIAGNOSTIC FINDINGS: OA   PATIENT SURVEYS:  FOTO 44% 05/24/2022: FOTO44%    COGNITION:           Overall cognitive status: Within functional limits for tasks assessed                            POSTURE:  Flexed knee in supine unable to fully extend   PALPATION: Purple bruising medial left thigh; dressing over surgical incision; moderate edema apparent    LE ROM:   Active ROM Right 4/4 Left 4/4 Left  03/22/22 Left 04/05/22 LEFT 05/24/2022   Hip flexion          Hip extension          Hip abduction          Hip  adduction          Hip internal rotation          Hip external rotation          Knee flexion 130 54 70 103 112  Knee extension 0 -25    -9  Ankle dorsiflexion          Ankle plantarflexion          Ankle inversion          Ankle eversion           (Blank rows = not tested)   LE MMT:     MMT Right 4/4 Left 4/4 Left 05/24/2022   Hip flexion 5 2 4/5  Hip extension       Hip abduction 5 3 4/5  Hip adduction       Hip internal rotation       Hip external rotation       Knee flexion 5 2 4/5  Knee extension 5 2 4/5  Ankle dorsiflexion       Ankle plantarflexion       Ankle inversion       Ankle eversion        (Blank rows = not tested)     FUNCTIONAL TESTS:  Needs assist to lift leg on/off table  05/24/2022 I with ADLs and mobility but does have pain   GAIT: Distance walked: 300' Assistive device utilized: no  AD Level of assistance: Modified independence Comments: noted left knee decreased extension with stance phase, Rt lateral trunk lean with Lt leg limb advancement.        TODAY'S TREATMENT:     Pt seen for aquatic therapy today.  Treatment took place in water 3.25-4.8 ft in depth at the [facility] pool. Temp of water was 91.  Pt entered/exited the pool via stairs (step through pattern) independently with bilat rail.   Walking forwad, back and side stepping without UE support multiple widths Stretching knee quad on 2nd step 3x20 s hold ea Stretching hamstring and gastroc using yellow noodle standing 3x30s Adductor set squeezing ball x10 STS from 3rd step with adductor set use ball Step ups bottom step 60ft leading R/L x 10. Reports some discomfort. Squats x10 Seated flutter(SLR) Cues for increasing speed to increase resistance Backward then forward lunge R/L x10 ea Holding to yellow noodle hip flex ext alternating le x10 ea SLS 26ft R/L  then completed in 3 ft. Pt with minor adjustments but able to hold after several tries x20s+  Once pt dried off:  I strips of reg Rock tape applied to Lt medial and lateral knee at joint line, crossing at tibial tuberosity with 25% to decompress tissue and increase proprioception. Pt and family instructed in safe tape removal, rationale, etc; they verbalized understanding. JCL  Pt requires buoyancy for support and to offload joints with strengthening exercises. Viscosity of the water is needed for resistance of strengthening; water current perturbations provides challenge to standing balance unsupported, requiring increased core activation.       PATIENT EDUCATION:  Education details: Continue pain control and normalization of gait,  Person educated: Patient and Caregiver Education method: Explanation, Demonstration, and Handouts Education comprehension: verbalized understanding and returned demonstration     HOME EXERCISE  PROGRAM: Access Code: [email or web address] URL: [email or web address] Date: 03/16/2022 Prepared by: Ruben Im   Exercises - Seated Heel Slide  - 3 x daily - 7 x weekly - 1 sets - 20 reps - Hip Flexor  Stretch on Step  - 3 x daily - 7 x weekly - 1 sets - 20 reps - Backward Weight Shift and Opposite Arm Raise with Walker  - 3 x daily - 7 x weekly - 1 sets - 20 reps  04/02/22: Seated Hamstring Stretch  - 3 x daily - 7 x weekly - 5 reps - 10 hold ASSESSMENT:   CLINICAL IMPRESSION: Pt reports decreasing pain since last visit. Feels his endurance and toleration to activity improving. Less edema at night.  Added balance challenges for core strength and increased proprioceptive input through lle. Pt requires moderate VC for proper execution of exercises. Trial of k-tape as noted above. Goals ongoing    OBJECTIVE IMPAIRMENTS decreased activity tolerance, decreased mobility, difficulty walking, decreased ROM, decreased strength, increased edema, impaired perceived functional ability, and pain.    ACTIVITY LIMITATIONS community activity and driving.    PERSONAL FACTORS Language barrier and other affected body regions  affect patient's functional outcome.      REHAB POTENTIAL: Good   CLINICAL DECISION MAKING: Stable/uncomplicated   EVALUATION COMPLEXITY: Low     GOALS: Goals reviewed with patient? Yes   SHORT TERM GOALS: Target date: 04/13/2022   The patient will demonstrate knowledge of basic self care strategies and exercises to promote healing   Baseline: Goal status: MET   2.  The patient will have improved knee ROM 10-90 degrees for improved ability to get in/out of the car Baseline:  Goal status: MET   3.  The patient will be able to walk household distances with a cane Baseline:  Goal status: MET   4.  The patient will have motor control/strength to maneuver LE in/out of bed without UE assist  Baseline:  Goal status: MET     LONG TERM GOALS: Target date: 05/11/2022    The patient will be independent in a safe self progression of a home exercise program to promote further recovery of function   Baseline:  Goal status: MET 05/24/2022    2.  The patient will have improved knee ROM 6-120 degrees needed to ascend/descend steps at home Baseline:  Goal status: NOT MET   3.  The patient will have improved gait stamina and speed needed to ambulate >300 feet with cane Baseline:  Goal status: MET    4.  The patient will have improved LE strength to at least 4/5 needed for standing to cook and future return to fly fishing Baseline:  Goal status: MET   5.  FOTO score improved to 77% indicating improved function with less pain Baseline:  Goal status: NOT MET  6. Pt to walk 500' with to  without cane with minimal limping or compensations in gait mechanics for improved gait pattern.   Baseline:  Goal status: NEW   PLAN: PT FREQUENCY: 2x/week   PT DURATION: 8 weeks up to 12 visits   PLANNED INTERVENTIONS: Therapeutic exercises, Therapeutic activity, Neuromuscular re-education, Balance training, Gait training, Patient/Family education, Joint mobilization, Stair training, Aquatic Therapy, Dry Needling, Electrical stimulation, Cryotherapy, Taping, Vasopneumatic device, Ionotophoresis 20m/ml Dexamethasone, and Manual therapy   PLAN FOR NEXT SESSION: aquatics for mobility and decreased pain  MAnnamarie Major Luian Schumpert MPT 06/09/2309:34 AM   Addended 06/09/22 12:37p Sherald Balbuena (Tharon Aquas [email or web address]

## 2022-06-14 ENCOUNTER — Encounter: Payer: Self-pay | Admitting: Gastroenterology

## 2022-06-14 ENCOUNTER — Telehealth: Payer: Self-pay | Admitting: Gastroenterology

## 2022-06-18 ENCOUNTER — Encounter (HOSPITAL_BASED_OUTPATIENT_CLINIC_OR_DEPARTMENT_OTHER): Payer: Self-pay | Admitting: Physical Therapy

## 2022-06-18 ENCOUNTER — Ambulatory Visit (HOSPITAL_BASED_OUTPATIENT_CLINIC_OR_DEPARTMENT_OTHER): Payer: Medicare Other | Attending: Family Medicine | Admitting: Physical Therapy

## 2022-06-18 DIAGNOSIS — R262 Difficulty in walking, not elsewhere classified: Secondary | ICD-10-CM | POA: Insufficient documentation

## 2022-06-18 DIAGNOSIS — M25662 Stiffness of left knee, not elsewhere classified: Secondary | ICD-10-CM | POA: Insufficient documentation

## 2022-06-18 DIAGNOSIS — M25562 Pain in left knee: Secondary | ICD-10-CM | POA: Diagnosis not present

## 2022-06-18 DIAGNOSIS — M6281 Muscle weakness (generalized): Secondary | ICD-10-CM | POA: Insufficient documentation

## 2022-06-18 NOTE — Therapy (Signed)
OUTPATIENT PHYSICAL THERAPY TREATMENT NOTE   Patient Name: Andrew [name] MRN: [id number], 71 y.o., male Today's Date: 06/18/2022  PCP: Bernerd Limbo, MD REFERRING PROVIDER: Bernerd Limbo, MD  END OF SESSION:   PT End of Session - 06/18/22 1035     Visit Number 17    Number of Visits 24    Date for PT Re-Evaluation 08/24/22    Authorization Type Medicare    PT Start Time 1034    PT Stop Time 1112    PT Time Calculation (min) 38 min    Activity Tolerance Patient tolerated treatment well    Behavior During Therapy [facility] for tasks assessed/performed                Past Medical History:  Diagnosis Date   DVT (deep venous thrombosis) ([place])    right leg in 2011; prior injury to right foot   Echocardiogram 07/2021   Echocardiogram 8/22: EF 60-65, no RWMA, mild LVH, low normal RVSF, trivial AI, mild AV sclerosis w/o AS   HLD (hyperlipidemia)    Right foot injury    Past Surgical History:  Procedure Laterality Date   NO PAST SURGERIES     Patient Active Problem List   Diagnosis Date Noted   Left patella fracture 05/27/2022   Acute bronchitis 02/18/2022   Loss of transverse plantar arch of right foot 12/17/2021   COVID-19 long hauler manifesting chronic cough 10/28/2021   History of COVID-19 10/28/2021   Effusion of left knee 09/09/2021   History of deep vein thrombosis (DVT) of lower extremity 05/13/2021   Degenerative cervical disc 01/16/2021   Right shoulder pain 01/16/2021   Degenerative arthritis of knee 05/28/2020   Spondylosis without myelopathy or radiculopathy, lumbar region 10/11/2016   Migraines 06/08/2016   Nuclear sclerotic cataract of both eyes 06/08/2016   Presbyopia 06/08/2016   Hypermetropia of both eyes 06/08/2016   Long term current use of anticoagulant 06/02/2016   Recurrent right inguinal hernia 12/05/2014   Chronic venous insufficiency 11/28/2014   Bradycardia 10/11/2012   Right leg DVT ([place]) 10/03/2012   HLD (hyperlipidemia)  07/25/2012   REFERRING DIAG: J69.678 (ICD-10-CM) - Presence of left artificial knee joint   THERAPY DIAG:  Acute pain of left knee   Stiffness of left knee, not elsewhere classified   Muscle weakness (generalized)   PERTINENT HISTORY: See above   PRECAUTIONS: none   SUBJECTIVE: Per pt's brother (as interpreter), the tape felt restrictive the first day, but then it felt better the following day; "supportive".   He would like tape applied again today.  No irritation from tape. He reports he is able to walk with less difficulty since returning to PT.  Pt's brother voices that he is compliant with his HEP. He is still taking pain medication when it wakes him in the night. Per brother, pt has follow up with Dr. Wynelle Link next week.   PAIN:  Are you having pain? YES Pain location: left knee  Pain description: 4/10 Aggravating factors: night/sleeping, stairs Relieving factors: medication, rest   OBJECTIVE:    DIAGNOSTIC FINDINGS: OA   PATIENT SURVEYS:  FOTO 44% 05/24/2022: FOTO44%    COGNITION:           Overall cognitive status: Within functional limits for tasks assessed                            POSTURE:  Flexed knee in supine unable to fully extend  PALPATION: Purple bruising medial left thigh; dressing over surgical incision; moderate edema apparent    LE ROM:   Active ROM Right 4/4 Left 4/4 Left  03/22/22 Left 04/05/22 LEFT 05/24/2022   Hip flexion          Hip extension          Hip abduction          Hip adduction          Hip internal rotation          Hip external rotation          Knee flexion 130 54 70 103 112  Knee extension 0 -25    -9  Ankle dorsiflexion          Ankle plantarflexion          Ankle inversion          Ankle eversion           (Blank rows = not tested)   LE MMT:     MMT Right 4/4 Left 4/4 Left 05/24/2022   Hip flexion 5 2 4/5  Hip extension       Hip abduction 5 3 4/5  Hip adduction       Hip internal rotation       Hip  external rotation       Knee flexion 5 2 4/5  Knee extension 5 2 4/5  Ankle dorsiflexion       Ankle plantarflexion       Ankle inversion       Ankle eversion        (Blank rows = not tested)     FUNCTIONAL TESTS:  Needs assist to lift leg on/off table  05/24/2022 I with ADLs and mobility but does have pain   GAIT: Distance walked: 300' Assistive device utilized: no AD Level of assistance: Modified independence Comments: noted left knee decreased extension with stance phase, Rt lateral trunk lean with Lt leg limb advancement.        TODAY'S TREATMENT:     Pt seen for aquatic therapy today.  Treatment took place in water 3.25-4.8 ft in depth at the [employer] pool. Temp of water was 91.  Pt entered/exited the pool via stairs (step through pattern) independently with bilat rail.   Walking forwad, back and side stepping without UE support, 2-3 laps of each at 4 ft depth Holding wall: squats x 15 (cues for form, to allow heels to rise when knees flex) Side step into squat R/L  SLS on LLE x 20s with intermittent UE support Tandem gait forward/ backward holding yellow noodle 3 way leg kick holding yellow noodle Forward step down RLE, retro step up LLE with hand on rails x 10 L lateral step up x 8 Forward lunge on step for Lt knee flexion ROM/ knee straight for hamstring stretch Stretching L quad with Lt foot on on 2nd/ 3rd step 15s hold ; trial with foot on bench - too much (inc pain) Forward/ backward walking for rest and recovery in water >4 ft High knee forward marching in 4.5 ft water  Once pt dried off:  I strips of reg Rock tape applied to Lt medial and lateral knee at joint line, crossing at tibial tuberosity with 25% to decompress tissue and increase proprioception.   Pt requires buoyancy for support and to offload joints with strengthening exercises. Viscosity of the water is needed for resistance of strengthening; water current perturbations provides challenge  to standing balance unsupported, requiring increased core activation.  PATIENT EDUCATION:  Education details: Continue pain control and normalization of gait,  Person educated: Patient and Caregiver Education method: Explanation, Demonstration, and Handouts Education comprehension: verbalized understanding and returned demonstration     HOME EXERCISE PROGRAM: Access Code: [email or web address] URL: [email or web address] Date: 03/16/2022 Prepared by: Ruben Im   Exercises - Seated Heel Slide  - 3 x daily - 7 x weekly - 1 sets - 20 reps - Hip Flexor Stretch on Step  - 3 x daily - 7 x weekly - 1 sets - 20 reps - Backward Weight Shift and Opposite Arm Raise with Walker  - 3 x daily - 7 x weekly - 1 sets - 20 reps  04/02/22: Seated Hamstring Stretch  - 3 x daily - 7 x weekly - 5 reps - 10 hold   ASSESSMENT:   CLINICAL IMPRESSION: Pt demonstrated decreased balance on LLE with SLS, requires intermittent UE support.    Pt tolerates Lt quad stretch with foot on 3rd step behind him, but not on bench. Positive response to kinesiology tape; reapplied today at end of session.   Pt has met LTG#6 and is progressing towards remaining LTGs.     OBJECTIVE IMPAIRMENTS decreased activity tolerance, decreased mobility, difficulty walking, decreased ROM, decreased strength, increased edema, impaired perceived functional ability, and pain.    ACTIVITY LIMITATIONS community activity and driving.    PERSONAL FACTORS Language barrier and other affected body regions  affect patient's functional outcome.      REHAB POTENTIAL: Good   CLINICAL DECISION MAKING: Stable/uncomplicated   EVALUATION COMPLEXITY: Low     GOALS: Goals reviewed with patient? Yes   SHORT TERM GOALS: Target date: 04/13/2022   The patient will demonstrate knowledge of basic self care strategies and exercises to promote healing   Baseline: Goal status: MET   2.  The patient will have improved knee ROM 10-90 degrees for  improved ability to get in/out of the car Baseline:  Goal status: MET   3.  The patient will be able to walk household distances with a cane Baseline:  Goal status: MET   4.  The patient will have motor control/strength to maneuver LE in/out of bed without UE assist  Baseline:  Goal status: MET     LONG TERM GOALS: Target date: 05/11/2022   The patient will be independent in a safe self progression of a home exercise program to promote further recovery of function   Baseline:  Goal status: MET 05/24/2022    2.  The patient will have improved knee ROM 6-120 degrees needed to ascend/descend steps at home Baseline:  Goal status: NOT MET   3.  The patient will have improved gait stamina and speed needed to ambulate >300 feet with cane Baseline:  Goal status: MET    4.  The patient will have improved LE strength to at least 4/5 needed for standing to cook and future return to fly fishing Baseline:  Goal status: MET   5.  FOTO score improved to 77% indicating improved function with less pain Baseline:  Goal status: NOT MET  6. Pt to walk 500' with to  without cane with minimal limping or compensations in gait mechanics for improved gait pattern.   Baseline:  Goal status: Achieved 06/18/22   PLAN: PT FREQUENCY: 2x/week   PT DURATION: 8 weeks up to 12 visits   PLANNED INTERVENTIONS: Therapeutic exercises, Therapeutic activity, Neuromuscular re-education, Balance training, Gait training, Patient/Family education,  Joint mobilization, Stair training, Aquatic Therapy, Dry Needling, Electrical stimulation, Cryotherapy, Taping, Vasopneumatic device, Ionotophoresis 71m/ml Dexamethasone, and Manual therapy   PLAN FOR NEXT SESSION: aquatics for mobility and decreased pain; teach family how to tape knee. Add quad stretch to HEP (prone with strap or foot on chair).     JKerin Perna PTA 06/18/22 12:45 PM

## 2022-06-19 ENCOUNTER — Other Ambulatory Visit: Payer: Self-pay | Admitting: Family Medicine

## 2022-06-19 ENCOUNTER — Other Ambulatory Visit: Payer: Self-pay | Admitting: Specialist

## 2022-06-22 ENCOUNTER — Ambulatory Visit (HOSPITAL_BASED_OUTPATIENT_CLINIC_OR_DEPARTMENT_OTHER): Payer: Medicare Other | Admitting: Physical Therapy

## 2022-06-22 ENCOUNTER — Encounter (HOSPITAL_BASED_OUTPATIENT_CLINIC_OR_DEPARTMENT_OTHER): Payer: Self-pay | Admitting: Physical Therapy

## 2022-06-22 DIAGNOSIS — M6281 Muscle weakness (generalized): Secondary | ICD-10-CM

## 2022-06-22 DIAGNOSIS — M25562 Pain in left knee: Secondary | ICD-10-CM | POA: Diagnosis not present

## 2022-06-22 DIAGNOSIS — R262 Difficulty in walking, not elsewhere classified: Secondary | ICD-10-CM

## 2022-06-22 DIAGNOSIS — M25662 Stiffness of left knee, not elsewhere classified: Secondary | ICD-10-CM

## 2022-06-22 NOTE — Therapy (Signed)
OUTPATIENT PHYSICAL THERAPY TREATMENT NOTE   Patient Name: Andrew [name] MRN: [id number] GUR:[birth year], 71 y.o., male Today's Date: 06/22/2022  PCP: Bernerd Limbo, MD REFERRING PROVIDER: Bernerd Limbo, MD  END OF SESSION:   PT End of Session - 06/22/22 0742     Visit Number 18    Number of Visits 24    Date for PT Re-Evaluation 08/24/22    Authorization Type Medicare    PT Start Time 0730    PT Stop Time 0810    PT Time Calculation (min) 40 min    Activity Tolerance Patient tolerated treatment well    Behavior During Therapy [facility] for tasks assessed/performed                Past Medical History:  Diagnosis Date   DVT (deep venous thrombosis) ([place])    right leg in 2011; prior injury to right foot   Echocardiogram 07/2021   Echocardiogram 8/22: EF 60-65, no RWMA, mild LVH, low normal RVSF, trivial AI, mild AV sclerosis w/o AS   HLD (hyperlipidemia)    Right foot injury    Past Surgical History:  Procedure Laterality Date   NO PAST SURGERIES     Patient Active Problem List   Diagnosis Date Noted   Left patella fracture 05/27/2022   Acute bronchitis 02/18/2022   Loss of transverse plantar arch of right foot 12/17/2021   COVID-19 long hauler manifesting chronic cough 10/28/2021   History of COVID-19 10/28/2021   Effusion of left knee 09/09/2021   History of deep vein thrombosis (DVT) of lower extremity 05/13/2021   Degenerative cervical disc 01/16/2021   Right shoulder pain 01/16/2021   Degenerative arthritis of knee 05/28/2020   Spondylosis without myelopathy or radiculopathy, lumbar region 10/11/2016   Migraines 06/08/2016   Nuclear sclerotic cataract of both eyes 06/08/2016   Presbyopia 06/08/2016   Hypermetropia of both eyes 06/08/2016   Long term current use of anticoagulant 06/02/2016   Recurrent right inguinal hernia 12/05/2014   Chronic venous insufficiency 11/28/2014   Bradycardia 10/11/2012   Right leg DVT ([place]) 10/03/2012   HLD (hyperlipidemia)  07/25/2012   REFERRING DIAG: J62.831 (ICD-10-CM) - Presence of left artificial knee joint   THERAPY DIAG:  Acute pain of left knee   Stiffness of left knee, not elsewhere classified   Muscle weakness (generalized)   PERTINENT HISTORY: See above   PRECAUTIONS: none   SUBJECTIVE:  Pt reports he did too much after last session, so he was sore that evening.  He has been doing meditation and this has helped decrease his pain "by 50%". He plans to buy some tape to continue taping knee.   PAIN:  Are you having pain? YES Pain location: left knee  Pain description: 2/10 Aggravating factors: night/sleeping, stairs Relieving factors: medication, rest   OBJECTIVE:    DIAGNOSTIC FINDINGS: OA   PATIENT SURVEYS:  FOTO 44% 05/24/2022: FOTO44%    COGNITION:           Overall cognitive status: Within functional limits for tasks assessed                            POSTURE:  Flexed knee in supine unable to fully extend   PALPATION: Purple bruising medial left thigh; dressing over surgical incision; moderate edema apparent    LE ROM:   Active ROM Right 4/4 Left 4/4 Left  03/22/22 Left 04/05/22 LEFT 05/24/2022   Hip flexion  Hip extension          Hip abduction          Hip adduction          Hip internal rotation          Hip external rotation          Knee flexion 130 54 70 103 112  Knee extension 0 -25    -9  Ankle dorsiflexion          Ankle plantarflexion          Ankle inversion          Ankle eversion           (Blank rows = not tested)   LE MMT:     MMT Right 4/4 Left 4/4 Left 05/24/2022   Hip flexion 5 2 4/5  Hip extension       Hip abduction 5 3 4/5  Hip adduction       Hip internal rotation       Hip external rotation       Knee flexion 5 2 4/5  Knee extension 5 2 4/5  Ankle dorsiflexion       Ankle plantarflexion       Ankle inversion       Ankle eversion        (Blank rows = not tested)     FUNCTIONAL TESTS:  Needs assist to lift leg  on/off table  05/24/2022 I with ADLs and mobility but does have pain   GAIT: Distance walked: 300' Assistive device utilized: no AD Level of assistance: Modified independence Comments: noted left knee decreased extension with stance phase, Rt lateral trunk lean with Lt leg limb advancement.        TODAY'S TREATMENT:  Pt seen for aquatic therapy today.  Treatment took place in water 3.25-4.8 ft in depth at the [facility] pool. Temp of water was 91.  Pt entered/exited the pool via stairs (step through pattern) independently with bilat rail.   Walking forwad, back and side stepping without UE support, 2-3 laps of each at 4 ft depth Holding wall: side to side lunge, hamstring curls Holding yellow hand buoys: forward walking lunges; SLS with toe taps front,side, back; SLS with 3 way leg kick squats x 15;  heel raises x 10  L forward, lateral and retro step ups with single rail, cues for slow controlled motion. High knee marching forward  Forward walking kicks (ie: soccer ball), cues for full knee ext  Leg press with thin sqoodle x 10 each leg Stretching L quad with Lt foot on squoodle Stretching Lt hamstring, ITB, and adductor with thin squoodle supporting LE, back against wall.  Once pt dried off:  I strips of reg Rock tape applied to Lt lateral knee at joint line, crossing at tibial tuberosity with 25% to decompress tissue and increase proprioception.   Pt requires buoyancy for support and to offload joints with strengthening exercises. Viscosity of the water is needed for resistance of strengthening; water current perturbations provides challenge to standing balance unsupported, requiring increased core activation.  PATIENT EDUCATION:  Education details: Continue pain control and normalization of gait,  Person educated: Patient and Caregiver Education method: Explanation, Demonstration, and Handouts Education comprehension: verbalized understanding and returned  demonstration     HOME EXERCISE PROGRAM: Access Code: [email or web address] URL: [email or web address] Date: 03/16/2022 Prepared by: Ruben Im   Exercises - Seated Heel Slide  - 3 x daily - 7 x  weekly - 1 sets - 20 reps - Hip Flexor Stretch on Step  - 3 x daily - 7 x weekly - 1 sets - 20 reps - Backward Weight Shift and Opposite Arm Raise with Walker  - 3 x daily - 7 x weekly - 1 sets - 20 reps  04/02/22: Seated Hamstring Stretch  - 3 x daily - 7 x weekly - 5 reps - 10 hold   ASSESSMENT:   CLINICAL IMPRESSION: Hamstrings bilat remain tight; observe pt with difficulty with full knee ext with LAQ.  Encouraged continued work on hamstring stretches outside of therapy sessions.  Positive response to kinesiology tape; reapplied today at end of session.  Improved tolerance for stairs.  Progressing towards remaining goals.    OBJECTIVE IMPAIRMENTS decreased activity tolerance, decreased mobility, difficulty walking, decreased ROM, decreased strength, increased edema, impaired perceived functional ability, and pain.    ACTIVITY LIMITATIONS community activity and driving.    PERSONAL FACTORS Language barrier and other affected body regions  affect patient's functional outcome.      REHAB POTENTIAL: Good   CLINICAL DECISION MAKING: Stable/uncomplicated   EVALUATION COMPLEXITY: Low     GOALS: Goals reviewed with patient? Yes   SHORT TERM GOALS: Target date: 04/13/2022   The patient will demonstrate knowledge of basic self care strategies and exercises to promote healing   Baseline: Goal status: MET   2.  The patient will have improved knee ROM 10-90 degrees for improved ability to get in/out of the car Baseline:  Goal status: MET   3.  The patient will be able to walk household distances with a cane Baseline:  Goal status: MET   4.  The patient will have motor control/strength to maneuver LE in/out of bed without UE assist  Baseline:  Goal status: MET     LONG TERM  GOALS: Target date: 05/11/2022   The patient will be independent in a safe self progression of a home exercise program to promote further recovery of function   Baseline:  Goal status: MET 05/24/2022    2.  The patient will have improved knee ROM 6-120 degrees needed to ascend/descend steps at home Baseline:  Goal status: NOT MET   3.  The patient will have improved gait stamina and speed needed to ambulate >300 feet with cane Baseline:  Goal status: MET    4.  The patient will have improved LE strength to at least 4/5 needed for standing to cook and future return to fly fishing Baseline:  Goal status: MET   5.  FOTO score improved to 77% indicating improved function with less pain Baseline:  Goal status: NOT MET  6. Pt to walk 500' with to  without cane with minimal limping or compensations in gait mechanics for improved gait pattern.   Baseline:  Goal status: Achieved 06/18/22   PLAN: PT FREQUENCY: 2x/week   PT DURATION: 8 weeks up to 12 visits   PLANNED INTERVENTIONS: Therapeutic exercises, Therapeutic activity, Neuromuscular re-education, Balance training, Gait training, Patient/Family education, Joint mobilization, Stair training, Aquatic Therapy, Dry Needling, Electrical stimulation, Cryotherapy, Taping, Vasopneumatic device, Ionotophoresis 4mg /ml Dexamethasone, and Manual therapy   PLAN FOR NEXT SESSION: aquatics for mobility and decreased pain; teach family how to tape knee. Add quad stretch to HEP (prone with strap or foot on chair).   Kerin Perna, PTA 06/22/22 10:14 AM

## 2022-06-24 ENCOUNTER — Encounter (HOSPITAL_BASED_OUTPATIENT_CLINIC_OR_DEPARTMENT_OTHER): Payer: Self-pay | Admitting: Physical Therapy

## 2022-06-24 ENCOUNTER — Ambulatory Visit (HOSPITAL_BASED_OUTPATIENT_CLINIC_OR_DEPARTMENT_OTHER): Payer: Medicare Other | Admitting: Physical Therapy

## 2022-06-24 DIAGNOSIS — M25562 Pain in left knee: Secondary | ICD-10-CM | POA: Diagnosis not present

## 2022-06-24 DIAGNOSIS — R262 Difficulty in walking, not elsewhere classified: Secondary | ICD-10-CM

## 2022-06-24 DIAGNOSIS — M6281 Muscle weakness (generalized): Secondary | ICD-10-CM

## 2022-06-24 NOTE — Therapy (Signed)
OUTPATIENT PHYSICAL THERAPY TREATMENT NOTE   Patient Name: Andrew [name] MRN: [id number] TUU:[birth year], 71 y.o., male Today's Date: 06/24/2022  PCP: Bernerd Limbo, MD REFERRING PROVIDER: Bernerd Limbo, MD  END OF SESSION:   PT End of Session - 06/24/22 0908     Visit Number 19    Number of Visits 24    Date for PT Re-Evaluation 08/24/22    Authorization Type Medicare    PT Start Time 0906    PT Stop Time 0945    PT Time Calculation (min) 39 min    Activity Tolerance Patient tolerated treatment well    Behavior During Therapy [facility] for tasks assessed/performed                Past Medical History:  Diagnosis Date   DVT (deep venous thrombosis) ([place])    right leg in 2011; prior injury to right foot   Echocardiogram 07/2021   Echocardiogram 8/22: EF 60-65, no RWMA, mild LVH, low normal RVSF, trivial AI, mild AV sclerosis w/o AS   HLD (hyperlipidemia)    Right foot injury    Past Surgical History:  Procedure Laterality Date   NO PAST SURGERIES     Patient Active Problem List   Diagnosis Date Noted   Left patella fracture 05/27/2022   Acute bronchitis 02/18/2022   Loss of transverse plantar arch of right foot 12/17/2021   COVID-19 long hauler manifesting chronic cough 10/28/2021   History of COVID-19 10/28/2021   Effusion of left knee 09/09/2021   History of deep vein thrombosis (DVT) of lower extremity 05/13/2021   Degenerative cervical disc 01/16/2021   Right shoulder pain 01/16/2021   Degenerative arthritis of knee 05/28/2020   Spondylosis without myelopathy or radiculopathy, lumbar region 10/11/2016   Migraines 06/08/2016   Nuclear sclerotic cataract of both eyes 06/08/2016   Presbyopia 06/08/2016   Hypermetropia of both eyes 06/08/2016   Long term current use of anticoagulant 06/02/2016   Recurrent right inguinal hernia 12/05/2014   Chronic venous insufficiency 11/28/2014   Bradycardia 10/11/2012   Right leg DVT ([place]) 10/03/2012   HLD (hyperlipidemia)  07/25/2012   REFERRING DIAG: J17.915 (ICD-10-CM) - Presence of left artificial knee joint   THERAPY DIAG:  Acute pain of left knee   Stiffness of left knee, not elsewhere classified   Muscle weakness (generalized)   PERTINENT HISTORY: See above   PRECAUTIONS: none   SUBJECTIVE:  Pt reports worked outside for a couple hours yesterday then went for a 3 mile walk.  Was hurting this am.   PAIN:  Are you having pain? YES Pain location: left knee  Pain description: 5/10 Aggravating factors: night/sleeping, stairs Relieving factors: medication, rest   OBJECTIVE:    DIAGNOSTIC FINDINGS: OA   PATIENT SURVEYS:  FOTO 44% 05/24/2022: FOTO44%    COGNITION:           Overall cognitive status: Within functional limits for tasks assessed                            POSTURE:  Flexed knee in supine unable to fully extend   PALPATION: Purple bruising medial left thigh; dressing over surgical incision; moderate edema apparent    LE ROM:   Active ROM Right 4/4 Left 4/4 Left  03/22/22 Left 04/05/22 LEFT 05/24/2022   Hip flexion          Hip extension          Hip abduction  Hip adduction          Hip internal rotation          Hip external rotation          Knee flexion 130 54 70 103 112  Knee extension 0 -25    -9  Ankle dorsiflexion          Ankle plantarflexion          Ankle inversion          Ankle eversion           (Blank rows = not tested)   LE MMT:     MMT Right 4/4 Left 4/4 Left 05/24/2022   Hip flexion 5 2 4/5  Hip extension       Hip abduction 5 3 4/5  Hip adduction       Hip internal rotation       Hip external rotation       Knee flexion 5 2 4/5  Knee extension 5 2 4/5  Ankle dorsiflexion       Ankle plantarflexion       Ankle inversion       Ankle eversion        (Blank rows = not tested)     FUNCTIONAL TESTS:  Needs assist to lift leg on/off table  05/24/2022 I with ADLs and mobility but does have pain   GAIT: Distance walked:  300' Assistive device utilized: no AD Level of assistance: Modified independence Comments: noted left knee decreased extension with stance phase, Rt lateral trunk lean with Lt leg limb advancement.        TODAY'S TREATMENT:  Pt seen for aquatic therapy today.  Treatment took place in water 3.25-4.8 ft in depth at the [facility] pool. Temp of water was 91.  Pt entered/exited the pool via stairs (step through pattern) independently with bilat rail.   Walking forwad, back and side stepping without UE support, 2-3 laps of each at 4 ft depth Holding wall:hamstring curls; add/abd; hip extension x 10-12 Holding yellow hand buoys:SLS with toe taps front,side, back; hip openers x12 squats x 15;  heel raises x 10 on bottom of pool:  repeated on bottom step without UE support monster walk x 4 widths Forward walking kicks (ie: soccer ball), cues for full knee ext  Leg press with thin sqoodle repeated in hip external rotation x 12 each leg.  UE supported by hand buoys Stretching L quad with Lt foot on squoodle Stretching Lt hamstring, gastroc, quad on step   PATIENT EDUCATION:  Education details: Continue pain control and normalization of gait,  Person educated: Patient and Caregiver Education method: Explanation, Demonstration, and Handouts Education comprehension: verbalized understanding and returned demonstration     HOME EXERCISE PROGRAM: Access Code: [email or web address] URL: [email or web address] Date: 03/16/2022 Prepared by: Ruben Im   Exercises - Seated Heel Slide  - 3 x daily - 7 x weekly - 1 sets - 20 reps - Hip Flexor Stretch on Step  - 3 x daily - 7 x weekly - 1 sets - 20 reps - Backward Weight Shift and Opposite Arm Raise with Walker  - 3 x daily - 7 x weekly - 1 sets - 20 reps  04/02/22: Seated Hamstring Stretch  - 3 x daily - 7 x weekly - 5 reps - 10 hold   ASSESSMENT:   CLINICAL IMPRESSION: Pt with increased pain sx after overdoing yesterday.   Modified session to settle sx. Focused on stretching  and exercises. No added discomfort with lateral movements. Progressing towards remaining goals.    OBJECTIVE IMPAIRMENTS decreased activity tolerance, decreased mobility, difficulty walking, decreased ROM, decreased strength, increased edema, impaired perceived functional ability, and pain.    ACTIVITY LIMITATIONS community activity and driving.    PERSONAL FACTORS Language barrier and other affected body regions  affect patient's functional outcome.      REHAB POTENTIAL: Good   CLINICAL DECISION MAKING: Stable/uncomplicated   EVALUATION COMPLEXITY: Low     GOALS: Goals reviewed with patient? Yes   SHORT TERM GOALS: Target date: 04/13/2022   The patient will demonstrate knowledge of basic self care strategies and exercises to promote healing   Baseline: Goal status: MET   2.  The patient will have improved knee ROM 10-90 degrees for improved ability to get in/out of the car Baseline:  Goal status: MET   3.  The patient will be able to walk household distances with a cane Baseline:  Goal status: MET   4.  The patient will have motor control/strength to maneuver LE in/out of bed without UE assist  Baseline:  Goal status: MET     LONG TERM GOALS: Target date: 05/11/2022   The patient will be independent in a safe self progression of a home exercise program to promote further recovery of function   Baseline:  Goal status: MET 05/24/2022    2.  The patient will have improved knee ROM 6-120 degrees needed to ascend/descend steps at home Baseline:  Goal status: NOT MET   3.  The patient will have improved gait stamina and speed needed to ambulate >300 feet with cane Baseline:  Goal status: MET    4.  The patient will have improved LE strength to at least 4/5 needed for standing to cook and future return to fly fishing Baseline:  Goal status: MET   5.  FOTO score improved to 77% indicating improved function with less  pain Baseline:  Goal status: NOT MET  6. Pt to walk 500' with to  without cane with minimal limping or compensations in gait mechanics for improved gait pattern.   Baseline:  Goal status: Achieved 06/18/22   PLAN: PT FREQUENCY: 2x/week   PT DURATION: 8 weeks up to 12 visits   PLANNED INTERVENTIONS: Therapeutic exercises, Therapeutic activity, Neuromuscular re-education, Balance training, Gait training, Patient/Family education, Joint mobilization, Stair training, Aquatic Therapy, Dry Needling, Electrical stimulation, Cryotherapy, Taping, Vasopneumatic device, Ionotophoresis 26m/ml Dexamethasone, and Manual therapy   PLAN FOR NEXT SESSION: aquatics for mobility and decreased pain; teach family how to tape knee. Add quad stretch to HEP (prone with strap or foot on chair).   MStanton Kidney(Tharon Aquas Lillyana Majette MPT 06/24/22 1:18 PM

## 2022-06-27 ENCOUNTER — Other Ambulatory Visit: Payer: Self-pay | Admitting: Pulmonary Disease

## 2022-06-28 ENCOUNTER — Encounter (HOSPITAL_BASED_OUTPATIENT_CLINIC_OR_DEPARTMENT_OTHER): Payer: Self-pay | Admitting: Physical Therapy

## 2022-06-28 ENCOUNTER — Ambulatory Visit (HOSPITAL_BASED_OUTPATIENT_CLINIC_OR_DEPARTMENT_OTHER): Payer: Medicare Other | Admitting: Physical Therapy

## 2022-06-28 DIAGNOSIS — R262 Difficulty in walking, not elsewhere classified: Secondary | ICD-10-CM

## 2022-06-28 DIAGNOSIS — M25562 Pain in left knee: Secondary | ICD-10-CM | POA: Diagnosis not present

## 2022-06-28 DIAGNOSIS — M6281 Muscle weakness (generalized): Secondary | ICD-10-CM

## 2022-06-28 NOTE — Therapy (Signed)
OUTPATIENT PHYSICAL THERAPY TREATMENT NOTE   Patient Name: Andrew [name] MRN: [id number] DOB:07/02/1951, 71 y.o., male Today's Date: 06/28/2022  PCP: Tracey Harries, MD REFERRING PROVIDER: Tracey Harries, MD  END OF SESSION:   PT End of Session - 06/28/22 1334     Visit Number 20    Number of Visits 24    Date for PT Re-Evaluation 08/24/22    Authorization Type Medicare    PT Start Time 1031    PT Stop Time 1115    PT Time Calculation (min) 44 min    Activity Tolerance Patient tolerated treatment well    Behavior During Therapy [facility] for tasks assessed/performed                 Past Medical History:  Diagnosis Date   DVT (deep venous thrombosis) (HCC)    right leg in 2011; prior injury to right foot   Echocardiogram 07/2021   Echocardiogram 8/22: EF 60-65, no RWMA, mild LVH, low normal RVSF, trivial AI, mild AV sclerosis w/o AS   HLD (hyperlipidemia)    Right foot injury    Past Surgical History:  Procedure Laterality Date   NO PAST SURGERIES     Patient Active Problem List   Diagnosis Date Noted   Left patella fracture 05/27/2022   Acute bronchitis 02/18/2022   Loss of transverse plantar arch of right foot 12/17/2021   COVID-19 long hauler manifesting chronic cough 10/28/2021   History of COVID-19 10/28/2021   Effusion of left knee 09/09/2021   History of deep vein thrombosis (DVT) of lower extremity 05/13/2021   Degenerative cervical disc 01/16/2021   Right shoulder pain 01/16/2021   Degenerative arthritis of knee 05/28/2020   Spondylosis without myelopathy or radiculopathy, lumbar region 10/11/2016   Migraines 06/08/2016   Nuclear sclerotic cataract of both eyes 06/08/2016   Presbyopia 06/08/2016   Hypermetropia of both eyes 06/08/2016   Long term current use of anticoagulant 06/02/2016   Recurrent right inguinal hernia 12/05/2014   Chronic venous insufficiency 11/28/2014   Bradycardia 10/11/2012   Right leg DVT (HCC) 10/03/2012   HLD  (hyperlipidemia) 07/25/2012   REFERRING DIAG: W26.828 (ICD-10-CM) - Presence of left artificial knee joint   THERAPY DIAG:  Acute pain of left knee   Stiffness of left knee, not elsewhere classified   Muscle weakness (generalized)   PERTINENT HISTORY: See above   PRECAUTIONS: none   SUBJECTIVE:  "Pain was worse yesterday than it is today".   PAIN:  Are you having pain? YES Pain location: left knee  Pain description: 4/10 Aggravating factors: night/sleeping, stairs Relieving factors: medication, rest   OBJECTIVE:    DIAGNOSTIC FINDINGS: OA   PATIENT SURVEYS:  FOTO 44% 05/24/2022: FOTO44%    COGNITION:           Overall cognitive status: Within functional limits for tasks assessed                            POSTURE:  Flexed knee in supine unable to fully extend   PALPATION: Purple bruising medial left thigh; dressing over surgical incision; moderate edema apparent    LE ROM:   Active ROM Right 4/4 Left 4/4 Left  03/22/22 Left 04/05/22 LEFT 05/24/2022   Hip flexion          Hip extension          Hip abduction          Hip adduction  Hip internal rotation          Hip external rotation          Knee flexion 130 54 70 103 112  Knee extension 0 -25    -9  Ankle dorsiflexion          Ankle plantarflexion          Ankle inversion          Ankle eversion           (Blank rows = not tested)   LE MMT:     MMT Right 4/4 Left 4/4 Left 05/24/2022   Hip flexion 5 2 4/5  Hip extension       Hip abduction 5 3 4/5  Hip adduction       Hip internal rotation       Hip external rotation       Knee flexion 5 2 4/5  Knee extension 5 2 4/5  Ankle dorsiflexion       Ankle plantarflexion       Ankle inversion       Ankle eversion        (Blank rows = not tested)     FUNCTIONAL TESTS:  Needs assist to lift leg on/off table  05/24/2022 I with ADLs and mobility but does have pain   GAIT: Distance walked: 300' Assistive device utilized: no AD Level of  assistance: Modified independence Comments: noted left knee decreased extension with stance phase, Rt lateral trunk lean with Lt leg limb advancement.        TODAY'S TREATMENT:  Pt seen for aquatic therapy today.  Treatment took place in water 3.25-4.8 ft in depth at the [facility] pool. Temp of water was 91.  Pt entered/exited the pool via stairs (step through pattern) independently with bilat rail.  Donned 2.5 lb ankle weights throughout majority of session. Walking forwad, back and side stepping without UE support, 2-3 laps of each at 4 ft depth Forward walking kicks (ie: soccer ball), cues for full knee ext  x 4 widths Step ups; SL lateral lle; forward 1 step x 10 then 2nd step x5 R/L; backward 1st step R/L x10 Supported by 1 foam hand buoysl:hamstring curls; Marching; add/abd; hip flex; hip flex-extension x 10-12 Holding yellow hand buoys:SLS with toe taps front,side, back; hip openers x12 squats x 15;  heel raises x 10 on bottom of pool:  repeated on bottom step without UE support monster walk x 4 widths Stretching Lt hamstring, gastroc, quad on step Side lunges with 1 foam hand buoy x 4 widths Seated on 4th step: addabd 3x 20; flutter 3x20.  Cues for knee extension throughout. Squat bottom step x 10 SLS on left multiple tries able to hold best 12s  Walking forward between exercises for recovery     PATIENT EDUCATION:  Education details: Continue pain control and normalization of gait,  Person educated: Patient and Caregiver Education method: Explanation, Demonstration, and Handouts Education comprehension: verbalized understanding and returned demonstration     HOME EXERCISE PROGRAM: Access Code: [email or web address] URL: [email or web address] Date: 03/16/2022 Prepared by: Ruben Im   Exercises - Seated Heel Slide  - 3 x daily - 7 x weekly - 1 sets - 20 reps - Hip Flexor Stretch on Step  - 3 x daily - 7 x weekly - 1 sets - 20 reps - Backward Weight  Shift and Opposite Arm Raise with Walker  - 3 x daily - 7 x weekly - 1  sets - 20 reps  04/02/22: Seated Hamstring Stretch  - 3 x daily - 7 x weekly - 5 reps - 10 hold   ASSESSMENT:   CLINICAL IMPRESSION: Pt with good toleration to added resistance of 2.5 lb ankle weight to exercises. Also increased challenge with step ups completed over 2 steps. Some difficulty due to weakness with LLE concentric and eccentric control from that added height. Despite stretching at therapy and at home pt continues to be short of full L knee extention. Overall pain has decreased     OBJECTIVE IMPAIRMENTS decreased activity tolerance, decreased mobility, difficulty walking, decreased ROM, decreased strength, increased edema, impaired perceived functional ability, and pain.    ACTIVITY LIMITATIONS community activity and driving.    PERSONAL FACTORS Language barrier and other affected body regions  affect patient's functional outcome.      REHAB POTENTIAL: Good   CLINICAL DECISION MAKING: Stable/uncomplicated   EVALUATION COMPLEXITY: Low     GOALS: Goals reviewed with patient? Yes   SHORT TERM GOALS: Target date: 04/13/2022   The patient will demonstrate knowledge of basic self care strategies and exercises to promote healing   Baseline: Goal status: MET   2.  The patient will have improved knee ROM 10-90 degrees for improved ability to get in/out of the car Baseline:  Goal status: MET   3.  The patient will be able to walk household distances with a cane Baseline:  Goal status: MET   4.  The patient will have motor control/strength to maneuver LE in/out of bed without UE assist  Baseline:  Goal status: MET     LONG TERM GOALS: Target date: 05/11/2022   The patient will be independent in a safe self progression of a home exercise program to promote further recovery of function   Baseline:  Goal status: MET 05/24/2022    2.  The patient will have improved knee ROM 6-120 degrees needed to  ascend/descend steps at home Baseline:  Goal status: NOT MET   3.  The patient will have improved gait stamina and speed needed to ambulate >300 feet with cane Baseline:  Goal status: MET    4.  The patient will have improved LE strength to at least 4/5 needed for standing to cook and future return to fly fishing Baseline:  Goal status: MET   5.  FOTO score improved to 77% indicating improved function with less pain Baseline:  Goal status: NOT MET  6. Pt to walk 500' with to  without cane with minimal limping or compensations in gait mechanics for improved gait pattern.   Baseline:  Goal status: Achieved 06/18/22   PLAN: PT FREQUENCY: 2x/week   PT DURATION: 8 weeks up to 12 visits   PLANNED INTERVENTIONS: Therapeutic exercises, Therapeutic activity, Neuromuscular re-education, Balance training, Gait training, Patient/Family education, Joint mobilization, Stair training, Aquatic Therapy, Dry Needling, Electrical stimulation, Cryotherapy, Taping, Vasopneumatic device, Ionotophoresis 4mg /ml Dexamethasone, and Manual therapy   PLAN FOR NEXT SESSION: aquatics for mobility and decreased pain;   Stanton Kidney Tharon Aquas) Willer [place]orno MPT 06/28/22 1:36 PM

## 2022-07-01 ENCOUNTER — Ambulatory Visit (HOSPITAL_BASED_OUTPATIENT_CLINIC_OR_DEPARTMENT_OTHER): Payer: Medicare Other | Admitting: Physical Therapy

## 2022-07-01 DIAGNOSIS — R262 Difficulty in walking, not elsewhere classified: Secondary | ICD-10-CM

## 2022-07-01 DIAGNOSIS — M6281 Muscle weakness (generalized): Secondary | ICD-10-CM

## 2022-07-01 DIAGNOSIS — M25562 Pain in left knee: Secondary | ICD-10-CM | POA: Diagnosis not present

## 2022-07-01 NOTE — Therapy (Addendum)
OUTPATIENT PHYSICAL THERAPY TREATMENT NOTE   Patient Name: Andrew [name] MRN: [id number] DOB:Sep 18, 1951, 71 y.o., male Today's Date: 07/01/2022  PCP: Tracey Harries, MD REFERRING PROVIDER: Tracey Harries, MD  END OF SESSION:   PT End of Session - 07/01/22 1118     Visit Number 21    Number of Visits 24    Date for PT Re-Evaluation 08/24/22    Authorization Type Medicare    PT Start Time 1031    PT Stop Time 1115    PT Time Calculation (min) 44 min    Activity Tolerance Patient tolerated treatment well    Behavior During Therapy [facility] for tasks assessed/performed                  Past Medical History:  Diagnosis Date   DVT (deep venous thrombosis) (HCC)    right leg in 2011; prior injury to right foot   Echocardiogram 07/2021   Echocardiogram 8/22: EF 60-65, no RWMA, mild LVH, low normal RVSF, trivial AI, mild AV sclerosis w/o AS   HLD (hyperlipidemia)    Right foot injury    Past Surgical History:  Procedure Laterality Date   NO PAST SURGERIES     Patient Active Problem List   Diagnosis Date Noted   Left patella fracture 05/27/2022   Acute bronchitis 02/18/2022   Loss of transverse plantar arch of right foot 12/17/2021   COVID-19 long hauler manifesting chronic cough 10/28/2021   History of COVID-19 10/28/2021   Effusion of left knee 09/09/2021   History of deep vein thrombosis (DVT) of lower extremity 05/13/2021   Degenerative cervical disc 01/16/2021   Right shoulder pain 01/16/2021   Degenerative arthritis of knee 05/28/2020   Spondylosis without myelopathy or radiculopathy, lumbar region 10/11/2016   Migraines 06/08/2016   Nuclear sclerotic cataract of both eyes 06/08/2016   Presbyopia 06/08/2016   Hypermetropia of both eyes 06/08/2016   Long term current use of anticoagulant 06/02/2016   Recurrent right inguinal hernia 12/05/2014   Chronic venous insufficiency 11/28/2014   Bradycardia 10/11/2012   Right leg DVT (HCC) 10/03/2012   HLD  (hyperlipidemia) 07/25/2012   REFERRING DIAG: W78.436 (ICD-10-CM) - Presence of left artificial knee joint   THERAPY DIAG:  Acute pain of left knee   Stiffness of left knee, not elsewhere classified   Muscle weakness (generalized)   PERTINENT HISTORY: See above   PRECAUTIONS: none   SUBJECTIVE:  "Had a pain in my left shin th morning had it last night as well comes and goes".   PAIN:  Are you having pain? YES Pain location: left knee  Pain description: 2/10 Aggravating factors: night/sleeping, stairs Relieving factors: medication, rest   OBJECTIVE:    DIAGNOSTIC FINDINGS: OA   PATIENT SURVEYS:  FOTO 44% 05/24/2022: FOTO44%    COGNITION:           Overall cognitive status: Within functional limits for tasks assessed                            POSTURE:  Flexed knee in supine unable to fully extend   PALPATION: Purple bruising medial left thigh; dressing over surgical incision; moderate edema apparent    LE ROM:   Active ROM Right 4/4 Left 4/4 Left  03/22/22 Left 04/05/22 LEFT 05/24/2022   Hip flexion          Hip extension          Hip abduction  Hip adduction          Hip internal rotation          Hip external rotation          Knee flexion 130 54 70 103 112  Knee extension 0 -25    -9  Ankle dorsiflexion          Ankle plantarflexion          Ankle inversion          Ankle eversion           (Blank rows = not tested)   LE MMT:     MMT Right 4/4 Left 4/4 Left 05/24/2022   Hip flexion 5 2 4/5  Hip extension       Hip abduction 5 3 4/5  Hip adduction       Hip internal rotation       Hip external rotation       Knee flexion 5 2 4/5  Knee extension 5 2 4/5  Ankle dorsiflexion       Ankle plantarflexion       Ankle inversion       Ankle eversion        (Blank rows = not tested)     FUNCTIONAL TESTS:  Needs assist to lift leg on/off table  05/24/2022 I with ADLs and mobility but does have pain   GAIT: Distance walked:  300' Assistive device utilized: no AD Level of assistance: Modified independence Comments: noted left knee decreased extension with stance phase, Rt lateral trunk lean with Lt leg limb advancement.        TODAY'S TREATMENT:  Pt seen for aquatic therapy today.  Treatment took place in water 3.25-4.8 ft in depth at the [facility] pool. Temp of water was 91.  Pt entered/exited the pool via stairs (step through pattern) independently with bilat rail.  Donned 2.5 lb ankle weights throughout majority of session. Walking forwad, back and side stepping without UE support, 2-3 laps of each at 4 ft depth Forward walking kicks (ie: soccer ball), cues for full knee ext  x 4 widths Monster walk x 2 width; hurdles x 1 wisth (hurdles increase lateral joint line pain) Step ups; SL lateral lle; forward 1 step x 10 then 2nd step x5 R/L; backward 1st step R/L x10 Supported by tick squoodle:hamstring curls; Marching; add/abd; hip flex; hip flex-extension; SL squat left x 10-12 Stretching Lt hamstring, gastroc, quad on step Side lunges with 2 foam hand buoy x 4 widths Seated on 4th step: addabd 3x 20; flutter 3x20.  Cues for knee extension throughout. Squat bottom step x 10 SLS L/R ue yellow hand buoy holding > 15 s; without  best 5s Warrior III using heavy squoodle  Walking forward between exercises for recovery     PATIENT EDUCATION:  Education details: Continue pain control and normalization of gait,  Person educated: Patient and Caregiver Education method: Explanation, Media planner, and Handouts Education comprehension: verbalized understanding and returned demonstration     HOME EXERCISE PROGRAM: Access Code: [email or web address] URL: [email or web address] Date: 03/16/2022 Prepared by: Ruben Im   Exercises - Seated Heel Slide  - 3 x daily - 7 x weekly - 1 sets - 20 reps - Hip Flexor Stretch on Step  - 3 x daily - 7 x weekly - 1 sets - 20 reps - Backward Weight Shift and  Opposite Arm Raise with Walker  - 3 x daily - 7 x weekly - 1 sets -  20 reps  04/02/22: Seated Hamstring Stretch  - 3 x daily - 7 x weekly - 5 reps - 10 hold   ASSESSMENT:   CLINICAL IMPRESSION: Pt with difficulty completing back step ups gaining TKE while maintaining balance.  Moderate cues for positioning and holding to handrail improved. Tolerating monster walking (clockwise le rotation) but not hurdles (opposite). Shin pain may be related to muscle cramps.  He does experience left gastoc cramp towards end of session.  Pt encouraged to stay hydrated. Overall pain has decreased reporting only 2/10 this am. He continues with his meditation daily which controls his pain (as per pt). He is not yet gaining full left knee extension but is reaching heel strike (not full) with each step. Progressing with strength and balance very well.     OBJECTIVE IMPAIRMENTS decreased activity tolerance, decreased mobility, difficulty walking, decreased ROM, decreased strength, increased edema, impaired perceived functional ability, and pain.    ACTIVITY LIMITATIONS community activity and driving.    PERSONAL FACTORS Language barrier and other affected body regions  affect patient's functional outcome.      REHAB POTENTIAL: Good   CLINICAL DECISION MAKING: Stable/uncomplicated   EVALUATION COMPLEXITY: Low     GOALS: Goals reviewed with patient? Yes   SHORT TERM GOALS: Target date: 04/13/2022   The patient will demonstrate knowledge of basic self care strategies and exercises to promote healing   Baseline: Goal status: MET   2.  The patient will have improved knee ROM 10-90 degrees for improved ability to get in/out of the car Baseline:  Goal status: MET   3.  The patient will be able to walk household distances with a cane Baseline:  Goal status: MET   4.  The patient will have motor control/strength to maneuver LE in/out of bed without UE assist  Baseline:  Goal status: MET     LONG TERM  GOALS: Target date: 05/11/2022   The patient will be independent in a safe self progression of a home exercise program to promote further recovery of function   Baseline:  Goal status: MET 05/24/2022    2.  The patient will have improved knee ROM 6-120 degrees needed to ascend/descend steps at home Baseline:  Goal status: NOT MET   3.  The patient will have improved gait stamina and speed needed to ambulate >300 feet with cane Baseline:  Goal status: MET    4.  The patient will have improved LE strength to at least 4/5 needed for standing to cook and future return to fly fishing Baseline:  Goal status: MET   5.  FOTO score improved to 77% indicating improved function with less pain Baseline:  Goal status: NOT MET  6. Pt to walk 500' with to  without cane with minimal limping or compensations in gait mechanics for improved gait pattern.   Baseline:  Goal status: Achieved 06/18/22   PLAN: PT FREQUENCY: 2x/week   PT DURATION: 8 weeks up to 12 visits   PLANNED INTERVENTIONS: Therapeutic exercises, Therapeutic activity, Neuromuscular re-education, Balance training, Gait training, Patient/Family education, Joint mobilization, Stair training, Aquatic Therapy, Dry Needling, Electrical stimulation, Cryotherapy, Taping, Vasopneumatic device, Ionotophoresis 4mg /ml Dexamethasone, and Manual therapy   PLAN FOR NEXT SESSION: aquatics for mobility and decreased pain;   Stelios Kirby Tharon Aquas) Hiedi [place] MPT 07/01/22 1:00 PM Addended  07/01/22 Annamarie Major) Jovany Disano MPT 102p

## 2022-07-02 ENCOUNTER — Encounter: Payer: Self-pay | Admitting: Nurse Practitioner

## 2022-07-02 ENCOUNTER — Other Ambulatory Visit: Payer: Self-pay | Admitting: Family Medicine

## 2022-07-04 ENCOUNTER — Other Ambulatory Visit: Payer: Self-pay | Admitting: Family Medicine

## 2022-07-05 ENCOUNTER — Ambulatory Visit: Payer: Medicare Other | Attending: Orthopedic Surgery | Admitting: Physical Therapy

## 2022-07-05 ENCOUNTER — Ambulatory Visit (HOSPITAL_BASED_OUTPATIENT_CLINIC_OR_DEPARTMENT_OTHER): Payer: Medicare Other | Admitting: Physical Therapy

## 2022-07-05 DIAGNOSIS — R262 Difficulty in walking, not elsewhere classified: Secondary | ICD-10-CM | POA: Diagnosis present

## 2022-07-05 DIAGNOSIS — M25562 Pain in left knee: Secondary | ICD-10-CM | POA: Diagnosis present

## 2022-07-05 DIAGNOSIS — M6281 Muscle weakness (generalized): Secondary | ICD-10-CM | POA: Diagnosis present

## 2022-07-05 NOTE — Therapy (Addendum)
OUTPATIENT PHYSICAL THERAPY TREATMENT NOTE   Patient Name: Andrew [name] MRN: [id number] [id number], 71 y.o., male Today's Date: 07/05/2022  PCP: Bernerd Limbo, MD REFERRING PROVIDER: Bernerd Limbo, MD  Progress Note Reporting Period 05/24/22 to 07/05/22  See note below for Objective Data and Assessment of Progress/Goals.      END OF SESSION:   PT End of Session - 07/05/22 1627     Visit Number 22    Number of Visits 24    Date for PT Re-Evaluation 08/24/22    Authorization Type Medicare    PT Start Time 1625   pt arrival time   PT Stop Time 1700    PT Time Calculation (min) 35 min    Activity Tolerance Patient tolerated treatment well    Behavior During Therapy [facility] for tasks assessed/performed                  Past Medical History:  Diagnosis Date   DVT (deep venous thrombosis) ([place])    right leg in 2011; prior injury to right foot   Echocardiogram 07/2021   Echocardiogram 8/22: EF 60-65, no RWMA, mild LVH, low normal RVSF, trivial AI, mild AV sclerosis w/o AS   HLD (hyperlipidemia)    Right foot injury    Past Surgical History:  Procedure Laterality Date   NO PAST SURGERIES     Patient Active Problem List   Diagnosis Date Noted   Left patella fracture 05/27/2022   Acute bronchitis 02/18/2022   Loss of transverse plantar arch of right foot 12/17/2021   COVID-19 long hauler manifesting chronic cough 10/28/2021   History of COVID-19 10/28/2021   Effusion of left knee 09/09/2021   History of deep vein thrombosis (DVT) of lower extremity 05/13/2021   Degenerative cervical disc 01/16/2021   Right shoulder pain 01/16/2021   Degenerative arthritis of knee 05/28/2020   Spondylosis without myelopathy or radiculopathy, lumbar region 10/11/2016   Migraines 06/08/2016   Nuclear sclerotic cataract of both eyes 06/08/2016   Presbyopia 06/08/2016   Hypermetropia of both eyes 06/08/2016   Long term current use of anticoagulant 06/02/2016   Recurrent right  inguinal hernia 12/05/2014   Chronic venous insufficiency 11/28/2014   Bradycardia 10/11/2012   Right leg DVT ([place]) 10/03/2012   HLD (hyperlipidemia) 07/25/2012   REFERRING DIAG: N30.051 (ICD-10-CM) - Presence of left artificial knee joint   THERAPY DIAG:  Acute pain of left knee   Stiffness of left knee, not elsewhere classified   Muscle weakness (generalized)   PERTINENT HISTORY: See above   PRECAUTIONS: none   SUBJECTIVE:  Pt reports he has been having pain since Thursday at pool  but better today. Has been doing much better overall since pool therapy.   PAIN:  Are you having pain? YES Pain location: left knee  Pain description: 2/10 Aggravating factors: night/sleeping, stairs Relieving factors: medication, rest   OBJECTIVE:    DIAGNOSTIC FINDINGS: OA   PATIENT SURVEYS:  FOTO 44% 05/24/2022: FOTO44%  07/05/22: FOTO 66%   COGNITION:           Overall cognitive status: Within functional limits for tasks assessed                            POSTURE:  Flexed knee in supine unable to fully extend   PALPATION: Purple bruising medial left thigh; dressing over surgical incision; moderate edema apparent    LE ROM:   Active ROM Right 4/4 Left  4/4 Left  03/22/22 Left 04/05/22 LEFT 05/24/2022  LEFT 07/05/2022   Hip flexion           Hip extension           Hip abduction           Hip adduction           Hip internal rotation           Hip external rotation           Knee flexion 130 54 70 103 112 120  Knee extension 0 -25    -9 -6  Ankle dorsiflexion           Ankle plantarflexion           Ankle inversion           Ankle eversion            (Blank rows = not tested)   LE MMT:     MMT Right 4/4 Left 4/4 Left 05/24/2022  LEFT 07/05/2022   Hip flexion 5 2 4/5   Hip extension        Hip abduction 5 3 4/5   Hip adduction        Hip internal rotation        Hip external rotation        Knee flexion 5 2 4/5   Knee extension 5 2 4/5   Ankle dorsiflexion         Ankle plantarflexion        Ankle inversion        Ankle eversion         (Blank rows = not tested)     FUNCTIONAL TESTS:  Needs assist to lift leg on/off table  05/24/2022 I with ADLs and mobility but does have pain   GAIT: Distance walked: 300' Assistive device utilized: no AD Level of assistance: Modified independence Comments: noted left knee decreased extension with stance phase, Rt lateral trunk lean with Lt leg limb advancement.        TODAY'S TREATMENT:  07/05/2022: Pt presents for reassessment at Lt knee status post TKA and then pt had set back with patella fracture requiring additional visits. PT reassessed knee ROM and strength both have improved (findings above) and pt has had better mobility tolerance due to improve pain management. However pt reports he does still struggle with increasing his activity and intermittent pain and swelling. Pt also demonstrated tightness at Lt hip adductor, hamstring, and abductor HEP updated with stretches for this.  X10 heel slides and knee extension, quad sets, and sit to stands Manual at Lt quad for improved knee extension Hip stretching: 3x30s hamstring, adductor and abductor (gentle assist for abductor stretching) Pt and family educated on updated HEP and agreed to decreasing pool frequency to 1x per week for 4 additional weeks to better allow I with aquatic POC, improved knee and hip mobility, and increase tolerance to activity with limited pain. Pt also would like to slowly start returning to gym for light exercises as well. Pt educated to discuss any restrictions with doctor at upcoming appointment.    PATIENT EDUCATION:  Education details: HEP updates, continue aquatic therapy for pain control/increased mobility/increased activity tolerance Person educated: Patient and Caregiver Education method: Explanation, Demonstration, and Handouts Education comprehension: verbalized understanding and returned demonstration     HOME  EXERCISE PROGRAM: Access Code: [email or web address]    ASSESSMENT:   CLINICAL IMPRESSION: Pt reports he has improved a lot  since starting aquatic pool therapy but did have a little setback this past weekend after most recent session with him having increased pain at knee and unable to do as much activity as his normal but this had improved at appt Monday. Pt does continue to wear bil knee compression sleeves and reports this helps with mobility and swelling, does still have intermittent swelling at Lt knee but reports usually ice helps. Pt responded well to manual work and mobility with only -6 knee extension measured at end of session. Pt did demonstrate tightness at Lt hip and updated HEP. Pt and family educated on updated HEP for hip stretches pt completed during session for improved carry over. Pt tolerated well and educated on updated POC and agreeable to this, pt would like to start going to gym for more activity as tolerated, PT educated on gentle mobility first and to discuss any restrictions with weight lifting with MD and upcoming appointment. Pt would benefit from additional PT to further address deficits and promote improved community reintegration, I with aquatic POC and HEP, and improved tolerance to activity with limited pain.       OBJECTIVE IMPAIRMENTS decreased activity tolerance, decreased mobility, difficulty walking, decreased ROM, decreased strength, increased edema, impaired perceived functional ability, and pain.    ACTIVITY LIMITATIONS community activity and driving.    PERSONAL FACTORS Language barrier and other affected body regions  affect patient's functional outcome.      REHAB POTENTIAL: Good   CLINICAL DECISION MAKING: Stable/uncomplicated   EVALUATION COMPLEXITY: Low     GOALS: Goals reviewed with patient? Yes   SHORT TERM GOALS: Target date: 04/13/2022   The patient will demonstrate knowledge of basic self care strategies and exercises to promote healing    Baseline: Goal status: MET   2.  The patient will have improved knee ROM 10-90 degrees for improved ability to get in/out of the car Baseline:  Goal status: MET   3.  The patient will be able to walk household distances with a cane Baseline:  Goal status: MET   4.  The patient will have motor control/strength to maneuver LE in/out of bed without UE assist  Baseline:  Goal status: MET     LONG TERM GOALS: Target date: 05/11/2022   The patient will be independent in a safe self progression of a home exercise program to promote further recovery of function   Baseline:  Goal status: MET 05/24/2022    2.  The patient will have improved knee ROM 6-120 degrees needed to ascend/descend steps at home Baseline:  Goal status: MET   3.  The patient will have improved gait stamina and speed needed to ambulate >300 feet with cane Baseline:  Goal status: MET    4.  The patient will have improved LE strength to at least 4/5 needed for standing to cook and future return to fly fishing Baseline:  Goal status: MET   5.  FOTO score improved to 77% indicating improved function with less pain Baseline:  Goal status: NOT MET (66% FOTO 07/05/2022 )  6. Pt to walk 500' with to  without cane with minimal limping or compensations in gait mechanics for improved gait pattern.   Baseline:  Goal status: Achieved 06/18/22  7. Pt to be I with aquatic POC and HEP without cues.   Goal status: INITIAL    PLAN: PT FREQUENCY: 1x/week   PT DURATION: 1x per week 4 weeks   PLANNED INTERVENTIONS: Therapeutic exercises, Therapeutic activity, Neuromuscular  re-education, Balance training, Gait training, Patient/Family education, Joint mobilization, Stair training, Aquatic Therapy, Dry Needling, Electrical stimulation, Cryotherapy, Taping, Vasopneumatic device, Ionotophoresis 4mg /ml Dexamethasone, and Manual therapy   PLAN FOR NEXT SESSION: aquatics for mobility and decreased pain  Stacy Gardner, PT,  DPT 07/24/235:04 PM    PHYSICAL THERAPY DISCHARGE SUMMARY  Visits from Start of Care: 22  Current functional level related to goals / functional outcomes: Unable to assess as pt is addressing pain medically and unable to proceed with therapy.    Remaining deficits: Unable to assess, pt requesting to cancel appointments and DC until knee pain is further addressed medically.    Education / Equipment: HEP, follow medical recommendations    Patient agrees to discharge. Patient goals were partially met. Patient is being discharged due to  pt needing knee medically assessed with continued pain .  Stacy Gardner, PT, DPT 08/31/234:13 PM

## 2022-07-08 ENCOUNTER — Ambulatory Visit: Payer: Medicare Other | Admitting: Family Medicine

## 2022-07-08 ENCOUNTER — Ambulatory Visit (HOSPITAL_BASED_OUTPATIENT_CLINIC_OR_DEPARTMENT_OTHER): Payer: Medicare Other | Admitting: Physical Therapy

## 2022-07-12 ENCOUNTER — Ambulatory Visit (HOSPITAL_BASED_OUTPATIENT_CLINIC_OR_DEPARTMENT_OTHER): Payer: Medicare Other | Admitting: Physical Therapy

## 2022-07-14 ENCOUNTER — Other Ambulatory Visit: Payer: Self-pay | Admitting: Specialist

## 2022-07-14 ENCOUNTER — Ambulatory Visit (HOSPITAL_BASED_OUTPATIENT_CLINIC_OR_DEPARTMENT_OTHER): Payer: Medicare Other | Admitting: Physical Therapy

## 2022-07-19 ENCOUNTER — Encounter (HOSPITAL_BASED_OUTPATIENT_CLINIC_OR_DEPARTMENT_OTHER): Payer: Self-pay | Admitting: Physical Therapy

## 2022-07-19 ENCOUNTER — Ambulatory Visit (HOSPITAL_BASED_OUTPATIENT_CLINIC_OR_DEPARTMENT_OTHER): Payer: Medicare Other | Admitting: Physical Therapy

## 2022-07-21 ENCOUNTER — Ambulatory Visit (HOSPITAL_BASED_OUTPATIENT_CLINIC_OR_DEPARTMENT_OTHER): Payer: Medicare Other | Admitting: Physical Therapy

## 2022-07-22 NOTE — Progress Notes (Signed)
[place] Phone: [phone] Subjective:   [name], am serving as a scribe for Dr. Hulan Saas.  I'm seeing this patient by the request  of:  Bernerd Limbo, MD  CC: left knee pain   [id number]  05/27/2022 Patient does have an avulsion fracture noted.  On ultrasound today to patient does have significant tearing also noted in the patella tendon.  We discussed potentially making patient somewhat nonweightbearing but continuing to try to do range of motion of the knee with a recent replacement.  Patient does have a edema noted as well from his chronic venous insufficiency that also makes it somewhat difficult.  Patient does have some hypoechoic changes within the knee but this could be just postsurgical anything else.  Do not significant seems to be an allergic reaction.  Some evidence has shown back decrease swelling and after surgery with Singulair short-term so we will give him this as well. With black box warning.  Patient's brother helped translate.  Follow-up with me again in 6 to 8 weeks otherwise and encouraged him to follow-up with the surgeon.  Total time with family 33 minutes  Update 7/84/6962 Domenik Trice is a 71 y.o. male coming in with complaint of L knee pain. Tried to get patient in a web force reaction brace but any pressure over knee joint was painful. Patient has been doing PT but discontinued recently due to pain. Patient states that his pain is surrounding the patella. Uses 1/2 tramadol a day. Physical therapy was increasing his pain. Walks 6000 steps in the morning and this will cause swelling over lateral aspect.   Past Medical History:  Diagnosis Date   DVT (deep venous thrombosis) ([facility])    right leg in 2011; prior injury to right foot   Echocardiogram 07/2021   Echocardiogram 8/22: EF 60-65, no RWMA, mild LVH, low normal RVSF, trivial AI, mild AV sclerosis w/o AS   HLD  (hyperlipidemia)    Right foot injury    Past Surgical History:  Procedure Laterality Date   NO PAST SURGERIES     Social History   Socioeconomic History   Marital status: Divorced    Spouse name: Not on file   Number of children: Not on file   Years of education: Not on file   Highest education level: Not on file  Occupational History   Not on file  Tobacco Use   Smoking status: Former    Types: Cigarettes    Quit date: 07/25/1982    Years since quitting: 40.0   Smokeless tobacco: Never   Tobacco comments:    REMOTE SOCIAL HISTORY  Substance and Sexual Activity   Alcohol use: No   Drug use: No   Sexual activity: Not Currently  Other Topics Concern   Not on file  Social History Narrative   Not on file   Social Determinants of Health   Financial Resource Strain: Not on file  Food Insecurity: Not on file  Transportation Needs: Not on file  Physical Activity: Not on file  Stress: Not on file  Social Connections: Not on file   Allergies  Allergen Reactions   Penicillins Anaphylaxis   Influenza Vaccines     Broke out in hives and had breathing problems   Family History  Problem Relation Age of Onset   Heart disease Mother    Heart disease Father    Vascular Disease Maternal Grandfather    Neuropathy  Neg Hx      Current Outpatient Medications (Cardiovascular):    atorvastatin (LIPITOR) 40 MG tablet, TAKE 1 TABLET (40 MG TOTAL) BY MOUTH DAILY AT 6 PM.  Current Outpatient Medications (Respiratory):    BREO ELLIPTA 100-25 MCG/ACT AEPB, INHALE 1 PUFF INTO THE LUNGS DAILY   fluticasone-salmeterol (ADVAIR HFA) 115-21 MCG/ACT inhaler, INHALE 2 PUFFS INTO THE LUNGS TWICE A DAY   montelukast (SINGULAIR) 10 MG tablet, TAKE 1 TABLET BY MOUTH EVERYDAY AT BEDTIME  Current Outpatient Medications (Analgesics):    allopurinol (ZYLOPRIM) 100 MG tablet, TAKE 2 TABLETS BY MOUTH EVERY DAY   meloxicam (MOBIC) 15 MG tablet, TAKE 1 TABLET (15 MG TOTAL) BY MOUTH DAILY.    traMADol (ULTRAM) 50 MG tablet, Take 1 tablet (50 mg total) by mouth every 8 (eight) hours as needed.  Current Outpatient Medications (Hematological):    rivaroxaban (XARELTO) 10 MG TABS tablet, Take 1 tablet (10 mg total) by mouth daily.  Current Outpatient Medications (Other):    cyclobenzaprine (FLEXERIL) 10 MG tablet, Take 1 tablet (10 mg total) by mouth 3 (three) times daily as needed for muscle spasms.   diclofenac Sodium (VOLTAREN) 1 % GEL, Place onto the skin.   tiZANidine (ZANAFLEX) 4 MG tablet, TAKE 1 TABLET BY MOUTH EVERY DAY AS ONE DOSE   Vitamin D, Ergocalciferol, (DRISDOL) 1.25 MG (50000 UNIT) CAPS capsule, TAKE 1 CAPSULE (50,000 UNITS TOTAL) BY MOUTH EVERY 7 (SEVEN) DAYS   Reviewed prior external information including notes and imaging from  primary care provider As well as notes that were available from care everywhere and other healthcare systems.  Past medical history, social, surgical and family history all reviewed in electronic medical record.  No pertanent information unless stated regarding to the chief complaint.   Review of Systems:  No headache, visual changes, nausea, vomiting, diarrhea, constipation, dizziness, abdominal pain, skin rash, fevers, chills, night sweats, weight loss, swollen lymph nodes, body aches, j, chest pain, shortness of breath, mood changes. POSITIVE muscle aches  Objective  Blood pressure 122/78, pulse 65, height '5\' 10"'$  (1.778 m), SpO2 98 %.   General: No apparent distress alert and oriented x3 mood and affect normal, dressed appropriately.  HEENT: Pupils equal, extraocular movements intact  Respiratory: Patient's speak in full sentences and does not appear short of breath  Cardiovascular: 1+ lower extremity edema, non tender, no erythema  Antalgic gait favoring left knee somewhat.  Patient's extensor mechanism is intact.  Patient does have swelling over the anterior aspect of the knee.  Very minorly warm but no erythema noted.  Severe  tenderness to palpation noted.  No effusion of the patellofemoral joint noted.  Patient has 90 degrees of flexion and nearly full range of motion.  Limited muscular skeletal ultrasound was performed and interpreted by Hulan Saas, M  Limited ultrasound of patient's left knee shows that there is no effusion noted in the patellofemoral joint.  Patient does have some soft tissue swelling noted with hypoechoic changes also noted.  The anterior soft tissue of the patella also has soft tissue swelling with increasing in Doppler flow in neovascularization. Impression: Soft tissue inflammation, likely still significant with still avulsion of the patella noted    Impression and Recommendations:      The above documentation has been reviewed and is accurate and complete Lyndal Pulley, DO , Joint swelling

## 2022-07-23 ENCOUNTER — Ambulatory Visit: Payer: Self-pay

## 2022-07-23 ENCOUNTER — Ambulatory Visit (INDEPENDENT_AMBULATORY_CARE_PROVIDER_SITE_OTHER): Payer: Medicare Other | Admitting: Family Medicine

## 2022-07-23 ENCOUNTER — Other Ambulatory Visit: Payer: Self-pay

## 2022-07-23 ENCOUNTER — Ambulatory Visit (INDEPENDENT_AMBULATORY_CARE_PROVIDER_SITE_OTHER): Payer: Medicare Other

## 2022-07-23 ENCOUNTER — Encounter (HOSPITAL_BASED_OUTPATIENT_CLINIC_OR_DEPARTMENT_OTHER): Payer: Self-pay | Admitting: Physical Therapy

## 2022-07-23 ENCOUNTER — Encounter: Payer: Self-pay | Admitting: Family Medicine

## 2022-07-23 VITALS — BP 122/78 | HR 65 | Ht 70.0 in

## 2022-07-23 DIAGNOSIS — G8929 Other chronic pain: Secondary | ICD-10-CM | POA: Diagnosis not present

## 2022-07-23 DIAGNOSIS — M2550 Pain in unspecified joint: Secondary | ICD-10-CM

## 2022-07-23 DIAGNOSIS — S82092A Other fracture of left patella, initial encounter for closed fracture: Secondary | ICD-10-CM | POA: Diagnosis not present

## 2022-07-23 DIAGNOSIS — M25562 Pain in left knee: Secondary | ICD-10-CM

## 2022-07-23 LAB — COMPREHENSIVE METABOLIC PANEL
ALT: 13 U/L (ref 0–53)
AST: 15 U/L (ref 0–37)
Albumin: 4 g/dL (ref 3.5–5.2)
Alkaline Phosphatase: 45 U/L (ref 39–117)
BUN: 17 mg/dL (ref 6–23)
CO2: 26 mEq/L (ref 19–32)
Calcium: 9.1 mg/dL (ref 8.4–10.5)
Chloride: 106 mEq/L (ref 96–112)
Creatinine, Ser: 1.08 mg/dL (ref 0.40–1.50)
GFR: 68.75 mL/min (ref >60.00)
Glucose, Bld: 78 mg/dL (ref 70–99)
Potassium: 4.4 mEq/L (ref 3.5–5.1)
Sodium: 139 mEq/L (ref 135–145)
Total Bilirubin: 0.5 mg/dL (ref 0.2–1.2)
Total Protein: 6.8 g/dL (ref 6.0–8.3)

## 2022-07-23 LAB — CBC WITH DIFFERENTIAL/PLATELET
Basophils Absolute: 0 10*3/uL (ref 0.0–0.1)
Basophils Relative: 0.9 % (ref 0.0–3.0)
Eosinophils Absolute: 0.1 10*3/uL (ref 0.0–0.7)
Eosinophils Relative: 2.5 % (ref 0.0–5.0)
HCT: 42 % (ref 39.0–52.0)
Hemoglobin: 13.7 g/dL (ref 13.0–17.0)
Lymphocytes Relative: 22.9 % (ref 12.0–46.0)
Lymphs Abs: 1 10*3/uL (ref 0.7–4.0)
MCHC: 32.6 g/dL (ref 30.0–36.0)
MCV: 87.6 fl (ref 78.0–100.0)
Monocytes Absolute: 0.4 10*3/uL (ref 0.1–1.0)
Monocytes Relative: 8.8 % (ref 3.0–12.0)
Neutro Abs: 2.9 10*3/uL (ref 1.4–7.7)
Neutrophils Relative %: 64.9 % (ref 43.0–77.0)
Platelets: 253 10*3/uL (ref 150.0–400.0)
RBC: 4.79 Mil/uL (ref 4.22–5.81)
RDW: 14.3 % (ref 11.5–15.5)
WBC: 4.4 10*3/uL (ref 4.0–10.5)

## 2022-07-23 LAB — C-REACTIVE PROTEIN: CRP: <1 mg/dL (ref 0.5–20.0)

## 2022-07-23 LAB — SEDIMENTATION RATE: Sed Rate: 20 mm/hr (ref 0–20)

## 2022-07-23 NOTE — Patient Instructions (Addendum)
Labs today  We will be in touch

## 2022-07-23 NOTE — Assessment & Plan Note (Signed)
Patient did have the avulsion fracture noted.  I do not see any significant healing on the x-ray or on the ultrasound today.  Patient does have some increasing in neovascularization in the area.  Patient unfortunately does have significant soft tissue swelling and fortunately also with a small effusion noted of the patellofemoral joint.  Patient does have a mild mottled appearance of the patella on x-ray.  At this point we will get labs including sedimentation rate and CRP to make sure likely infectious etiology.  Will send note to orthopedic surgeon to see if they would like to consider the possibility of aspiration.  Patient does have some tramadol for breakthrough pain if needed.  Patient can follow-up with me after we get labs and discussing different treatment options but difficult with replacement.

## 2022-07-26 ENCOUNTER — Ambulatory Visit (HOSPITAL_BASED_OUTPATIENT_CLINIC_OR_DEPARTMENT_OTHER): Payer: Medicare Other | Admitting: Physical Therapy

## 2022-07-26 ENCOUNTER — Telehealth (HOSPITAL_BASED_OUTPATIENT_CLINIC_OR_DEPARTMENT_OTHER): Payer: Self-pay | Admitting: Physical Therapy

## 2022-07-26 LAB — COBALT: Cobalt, Plasma: 0.7 mcg/L (ref <=1.8)

## 2022-07-26 LAB — CHROMIUM LEVEL: Chromium: <0.5 mcg/L (ref <=1.2)

## 2022-07-26 NOTE — Telephone Encounter (Signed)
Spoke with Pt family.  He has another fracture of patella opposite from original. Uncertain of next step, may be surgical.  They are cancelling all appointments scheduled at this time.

## 2022-07-28 ENCOUNTER — Ambulatory Visit (INDEPENDENT_AMBULATORY_CARE_PROVIDER_SITE_OTHER): Payer: Medicare Other | Admitting: Nurse Practitioner

## 2022-07-28 ENCOUNTER — Ambulatory Visit (HOSPITAL_BASED_OUTPATIENT_CLINIC_OR_DEPARTMENT_OTHER): Payer: Medicare Other | Admitting: Physical Therapy

## 2022-07-28 ENCOUNTER — Encounter: Payer: Self-pay | Admitting: Nurse Practitioner

## 2022-07-28 VITALS — BP 138/82 | HR 65 | Ht 70.0 in | Wt 189.0 lb

## 2022-07-28 DIAGNOSIS — Z86718 Personal history of other venous thrombosis and embolism: Secondary | ICD-10-CM | POA: Diagnosis not present

## 2022-07-28 DIAGNOSIS — Z86010 Personal history of colonic polyps: Secondary | ICD-10-CM | POA: Diagnosis not present

## 2022-07-28 MED ORDER — PLENVU 140 G PO SOLR: ORAL | 0 refills | Status: DC

## 2022-07-28 NOTE — Patient Instructions (Signed)
_______________________________________________________  If you are age 72 or older, your body mass index should be between 23-30. Your Body mass index is 27.12 kg/m. If this is out of the aforementioned range listed, please consider follow up with your Primary Care Provider.  If you are age 58 or younger, your body mass index should be between 19-25. Your Body mass index is 27.12 kg/m. If this is out of the aformentioned range listed, please consider follow up with your Primary Care Provider.   You have been scheduled for a colonoscopy. Please follow written instructions given to you at your visit today.  Please pick up your prep supplies at the pharmacy within the next 1-3 days. If you use inhalers (even only as needed), please bring them with you on the day of your procedure.  The [facility] providers would like to encourage you to use [facility] to communicate with providers for non-urgent requests or questions.  Due to long hold times on the telephone, sending your provider a message by [facility] may be a faster and more efficient way to get a response.  Please allow 48 business hours for a response.  Please remember that this is for non-urgent requests.   It was a pleasure to see you today!  Thank you for trusting me with your gastrointestinal care!

## 2022-07-28 NOTE — Progress Notes (Signed)
2[birth year]38 Andrew [name] [id number] [birth year]   CHIEF COMPLAINT: Schedule colonoscopy  HISTORY OF PRESENT ILLNESS: Andrew [name] is a 71 year old male with a past medical history of hyperlipidemia, RLE DVT secondary to a leg injury 2011 on Xarelto and colon polyps. Past left knee total replacement surgery. He presents to our office today as referred by Dr. Bernerd Limbo to schedule a colonoscopy.  He speaks [place] therefore his brother is present to facilitate communication as he speaks [place] fluently.  He reported undergoing 3 colonoscopies in [place], the first 2 colonoscopies were normal and his most recent colonoscopy which was 8 to 9 years ago identified a few colon polyps which were removed.  Father with history of colon polyps.  He denies having any abdominal pain but sometimes feels bloated.  He passes a normal formed brown bowel movement daily.  He frequently sees a small amount of bright red blood on the toilet tissue she attributes to having hemorrhoids.  No history of cardiac or pulmonary disease.  No other complaints today.      Latest Ref Rng & Units 07/23/2022   11:28 AM 07/15/2021   10:39 AM 10/25/2018   12:05 PM  CBC  WBC 4.0 - 10.5 K/uL 4.4  6.6  5.1   Hemoglobin 13.0 - 17.0 g/dL 13.7  13.6  14.6   Hematocrit 39.0 - 52.0 % 42.0  41.2  43.0   Platelets 150.0 - 400.0 K/uL 253.0  217  276        Latest Ref Rng & Units 07/23/2022   11:28 AM 07/15/2021   10:39 AM 03/05/2021   11:49 AM  CMP  Glucose 70 - 99 mg/dL 78  85    BUN 6 - 23 mg/dL 17  17    Creatinine 0.40 - 1.50 mg/dL 1.08  0.95    Sodium 135 - 145 mEq/L 139  140    Potassium 3.5 - 5.1 mEq/L 4.4  4.2    Chloride 96 - 112 mEq/L 106  102    CO2 19 - 32 mEq/L 26  23    Calcium 8.4 - 10.5 mg/dL 9.1  9.0    Total Protein 6.0 - 8.3 g/dL 6.8   6.5   Total Bilirubin 0.2 - 1.2 mg/dL 0.5   0.7   Alkaline Phos 39 - 117 U/L 45   46   AST 0 - 37 U/L 15   26   ALT 0 - 53 U/L 13   33     Past Medical History:   Diagnosis Date   DVT (deep venous thrombosis) (HCC)    right leg in 2011; prior injury to right foot   Echocardiogram 07/2021   Echocardiogram 8/22: EF 60-65, no RWMA, mild LVH, low normal RVSF, trivial AI, mild AV sclerosis w/o AS   HLD (hyperlipidemia)    Right foot injury    Social History: He is divorced.  Retired.  He quit smoking cigarettes 40 years ago.  No alcohol use.  No drug use.  Family History: Father with history of colon polyps and heart disease.  Mother with history of heart disease.  Maternal grandfather with history of vascular disease.  No known family history of esophageal, gastric or colorectal cancer.  Allergies  Allergen Reactions   Penicillins Anaphylaxis   Influenza Vaccines     Broke out in hives and had breathing problems      Outpatient Encounter Medications as of 07/28/2022  Medication Sig   allopurinol (ZYLOPRIM)  100 MG tablet TAKE 2 TABLETS BY MOUTH EVERY DAY   atorvastatin (LIPITOR) 40 MG tablet TAKE 1 TABLET (40 MG TOTAL) BY MOUTH DAILY AT 6 PM.   BREO ELLIPTA 100-25 MCG/ACT AEPB INHALE 1 PUFF INTO THE LUNGS DAILY   cyclobenzaprine (FLEXERIL) 10 MG tablet Take 1 tablet (10 mg total) by mouth 3 (three) times daily as needed for muscle spasms.   diclofenac Sodium (VOLTAREN) 1 % GEL Place onto the skin.   fluticasone-salmeterol (ADVAIR HFA) 115-21 MCG/ACT inhaler INHALE 2 PUFFS INTO THE LUNGS TWICE A DAY   meloxicam (MOBIC) 15 MG tablet TAKE 1 TABLET (15 MG TOTAL) BY MOUTH DAILY.   montelukast (SINGULAIR) 10 MG tablet TAKE 1 TABLET BY MOUTH EVERYDAY AT BEDTIME   rivaroxaban (XARELTO) 10 MG TABS tablet Take 1 tablet (10 mg total) by mouth daily.   tiZANidine (ZANAFLEX) 4 MG tablet TAKE 1 TABLET BY MOUTH EVERY DAY AS ONE DOSE   traMADol (ULTRAM) 50 MG tablet Take 1 tablet (50 mg total) by mouth every 8 (eight) hours as needed.   Vitamin D, Ergocalciferol, (DRISDOL) 1.25 MG (50000 UNIT) CAPS capsule TAKE 1 CAPSULE (50,000 UNITS TOTAL) BY MOUTH EVERY 7  (SEVEN) DAYS   No facility-administered encounter medications on file as of 07/28/2022.    REVIEW OF SYSTEMS:  Gen: Denies fever, sweats or chills. No weight loss.  CV: + Chronic right leg swelling.  Resp: Denies cough, shortness of breath of hemoptysis.  GI: Denies heartburn, dysphagia, stomach or lower abdominal pain. No diarrhea or constipation.  GU : Denies urinary burning, blood in urine, increased urinary frequency or incontinence. MS: + Left knee pain.  Derm: Denies rash, itchiness, skin lesions or unhealing ulcers. Psych: Denies depression, anxiety or memory loss. Heme: Denies bruising, easy bleeding. Neuro:  Denies headaches, dizziness or paresthesias. Endo:  Denies any problems with DM, thyroid or adrenal function.  PHYSICAL EXAM: BP 138/82   Pulse 65   Ht '5\' 10"'$  (1.778 m)   Wt 189 lb (85.7 kg)   BMI 27.12 kg/m   General: 71 year old male in no acute distress. Head: Normocephalic and atraumatic. Eyes:  Sclerae non-icteric, conjunctive pink. Ears: Normal auditory acuity. Mouth: Dentition intact. No ulcers or lesions.  Neck: Supple, no lymphadenopathy or thyromegaly.  Lungs: Clear bilaterally to auscultation without wheezes, crackles or rhonchi. Heart: Regular rate and rhythm. No murmur, rub or gallop appreciated.  Abdomen: Soft, nontender, non distended. No masses. No hepatosplenomegaly. Normoactive bowel sounds x 4 quadrants.  Rectal: Deferred. Musculoskeletal: Symmetrical with no gross deformities. Skin: Warm and dry. No rash or lesions on visible extremities. Extremities: No edema. Neurological: Alert oriented x 4, no focal deficits.  Psychological:  Alert and cooperative. Normal mood and affect.  ASSESSMENT AND PLAN:  33) 71 year old male with a reported history of colon polyps per colonoscopy done in [place] 8 or 9 years ago -Colonoscopy benefits and risks discussed including risk with sedation, risk of bleeding, perforation and infection  -Our office will  contact cardiologist Dr. Burt Knack to verify [place] hold prior to proceeding with a colonoscopy  2) Rectal bleeding, history of hemorrhoids. Hg 13.7. -Benefiber 1 tablespoon daily -MiraLAX nightly as needed -Apply a small amount of Desitin inside the anal opening and to the external anal area tid as needed for anal or hemorrhoidal irritation/bleeding.   3) History of RLE DVT 2011 on Xarelto  -See plan in #1   CC:  Bernerd Limbo, MD

## 2022-07-29 ENCOUNTER — Telehealth: Payer: Self-pay | Admitting: Family Medicine

## 2022-07-29 NOTE — Telephone Encounter (Signed)
Mary called, stated Dr. Tamala Julian was to reach out to Dr. Maureen Ralphs about [place] and they are wondering if we have heard anything, as they have not. Please return call to [place].

## 2022-07-29 NOTE — Progress Notes (Signed)
____________________________________________________________  Attending physician addendum:  Thank you for sending this case to me. I have reviewed the entire note and agree with the plan.   Phylisha Dix Danis, MD  ____________________________________________________________  

## 2022-07-29 NOTE — Telephone Encounter (Signed)
Spoke with Laqueta Due.

## 2022-08-02 ENCOUNTER — Ambulatory Visit (HOSPITAL_BASED_OUTPATIENT_CLINIC_OR_DEPARTMENT_OTHER): Payer: Medicare Other | Admitting: Physical Therapy

## 2022-08-04 ENCOUNTER — Ambulatory Visit (HOSPITAL_BASED_OUTPATIENT_CLINIC_OR_DEPARTMENT_OTHER): Payer: Medicare Other | Admitting: Physical Therapy

## 2022-08-06 ENCOUNTER — Other Ambulatory Visit: Payer: Self-pay | Admitting: Specialist

## 2022-08-09 ENCOUNTER — Encounter: Payer: Medicare Other | Admitting: Gastroenterology

## 2022-08-10 ENCOUNTER — Telehealth: Payer: Self-pay

## 2022-08-10 NOTE — Telephone Encounter (Signed)
Will route to PharmD for rec's re: holding anticoagulation. Richardson Dopp, PA-C    08/10/2022 6:07 PM

## 2022-08-10 NOTE — Telephone Encounter (Signed)
[place] it looks like you were working with Jaclyn Shaggy on 8/16, do you know anything about the cardiac clearance?

## 2022-08-10 NOTE — Telephone Encounter (Signed)
Pt is scheduled for a colonoscopy on 08/12/22 with Dr. Loletha Carrow.  Pt is taking Xarelto.  Pt needs cardiac clearance for procedure.  Please confirm if cardiac clearance has been received from Dr. Burt Knack.

## 2022-08-10 NOTE — Telephone Encounter (Signed)
[facility] Pre-operative Risk Assessment     Request for surgical clearance:     Endoscopy Procedure  What type of surgery is being performed?     Colonoscopy  When is this surgery scheduled?     08/12/22  What type of clearance is required ?   Pharmacy  Are there any medications that need to be held prior to surgery and how long? Xarelto 2 days   Practice name and name of physician performing surgery?      [facility]  What is your office phone and fax number?      Phone- [phone]  [phone]  Anesthesia type (None, local, MAC, general) ?       MAC

## 2022-08-11 ENCOUNTER — Ambulatory Visit: Payer: Medicare Other

## 2022-08-11 NOTE — Telephone Encounter (Signed)
Clearance was received form Cardiology and patient was aware to hold [place] 2 days prior to his procedure.

## 2022-08-11 NOTE — Telephone Encounter (Signed)
Patient with diagnosis of DVT on Xarelto for anticoagulation.    Procedure: colonoscopy Date of procedure: 08/12/22  CrCl 64 mL/min Platelet count 253K   Per office protocol, patient can hold Xarelto for 2 days prior to procedure.    **This guidance is not considered finalized until pre-operative APP has relayed final recommendations.**

## 2022-08-11 NOTE — Telephone Encounter (Signed)
   Name: Yee Joss DOB: [birth year]  MRN: [id number]  Primary Cardiologist: Sherren Mocha, MD  Chart reviewed as part of pre-operative protocol coverage.  See rec's from PharmD. Ok to hold Xarelto for 2 days. Resume as soon as possible post op.  Please call with questions. Richardson Dopp, PA-C 08/11/2022, 10:39 AM

## 2022-08-12 ENCOUNTER — Ambulatory Visit (AMBULATORY_SURGERY_CENTER): Payer: Medicare Other | Admitting: Gastroenterology

## 2022-08-12 ENCOUNTER — Encounter: Payer: Self-pay | Admitting: Gastroenterology

## 2022-08-12 VITALS — BP 128/69 | HR 44 | Temp 96.8°F | Resp 25 | Ht 70.0 in | Wt 189.0 lb

## 2022-08-12 DIAGNOSIS — Z09 Encounter for follow-up examination after completed treatment for conditions other than malignant neoplasm: Secondary | ICD-10-CM | POA: Diagnosis not present

## 2022-08-12 DIAGNOSIS — D120 Benign neoplasm of cecum: Secondary | ICD-10-CM

## 2022-08-12 DIAGNOSIS — K635 Polyp of colon: Secondary | ICD-10-CM | POA: Diagnosis not present

## 2022-08-12 DIAGNOSIS — Z86010 Personal history of colonic polyps: Secondary | ICD-10-CM

## 2022-08-12 DIAGNOSIS — D123 Benign neoplasm of transverse colon: Secondary | ICD-10-CM

## 2022-08-12 MED ORDER — SODIUM CHLORIDE 0.9 % IV SOLN: 500.0000 mL | Freq: Once | INTRAVENOUS | Status: DC

## 2022-08-12 NOTE — Progress Notes (Signed)
PT taken to PACU. Monitors in place. VSS. Report given to RN. 

## 2022-08-12 NOTE — Progress Notes (Signed)
VS by DT  Pt's states no medical or surgical changes since previsit or office visit.  

## 2022-08-12 NOTE — Patient Instructions (Signed)
Resume Xarelto tomorrow 08/13/2022    YOU HAD AN ENDOSCOPIC PROCEDURE TODAY: Refer to the procedure report and other information in the discharge instructions given to you for any specific questions about what was found during the examination. If this information does not answer your questions, please call [facility] office at [phone] to clarify.   YOU SHOULD EXPECT: Some feelings of bloating in the abdomen. Passage of more gas than usual. Walking can help get rid of the air that was put into your GI tract during the procedure and reduce the bloating. If you had a lower endoscopy (such as a colonoscopy or flexible sigmoidoscopy) you may notice spotting of blood in your stool or on the toilet paper. Some abdominal soreness may be present for a day or two, also.  DIET: Your first meal following the procedure should be a light meal and then it is ok to progress to your normal diet. A half-sandwich or bowl of soup is an example of a good first meal. Heavy or fried foods are harder to digest and may make you feel nauseous or bloated. Drink plenty of fluids but you should avoid alcoholic beverages for 24 hours. If you had a esophageal dilation, please see attached instructions for diet.    ACTIVITY: Your care partner should take you home directly after the procedure. You should plan to take it easy, moving slowly for the rest of the day. You can resume normal activity the day after the procedure however YOU SHOULD NOT DRIVE, use power tools, machinery or perform tasks that involve climbing or major physical exertion for 24 hours (because of the sedation medicines used during the test).   SYMPTOMS TO REPORT IMMEDIATELY: A gastroenterologist can be reached at any hour. Please call [phone]  for any of the following symptoms:  Following lower endoscopy (colonoscopy, flexible sigmoidoscopy) Excessive amounts of blood in the stool  Significant tenderness, worsening of abdominal pains  Swelling of the  abdomen that is new, acute  Fever of 100 or higher  FOLLOW UP:  If any biopsies were taken you will be contacted by phone or by letter within the next 1-3 weeks. Call [phone]  if you have not heard about the biopsies in 3 weeks.  Please also call with any specific questions about appointments or follow up tests.

## 2022-08-12 NOTE — Op Note (Signed)
[facility] Patient Name: Andrew [name] Procedure Date: 08/12/2022 1:25 PM MRN: [id number] Endoscopist: Mallie Mussel L. Loletha Carrow , MD Age: 71 Referring MD:  Date of Birth: 01-23-1951 Gender: Male Account #: [id number] Procedure:                Colonoscopy Indications:              Surveillance: Personal history of colonic polyps                            (unknown histology) on last colonoscopy more than 5                            years ago Medicines:                Monitored Anesthesia Care Procedure:                Pre-Anesthesia Assessment:                           - Prior to the procedure, a History and Physical                            was performed, and patient medications and                            allergies were reviewed. The patient's tolerance of                            previous anesthesia was also reviewed. The risks                            and benefits of the procedure and the sedation                            options and risks were discussed with the patient.                            All questions were answered, and informed consent                            was obtained. Prior Anticoagulants: The patient has                            taken Xarelto (rivaroxaban), last dose was 2 days                            prior to procedure. ASA Grade Assessment: II - A                            patient with mild systemic disease. After reviewing                            the risks and benefits, the patient was deemed in  satisfactory condition to undergo the procedure.                           After obtaining informed consent, the colonoscope                            was passed under direct vision. Throughout the                            procedure, the patient's blood pressure, pulse, and                            oxygen saturations were monitored continuously. The                            CF [id number] #[id number] was introduced  through the anus                            and advanced to the the terminal ileum, with                            identification of the appendiceal orifice and IC                            valve. The colonoscopy was performed without                            difficulty. The patient tolerated the procedure                            well. The quality of the bowel preparation was                            generally good after lavage (some residual fibrous                            debris). The TI, ileocecal valve, appendiceal                            orifice, and rectum were photographed. The bowel                            preparation used was Plenvu. Scope In: 1:31:25 PM Scope Out: 1:52:28 PM Scope Withdrawal Time: 0 hours 16 minutes 29 seconds  Total Procedure Duration: 0 hours 21 minutes 3 seconds  Findings:                 The perianal and digital rectal examinations were                            normal.                           The terminal ileum appeared normal.  Repeat examination of right colon under NBI                            performed.                           A 10 mm polyp was found in the cecum. The polyp was                            semi-sessile. The polyp was removed with a cold                            snare. Resection and retrieval were complete.                           A 10x3 mm polyp was found in the transverse colon.                            The polyp was semi-sessile. The polyp was removed                            with a piecemeal technique using a cold snare.                            Resection and retrieval were complete.                           A few diverticula were found in the sigmoid colon.                           Internal hemorrhoids were found.                           Anal papilla(e) were hypertrophied.                           The exam was otherwise without abnormality on                             direct and retroflexion views. Complications:            No immediate complications. Estimated Blood Loss:     Estimated blood loss was minimal. Impression:               - The examined portion of the ileum was normal.                           - One 10 mm polyp in the cecum, removed with a cold                            snare. Resected and retrieved.                           - One 10 mm polyp in the transverse colon, removed  piecemeal using a cold snare. Resected and                            retrieved.                           - Diverticulosis in the sigmoid colon.                           - Internal hemorrhoids.                           - Anal papilla(e) were hypertrophied.                           - The examination was otherwise normal on direct                            and retroflexion views. Recommendation:           - Patient has a contact number available for                            emergencies. The signs and symptoms of potential                            delayed complications were discussed with the                            patient. Return to normal activities tomorrow.                            Written discharge instructions were provided to the                            patient.                           - Resume previous diet.                           - Continue present medications.                           - Await pathology results.                           - Resume Xarelto (rivaroxaban) at prior dose                            tomorrow.                           - Repeat colonoscopy is recommended for                            surveillance. The colonoscopy date will be  determined after pathology results from today's                            exam become available for review. Split-dose                            golytely for next exam. Cele Mote L. Loletha Carrow, MD 08/12/2022 1:59:33 PM This report has been  signed electronically.

## 2022-08-12 NOTE — Progress Notes (Signed)
Called to room to assist during endoscopic procedure.  Patient ID and intended procedure confirmed with present staff. Received instructions for my participation in the procedure from the performing physician.  

## 2022-08-12 NOTE — Progress Notes (Signed)
No changes to clinical history since GI office visit on 07/28/22.  The patient is appropriate for an endoscopic procedure in the ambulatory setting.  - Wilfrid Lund, MD

## 2022-08-13 ENCOUNTER — Telehealth: Payer: Self-pay | Admitting: *Deleted

## 2022-08-13 NOTE — Telephone Encounter (Signed)
Post Procedure follow up call placed, no answer and left VM.

## 2022-08-17 ENCOUNTER — Other Ambulatory Visit: Payer: Self-pay | Admitting: Family Medicine

## 2022-08-18 ENCOUNTER — Encounter: Payer: Self-pay | Admitting: Gastroenterology

## 2022-08-18 ENCOUNTER — Ambulatory Visit (HOSPITAL_BASED_OUTPATIENT_CLINIC_OR_DEPARTMENT_OTHER): Payer: Medicare Other | Admitting: Physical Therapy

## 2022-08-25 ENCOUNTER — Ambulatory Visit (HOSPITAL_BASED_OUTPATIENT_CLINIC_OR_DEPARTMENT_OTHER): Payer: Medicare Other | Admitting: Physical Therapy

## 2022-08-26 ENCOUNTER — Ambulatory Visit: Payer: Medicare Other | Admitting: Specialist

## 2022-08-27 ENCOUNTER — Ambulatory Visit: Payer: Medicare Other | Admitting: Specialist

## 2022-09-08 ENCOUNTER — Encounter: Payer: Self-pay | Admitting: Specialist

## 2022-09-08 ENCOUNTER — Ambulatory Visit (INDEPENDENT_AMBULATORY_CARE_PROVIDER_SITE_OTHER): Payer: Medicare Other | Admitting: Specialist

## 2022-09-08 VITALS — BP 138/83 | HR 54 | Ht 70.0 in | Wt 189.0 lb

## 2022-09-08 DIAGNOSIS — M4316 Spondylolisthesis, lumbar region: Secondary | ICD-10-CM | POA: Diagnosis not present

## 2022-09-08 DIAGNOSIS — S82032G Displaced transverse fracture of left patella, subsequent encounter for closed fracture with delayed healing: Secondary | ICD-10-CM | POA: Diagnosis not present

## 2022-09-08 DIAGNOSIS — M4722 Other spondylosis with radiculopathy, cervical region: Secondary | ICD-10-CM

## 2022-09-08 DIAGNOSIS — M47816 Spondylosis without myelopathy or radiculopathy, lumbar region: Secondary | ICD-10-CM

## 2022-09-08 NOTE — Patient Instructions (Addendum)
Avoid bending, stooping and avoid lifting weights greater than 10 lbs. Avoid prolong standing and walking. Avoid frequent bending and stooping  No lifting greater than 10 lbs. May use ice or moist heat for pain. Weight loss is of benefit. Handicap license is approved. 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 AVN, ? Stress fracture without significant displacement.  Return in 6 months for spondylosis f/u with Dr. Laurance Flatten. Non operative treatment.

## 2022-09-08 NOTE — Progress Notes (Signed)
Office Visit Note   Patient: Andrew [name]           Date of Birth: Feb 22, 1951           MRN: [id number] Visit Date: 09/08/2022              Requested by: Bernerd Limbo, MD [place],  [place] [address] PCP: Bernerd Limbo, MD   Assessment & Plan: Visit Diagnoses:  1. Spondylosis without myelopathy or radiculopathy, lumbar region   2. Other spondylosis with radiculopathy, cervical region   3. Spondylolisthesis, lumbar region   4. Displaced transverse fracture of left patella, subsequent encounter for closed fracture with delayed healing     Plan: Avoid bending, stooping and avoid lifting weights greater than 10 lbs. Avoid prolong standing and walking. Avoid frequent bending and stooping  No lifting greater than 10 lbs. May use ice or moist heat for pain. Weight loss is of benefit. Handicap license is approved. 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 AVN, ? Stress fracture without significant displacement.  Return in 6 months for spondylosis f/u with Dr. Laurance Flatten. Non operative treatment.   Follow-Up Instructions: Return in about 6 months (around 03/09/2023).   Orders:  No orders of the defined types were placed in this encounter.  No orders of the defined types were placed in this encounter.     Procedures: No procedures performed   Clinical Data: No additional findings.   Subjective: Chief Complaint  Patient presents with   Left Knee - Follow-up    71 year old male with history of lumbar spondylosis and cervical spondylosis He is having intermittant pain and he is doing stretching and yoga exercises and is using a recumbant bicycle. He is concerned about the left patella post    Review of Systems  Constitutional: Negative.   HENT: Negative.    Eyes: Negative.   Respiratory: Negative.    Cardiovascular: Negative.   Gastrointestinal: Negative.   Endocrine: Negative.    Genitourinary: Negative.   Musculoskeletal: Negative.   Skin: Negative.   Allergic/Immunologic: Negative.   Neurological: Negative.   Hematological: Negative.   Psychiatric/Behavioral: Negative.       Objective: Vital Signs: BP 138/83   Pulse (!) 54   Ht '5\' 10"'$  (1.778 m)   Wt 189 lb (85.7 kg)   BMI 27.12 kg/m   Physical Exam Constitutional:      Appearance: He is well-developed.  HENT:     Head: Normocephalic and atraumatic.  Eyes:     Pupils: Pupils are equal, round, and reactive to light.  Pulmonary:     Effort: Pulmonary effort is normal.     Breath sounds: Normal breath sounds.  Abdominal:     General: Bowel sounds are normal.     Palpations: Abdomen is soft.  Musculoskeletal:     Cervical back: Normal range of motion and neck supple.     Lumbar back: Negative right straight leg raise test and negative left straight leg raise test.  Skin:    General: Skin is warm and dry.  Neurological:     Mental Status: He is alert and oriented to person, place, and time.  Psychiatric:        Behavior: Behavior normal.        Thought Content: Thought content normal.        Judgment: Judgment normal.    Back Exam   Tenderness  The  patient is experiencing tenderness in the lumbar.  Range of Motion  Extension:  abnormal  Flexion:  abnormal  Lateral bend right:  abnormal  Lateral bend left:  abnormal  Rotation right:  abnormal  Rotation left:  abnormal   Muscle Strength  Right Quadriceps:  5/5  Left Quadriceps:  5/5  Right Hamstrings:  5/5  Left Hamstrings:  5/5   Tests  Straight leg raise right: negative Straight leg raise left: negative  Reflexes  Achilles:  2/4  Other  Toe walk: normal Heel walk: normal     Specialty Comments:  No specialty comments available.  Imaging: No results found.   PMFS History: Patient Active Problem List   Diagnosis Date Noted   Left patella fracture 05/27/2022   Acute bronchitis 02/18/2022   Loss of transverse  plantar arch of right foot 12/17/2021   COVID-19 long hauler manifesting chronic cough 10/28/2021   History of COVID-19 10/28/2021   Effusion of left knee 09/09/2021   History of deep vein thrombosis (DVT) of lower extremity 05/13/2021   Degenerative cervical disc 01/16/2021   Right shoulder pain 01/16/2021   Degenerative arthritis of knee 05/28/2020   Spondylosis without myelopathy or radiculopathy, lumbar region 10/11/2016   Migraines 06/08/2016   Nuclear sclerotic cataract of both eyes 06/08/2016   Presbyopia 06/08/2016   Hypermetropia of both eyes 06/08/2016   Long term current use of anticoagulant 06/02/2016   Recurrent right inguinal hernia 12/05/2014   Chronic venous insufficiency 11/28/2014   Bradycardia 10/11/2012   Right leg DVT ([place]) 10/03/2012   HLD (hyperlipidemia) 07/25/2012   Past Medical History:  Diagnosis Date   Chronic headaches    Colon polyps    DVT (deep venous thrombosis) ([place])    right leg in 2011; prior injury to right foot   Echocardiogram 07/2021   Echocardiogram 8/22: EF 60-65, no RWMA, mild LVH, low normal RVSF, trivial AI, mild AV sclerosis w/o AS   HLD (hyperlipidemia)    Right foot injury     Family History  Problem Relation Age of Onset   Heart disease Mother    Ovarian cancer Mother    Uterine cancer Mother    Heart disease Father    Prostate cancer Brother    Colon polyps Brother    Vascular Disease Maternal Grandfather    Neuropathy Neg Hx    Stomach cancer Neg Hx    Esophageal cancer Neg Hx    Colon cancer Neg Hx    Rectal cancer Neg Hx     Past Surgical History:  Procedure Laterality Date   HERNIA REPAIR     NO PAST SURGERIES     Social History   Occupational History   Occupation: retired  Tobacco Use   Smoking status: Never   Smokeless tobacco: Never   Tobacco comments:    REMOTE SOCIAL HISTORY  Vaping Use   Vaping Use: Never used  Substance and Sexual Activity   Alcohol use: No   Drug use: No   Sexual activity:  Not Currently

## 2022-09-09 ENCOUNTER — Other Ambulatory Visit: Payer: Self-pay | Admitting: Family Medicine

## 2022-09-09 ENCOUNTER — Other Ambulatory Visit: Payer: Self-pay | Admitting: Specialist

## 2022-09-13 ENCOUNTER — Encounter: Payer: Self-pay | Admitting: Specialist

## 2022-09-16 ENCOUNTER — Telehealth: Payer: Self-pay | Admitting: Physical Medicine and Rehabilitation

## 2022-09-16 NOTE — Telephone Encounter (Signed)
Patient called. Says Dr. Louanne Skye was going to refer him to Dr. Ernestina Patches. Would like to know if Dr. Ernestina Patches does what Dr. Louanne Skye wants him to have? His call back number is [phone]

## 2022-09-16 NOTE — Telephone Encounter (Signed)
Printed and gave to Nitka 

## 2022-09-23 ENCOUNTER — Other Ambulatory Visit: Payer: Self-pay | Admitting: Family Medicine

## 2022-09-23 ENCOUNTER — Other Ambulatory Visit: Payer: Self-pay | Admitting: Physical Medicine and Rehabilitation

## 2022-09-23 ENCOUNTER — Telehealth: Payer: Self-pay | Admitting: Physical Medicine and Rehabilitation

## 2022-09-23 DIAGNOSIS — M1712 Unilateral primary osteoarthritis, left knee: Secondary | ICD-10-CM

## 2022-09-23 DIAGNOSIS — G8929 Other chronic pain: Secondary | ICD-10-CM

## 2022-09-23 NOTE — Telephone Encounter (Signed)
Pt called requesting a call back. Spoke with Dr Louanne Skye about an referral with Dr Ernestina Patches but pt has question before setting appt. Please call pt at [phone].

## 2022-09-23 NOTE — Telephone Encounter (Signed)
Gave information to Dr. Louanne Skye

## 2022-09-23 NOTE — Telephone Encounter (Signed)
Printed again and gave to [facility] to review

## 2022-09-28 ENCOUNTER — Telehealth: Payer: Self-pay | Admitting: Physical Medicine and Rehabilitation

## 2022-09-28 NOTE — Telephone Encounter (Signed)
Duplicate message in chart.  

## 2022-09-28 NOTE — Telephone Encounter (Signed)
Pt's sister Stanton Kidney called again about an appt. Please call Stanton Kidney before end of day at [phone].

## 2022-09-28 NOTE — Telephone Encounter (Signed)
Pt's sister Stanton Kidney returned call to Ander Purpura F to set appt with Dr. Ernestina Patches. Please call Mary at [phone].

## 2022-09-28 NOTE — Telephone Encounter (Signed)
I spoke with Andrew [name] and advised Andrew [name] is out of the office this afternoon. She has a question in regards to whether there will be 2 separate injections? I was unsure. She asks that we call [name] to schedule at [phone].

## 2022-09-30 NOTE — Telephone Encounter (Signed)
Tried calling to discuss below per [facility], no answer.

## 2022-10-11 ENCOUNTER — Ambulatory Visit: Payer: Self-pay

## 2022-10-11 ENCOUNTER — Ambulatory Visit (INDEPENDENT_AMBULATORY_CARE_PROVIDER_SITE_OTHER): Payer: Medicare Other | Admitting: Physical Medicine and Rehabilitation

## 2022-10-11 DIAGNOSIS — G8929 Other chronic pain: Secondary | ICD-10-CM | POA: Diagnosis not present

## 2022-10-11 DIAGNOSIS — M25562 Pain in left knee: Secondary | ICD-10-CM

## 2022-10-11 NOTE — Progress Notes (Unsigned)
.  Numeric Pain Rating Scale and Functional Assessment Average Pain 7   In the last MONTH (on 0-10 scale) has pain interfered with the following?  1. General activity like being  able to carry out your everyday physical activities such as walking, climbing stairs, carrying groceries, or moving a chair?  Rating(8)   +Driver, -BT, -Dye Allergies.  

## 2022-10-12 ENCOUNTER — Encounter: Payer: Self-pay | Admitting: Physical Medicine and Rehabilitation

## 2022-10-12 ENCOUNTER — Other Ambulatory Visit: Payer: Self-pay | Admitting: Physical Medicine and Rehabilitation

## 2022-10-12 ENCOUNTER — Ambulatory Visit (INDEPENDENT_AMBULATORY_CARE_PROVIDER_SITE_OTHER): Payer: Medicare Other | Admitting: Family Medicine

## 2022-10-12 ENCOUNTER — Ambulatory Visit: Payer: Self-pay

## 2022-10-12 ENCOUNTER — Encounter: Payer: Self-pay | Admitting: Family Medicine

## 2022-10-12 VITALS — BP 120/70 | HR 71 | Ht 70.0 in

## 2022-10-12 DIAGNOSIS — M1712 Unilateral primary osteoarthritis, left knee: Secondary | ICD-10-CM

## 2022-10-12 DIAGNOSIS — G8929 Other chronic pain: Secondary | ICD-10-CM | POA: Diagnosis not present

## 2022-10-12 DIAGNOSIS — M25562 Pain in left knee: Secondary | ICD-10-CM | POA: Diagnosis not present

## 2022-10-12 DIAGNOSIS — S82092A Other fracture of left patella, initial encounter for closed fracture: Secondary | ICD-10-CM

## 2022-10-12 NOTE — Patient Instructions (Signed)
Looking better Stay with plan with Dr. Ernestina Patches See you in 2-3 months

## 2022-10-12 NOTE — Progress Notes (Signed)
Per pain diary patient received 100% relief of pain with left sided genicular nerve blocks. We will proceed with second set.

## 2022-10-12 NOTE — Assessment & Plan Note (Signed)
Patient does not appear that there is going to heal from the avulsion fracture of the patella but patient does have improvement in range of motion and still extensor mechanism is intact.  Patient recently was given a genicular injection and seemed to make some improvement and will be scheduled for the second 1.  We discussed that this could be a good option and see how patient responds.  Patient will follow-up with me again in 2 to 3 months if necessary at that time.  No other significant changes at this time.

## 2022-10-12 NOTE — Progress Notes (Signed)
[name] [place] Phone: [phone] Subjective:   Andrew [name], am serving as a scribe for Dr. Hulan Saas.  I'm seeing this patient by the request  of:  Bernerd Limbo, MD  CC:   HPI: Subjective  9/32/6712 Andrew [name] is a 71 y.o. male coming in with complaint of L knee pain. Tried to get patient in a web force reaction brace but any pressure over knee joint was painful. Patient has been doing PT but discontinued recently due to pain. Patient states that his pain is surrounding the patella. Uses 1/2 tramadol a day. Physical therapy was increasing his pain. Walks 6000 steps in the morning and this will cause swelling over lateral aspect.   Update  10/12/2022  Today patient states that the knee is still hurting pretty bad, very sensitive last week he was having a lot of pain 8/10. Pain located to medial left knee. The knee is still very swollen. Patient went to Dr. Ernestina Patches at [facility] and they are trying a shot marcaine and a block a possible ablation.       Past Medical History:  Diagnosis Date   Chronic headaches    Colon polyps    DVT (deep venous thrombosis) ([facility])    right leg in 2011; prior injury to right foot   Echocardiogram 07/2021   Echocardiogram 8/22: EF 60-65, no RWMA, mild LVH, low normal RVSF, trivial AI, mild AV sclerosis w/o AS   HLD (hyperlipidemia)    Right foot injury    Past Surgical History:  Procedure Laterality Date   HERNIA REPAIR     NO PAST SURGERIES     Social History   Socioeconomic History   Marital status: Divorced    Spouse name: Not on file   Number of children: 0   Years of education: Not on file   Highest education level: Not on file  Occupational History   Occupation: retired  Tobacco Use   Smoking status: Never   Smokeless tobacco: Never   Tobacco comments:    REMOTE SOCIAL HISTORY  Vaping Use   Vaping Use: Never used  Substance and Sexual Activity    Alcohol use: No   Drug use: No   Sexual activity: Not Currently  Other Topics Concern   Not on file  Social History Narrative   Not on file   Social Determinants of Health   Financial Resource Strain: Not on file  Food Insecurity: Not on file  Transportation Needs: Not on file  Physical Activity: Not on file  Stress: Not on file  Social Connections: Not on file   Allergies  Allergen Reactions   Penicillins Anaphylaxis   Influenza Vaccines     Broke out in hives and had breathing problems   Family History  Problem Relation Age of Onset   Heart disease Mother    Ovarian cancer Mother    Uterine cancer Mother    Heart disease Father    Prostate cancer Brother    Colon polyps Brother    Vascular Disease Maternal Grandfather    Neuropathy Neg Hx    Stomach cancer Neg Hx    Esophageal cancer Neg Hx    Colon cancer Neg Hx    Rectal cancer Neg Hx      Current Outpatient Medications (Cardiovascular):    atorvastatin (LIPITOR) 40 MG tablet, TAKE 1 TABLET (40 MG TOTAL) BY MOUTH DAILY AT 6 PM.  Current Outpatient Medications (Respiratory):  montelukast (SINGULAIR) 10 MG tablet, TAKE 1 TABLET BY MOUTH EVERYDAY AT BEDTIME   BREO ELLIPTA 100-25 MCG/ACT AEPB, INHALE 1 PUFF INTO THE LUNGS DAILY (Patient not taking: Reported on 08/12/2022)  Current Outpatient Medications (Analgesics):    allopurinol (ZYLOPRIM) 100 MG tablet, TAKE 2 TABLETS BY MOUTH EVERY DAY   meloxicam (MOBIC) 15 MG tablet, Take 1 tablet by mouth daily.   traMADol (ULTRAM) 50 MG tablet, Take 1 tablet (50 mg total) by mouth every 8 (eight) hours as needed.  Current Outpatient Medications (Hematological):    rivaroxaban (XARELTO) 10 MG TABS tablet, Take 1 tablet (10 mg total) by mouth daily.  Current Outpatient Medications (Other):    diclofenac Sodium (VOLTAREN) 1 % GEL, Place onto the skin.   tiZANidine (ZANAFLEX) 4 MG tablet, TAKE 1 TABLET BY MOUTH EVERY DAY AS ONE DOSE   Vitamin D, Ergocalciferol,  (DRISDOL) 1.25 MG (50000 UNIT) CAPS capsule, TAKE 1 CAPSULE (50,000 UNITS TOTAL) BY MOUTH EVERY 7 (SEVEN) DAYS   cyclobenzaprine (FLEXERIL) 10 MG tablet, Take 1 tablet (10 mg total) by mouth 3 (three) times daily as needed for muscle spasms. (Patient not taking: Reported on 08/12/2022)   Reviewed prior external information including notes and imaging from  primary care provider As well as notes that were available from care everywhere and other healthcare systems.  Past medical history, social, surgical and family history all reviewed in electronic medical record.  No pertanent information unless stated regarding to the chief complaint.   Review of Systems:  No headache, visual changes, nausea, vomiting, diarrhea, constipation, dizziness, abdominal pain, skin rash, fevers, chills, night sweats, weight loss, swollen lymph nodes, body aches, joint swelling, chest pain, shortness of breath, mood changes. POSITIVE muscle aches  Objective  Blood pressure 120/70, pulse 71, height '5\' 10"'$  (1.778 m), SpO2 97 %.   General: No apparent distress alert and oriented x3 mood and affect normal, dressed appropriately.  HEENT: Pupils equal, extraocular movements intact  Respiratory: Patient's speak in full sentences and does not appear short of breath  Cardiovascular: No lower extremity edema, non tender, no erythema  Patient is having less swelling than previous exam.  Has 95 degrees of flexion and nearly full extension which is an improvement.  Knee still minorly warm to touch with a trace effusion noted.  Limited muscular skeletal ultrasound was performed and interpreted by Hulan Saas, M  Limited ultrasound still shows that there is some mild hypoechoic changes of the surrounding the replacement.  Patient does have some increasing Doppler flow over the medial aspect of the knee.  Seems to be more soft tissue related but no signs of any type of infectious etiology.  Patient still has a cortical defect of  the patella fracture noted.  It appears though that there is less irritation of the surrounding soft tissue. Impression: Interval improvement of the soft tissue aspect, continued patella fracture noted    Impression and Recommendations:     The above documentation has been reviewed and is accurate and complete Lyndal Pulley, DO

## 2022-10-14 MED ORDER — BUPIVACAINE HCL 0.5 % IJ SOLN
5.0000 mL | INTRAMUSCULAR | Status: AC | PRN
  Administered 2022-10-11: 5 mL via INTRA_ARTICULAR

## 2022-10-14 NOTE — Progress Notes (Signed)
Andrew [name] - 71 y.o. male MRN [id number]  Date of birth: [birth year]  Office Visit Note: Visit Date: 10/11/2022 PCP: Bernerd Limbo, MD Referred by: Lorine Bears, NP  Subjective: Chief Complaint  Patient presents with   Left Knee - Pain   HPI:  Andrew [name] is a 71 y.o. male who comes in today at the request of Dr. Basil Dess for planned Left  Genicular nerve block block with fluoroscopic guidance.  The patient has failed conservative care including injections, home exercise, medications, time and activity modification.  Nerve block will be performed in a double block paradigm with goal towards radiofrequency ablation of the same genicular nerves for longer term definitive care.  Please see requesting physician notes for further details and justification.     ROS Otherwise per HPI.  Assessment & Plan: Visit Diagnoses:    ICD-10-CM   1. Chronic pain of left knee  M25.562 XR C-ARM NO REPORT   G89.29       Plan: No additional findings.   Meds & Orders: No orders of the defined types were placed in this encounter.   Orders Placed This Encounter  Procedures   Large Joint Inj   XR C-ARM NO REPORT    Follow-up: No follow-ups on file.   Procedures: genicular nerve block Inj: L knee on 10/11/2022 10:30 AM Indications: pain and diagnostic evaluation Details: 25 G 3.5 in needle, fluoroscopy-guided anterior approach Medications: 5 mL bupivacaine 0.5 % Outcome: tolerated well, no immediate complications  Genicular Nerve Blockade with Fluoroscopic Guidance  Patient: Andrew [name]      Date of Birth: [birth year] MRN: [id number] PCP: Bernerd Limbo, MD      Visit Date: '@ENCDATE'$ @   Universal Protocol:    Date/Time: 11/02/235:04 AM  Consent Given By: the patient  Position: SUPINE  Additional Comments: Vital signs were monitored before and after the procedure. Patient was prepped and draped in the usual sterile fashion. The correct patient, procedure, and site was  verified.   Injection Procedure Details:  Procedure Site One Meds Administered: No orders of the defined types were placed in this encounter.    Laterality: Left  Location/Site:  Superior medial, superior lateral and inferior medial genicular nerve  Needle size: 3.5 inch 25-gauge spinal needle  Needle Placement: Just posterior to midline on the lateral view at the locations noted in the procedure note.   Findings:    -Comments: Excellent position of the needle tip using biplanar imaging with low contrast showing no vascular flow.  Procedure Details: The fluoroscope was positioned in a 90 degree anteroposterior (AP) fluoroscopic view and then tilted to obtain a true AP view of the knee joint.  The target areas are the mid-point of the curve formed where the femoral shaft and epicondyles meet as well as the midpoint of the curve on the medial side where the tibial shaft meets the plateau.  Using fluoroscopic guidance the target areas were infiltrated using a 27-gauge needle and 1-2 mL of 1% lidocaine without epinephrine to provide local anesthetic.  Then, for each target area a 3-1/2 inch 25-gauge spinal needle was driven down under intermittent fluoroscopic guidance to the periosteum of the target site.  Once all 3 needles were in place in the AP view, a lateral view was obtained at each needle was driven control to the midpoint of the shafts respectively.  A spot film was obtained of the lateral view and saved.  Next, an AP view was obtained once again to confirm  placement.  A spot film then was obtained and saved.  After fluoroscopic imaging was used to determine and confirm placement, 0.5-1 mL of injectate was delivered at each target location.  The patient was given a pain diary to complete and return to the office.      Additional Comments:  The patient tolerated the procedure well Dressing: Band-Aid    Post-procedure details: Patient was observed during the  procedure. Post-procedure instructions were reviewed.  Patient left the clinic in stable condition.         Consent was given by the patient. Immediately prior to procedure a time out was called to verify the correct patient, procedure, equipment, support staff and site/side marked as required. Patient was prepped and draped in the usual sterile fashion.          Clinical History: No specialty comments available.     Objective:  VS:  HT:    WT:   BMI:     BP:   HR: bpm  TEMP: ( )  RESP:  Physical Exam Vitals and nursing note reviewed.  Constitutional:      General: He is not in acute distress.    Appearance: Normal appearance. He is well-developed.  HENT:     Head: Normocephalic and atraumatic.  Eyes:     Conjunctiva/sclera: Conjunctivae normal.     Pupils: Pupils are equal, round, and reactive to light.  Cardiovascular:     Rate and Rhythm: Normal rate.     Pulses: Normal pulses.     Heart sounds: Normal heart sounds.  Pulmonary:     Effort: Pulmonary effort is normal. No respiratory distress.  Musculoskeletal:        General: Tenderness present.     Cervical back: Normal range of motion and neck supple. No rigidity.     Right lower leg: No edema.     Left lower leg: No edema.     Comments: Significant joint line tenderness around the left knee no apparent effusion.  Good varus and valgus stability  Skin:    General: Skin is warm and dry.     Findings: No erythema or rash.  Neurological:     General: No focal deficit present.     Mental Status: He is alert and oriented to person, place, and time.     Sensory: No sensory deficit.     Coordination: Coordination normal.     Gait: Gait normal.  Psychiatric:        Mood and Affect: Mood normal.        Behavior: Behavior normal.      Imaging: No results found.

## 2022-10-15 ENCOUNTER — Other Ambulatory Visit: Payer: Self-pay | Admitting: Specialist

## 2022-10-20 ENCOUNTER — Other Ambulatory Visit: Payer: Self-pay | Admitting: Cardiovascular Disease

## 2022-10-25 ENCOUNTER — Ambulatory Visit: Payer: Self-pay

## 2022-10-25 ENCOUNTER — Ambulatory Visit (INDEPENDENT_AMBULATORY_CARE_PROVIDER_SITE_OTHER): Payer: Medicare Other | Admitting: Physical Medicine and Rehabilitation

## 2022-10-25 VITALS — BP 125/80 | HR 60

## 2022-10-25 DIAGNOSIS — M25562 Pain in left knee: Secondary | ICD-10-CM | POA: Diagnosis not present

## 2022-10-25 DIAGNOSIS — M1712 Unilateral primary osteoarthritis, left knee: Secondary | ICD-10-CM

## 2022-10-25 DIAGNOSIS — G8929 Other chronic pain: Secondary | ICD-10-CM | POA: Diagnosis not present

## 2022-10-25 NOTE — Progress Notes (Signed)
Numeric Pain Rating Scale and Functional Assessment Average Pain 8   In the last MONTH (on 0-10 scale) has pain interfered with the following?  1. General activity like being  able to carry out your everyday physical activities such as walking, climbing stairs, carrying groceries, or moving a chair?  Rating(8)   +Driver, -BT, -Dye Allergies.  Pain gets worse in the afternoon

## 2022-10-25 NOTE — Patient Instructions (Signed)

## 2022-10-25 NOTE — Progress Notes (Signed)
Andrew [name] - 72 y.o. male MRN [id number]  Date of birth: September 30, 1951  Office Visit Note: Visit Date: 10/25/2022 PCP: Bernerd Limbo, MD Referred by: Bernerd Limbo, MD  Subjective: Chief Complaint  Patient presents with   Left Knee - Pain   HPI:  Andrew [name] is a 71 y.o. male who comes in today at the request of Dr. Basil Dess for planned repeat Left Genicular nerve block block with fluoroscopic guidance.  The patient has failed conservative care including injections, home exercise, medications, time and activity modification.  This is the second block and a double block paradigm.  Patient received more than 80% relief for the 1st nerve block.  Will proceed with radiofrequency ablation of the same genicular nerves for longer term definitive care if second block is positive.  Please see requesting physician notes for further details and justification.     ROS Otherwise per HPI.  Assessment & Plan: Visit Diagnoses:    ICD-10-CM   1. Chronic pain of left knee  M25.562 XR C-ARM NO REPORT   G89.29 Large Joint Inj: L knee    2. Unilateral primary osteoarthritis, left knee  M17.12 XR C-ARM NO REPORT    Large Joint Inj: L knee      Plan: No additional findings.   Meds & Orders: No orders of the defined types were placed in this encounter.   Orders Placed This Encounter  Procedures   Large Joint Inj: L knee   XR C-ARM NO REPORT    Follow-up: Return for Review Pain Diary.   Procedures: Large Joint Inj: L knee on 10/25/2022 3:09 PM Indications: pain and diagnostic evaluation Details: 25 G 3.5 in needle, fluoroscopy-guided anterior approach Medications: 5 mL bupivacaine 0.5 % Outcome: tolerated well, no immediate complications  Genicular Nerve Blockade with Fluoroscopic Guidance  Patient: Andrew [name]      Date of Birth: 05-29-1951 MRN: [id number] PCP: Bernerd Limbo, MD         Universal Protocol:    Date/Time: 11/13/233:09 PM  Consent Given By: the  patient  Position: SUPINE  Additional Comments: Vital signs were monitored before and after the procedure. Patient was prepped and draped in the usual sterile fashion. The correct patient, procedure, and site was verified.   Injection Procedure Details:  Procedure Site One Meds Administered: No orders of the defined types were placed in this encounter.    Laterality: Left  Location/Site:  Superior medial, superior lateral and inferior medial genicular nerve  Needle size: 3.5 inch 25-gauge spinal needle  Needle Placement: Just posterior to midline on the lateral view at the locations noted in the procedure note.   Findings:    -Comments: Excellent position of the needle tip using biplanar imaging with low contrast showing no vascular flow.  Procedure Details: The fluoroscope was positioned in a 90 degree anteroposterior (AP) fluoroscopic view and then tilted to obtain a true AP view of the knee joint.  The target areas are the mid-point of the curve formed where the femoral shaft and epicondyles meet as well as the midpoint of the curve on the medial side where the tibial shaft meets the plateau.  Using fluoroscopic guidance the target areas were infiltrated using a 27-gauge needle and 1-2 mL of 1% lidocaine without epinephrine to provide local anesthetic.  Then, for each target area a 3-1/2 inch 25-gauge spinal needle was driven down under intermittent fluoroscopic guidance to the periosteum of the target site.  Once all 3 needles were in place in  the AP view, a lateral view was obtained at each needle was driven control to the midpoint of the shafts respectively.  A spot film was obtained of the lateral view and saved.  Next, an AP view was obtained once again to confirm placement.  A spot film then was obtained and saved.  After fluoroscopic imaging was used to determine and confirm placement, 0.5-1 mL of injectate was delivered at each target location.  The patient was given a pain  diary to complete and return to the office.      Additional Comments:  The patient tolerated the procedure well Dressing: Band-Aid    Post-procedure details: Patient was observed during the procedure. Post-procedure instructions were reviewed.  Patient left the clinic in stable condition.         Consent was given by the patient. Immediately prior to procedure a time out was called to verify the correct patient, procedure, equipment, support staff and site/side marked as required. Patient was prepped and draped in the usual sterile fashion.          Clinical History: No specialty comments available.     Objective:  VS:  HT:    WT:   BMI:     BP:125/80  HR:60bpm  TEMP: ( )  RESP:  Physical Exam   Imaging: No results found.

## 2022-10-26 ENCOUNTER — Other Ambulatory Visit: Payer: Self-pay | Admitting: Physical Medicine and Rehabilitation

## 2022-10-26 DIAGNOSIS — G8929 Other chronic pain: Secondary | ICD-10-CM

## 2022-10-26 DIAGNOSIS — M1712 Unilateral primary osteoarthritis, left knee: Secondary | ICD-10-CM

## 2022-10-26 NOTE — Progress Notes (Signed)
Per pain diary 100% relief of pain with second set of diagnostic left genicular nerve blocks. We will proceed with radiofrequency ablation.

## 2022-10-29 ENCOUNTER — Other Ambulatory Visit: Payer: Self-pay | Admitting: Family Medicine

## 2022-11-03 MED ORDER — BUPIVACAINE HCL 0.5 % IJ SOLN
5.0000 mL | INTRAMUSCULAR | Status: AC | PRN
  Administered 2022-10-25: 5 mL via INTRA_ARTICULAR

## 2022-11-29 ENCOUNTER — Other Ambulatory Visit: Payer: Self-pay | Admitting: Family Medicine

## 2022-12-02 ENCOUNTER — Ambulatory Visit: Payer: Self-pay

## 2022-12-02 ENCOUNTER — Ambulatory Visit (INDEPENDENT_AMBULATORY_CARE_PROVIDER_SITE_OTHER): Payer: Medicare Other | Admitting: Physical Medicine and Rehabilitation

## 2022-12-02 VITALS — BP 151/85 | HR 62

## 2022-12-02 DIAGNOSIS — M25562 Pain in left knee: Secondary | ICD-10-CM | POA: Diagnosis not present

## 2022-12-02 DIAGNOSIS — G8929 Other chronic pain: Secondary | ICD-10-CM | POA: Diagnosis not present

## 2022-12-02 MED ORDER — METHYLPREDNISOLONE ACETATE 80 MG/ML IJ SUSP
40.0000 mg | Freq: Once | INTRAMUSCULAR | Status: AC
  Administered 2022-12-02: 40 mg

## 2022-12-02 NOTE — Patient Instructions (Signed)

## 2022-12-02 NOTE — Progress Notes (Signed)
Functional Pain Scale - descriptive words and definitions  Unmanageable (7)  Pain interferes with normal ADL's/nothing seems to help/sleep is very difficult/active distractions are very difficult to concentrate on. Severe range order  Average Pain 7   +Driver, -BT, -Dye Allergies.  Left knee pain

## 2022-12-13 NOTE — Progress Notes (Signed)
Andrew [name] - 72 y.o. male MRN [id number]  Date of birth: 09-Jan-1951  Office Visit Note: Visit Date: 12/02/2022 PCP: Bernerd Limbo, MD Referred by: Bernerd Limbo, MD  Subjective: Chief Complaint  Patient presents with   Left Knee - Pain   HPI:  Andrew [name] is a 72 y.o. male who comes in todayfor planned radiofrequency ablation of the Left knee/genicular nerve branches.  Patient has had double diagnostic blocks of the superior medial, superior lateral and inferior medial genicular nerves with more than 50% relief.  These are documented on pain diary.  They have had Left knee pain for quite some time, more than 3 months, which has been an ongoing situation with recalcitrant right knee pain.  They have no radicular pain and no prior joint replacement.  They have had physical therapy as well as home exercise program. They have had multiple knee injections, both cortisone and Viscosupplementation.  Accordingly they meet all the criteria and qualification for for radiofrequency ablation and we are going to complete this today hopefully for more longer term relief as part of comprehensive management program.    ROS Otherwise per HPI.  Assessment & Plan: Visit Diagnoses:    ICD-10-CM   1. Chronic pain of left knee  M25.562 XR C-ARM NO REPORT   G89.29 Radiofrequency,Lumbar    methylPREDNISolone acetate (DEPO-MEDROL) injection 40 mg      Plan: No additional findings.   Meds & Orders:  Meds ordered this encounter  Medications   methylPREDNISolone acetate (DEPO-MEDROL) injection 40 mg    Orders Placed This Encounter  Procedures   Radiofrequency,Lumbar   XR C-ARM NO REPORT    Follow-up: Return for visit to requesting provider as needed.   Procedures: No procedures performed  Genicular Nerve Neurotomy with Fluoroscopic Guidance  Patient: Andrew [name]      Date of Birth: 03/08/1951 MRN: [id number] PCP: Bernerd Limbo, MD      Visit Date: 12/02/2022   Universal Protocol:     Date/Time: 01/01/246:14 PM  Consent Given By: the patient  Position: SUPINE  Additional Comments: Vital signs were monitored before and after the procedure. Patient was prepped and draped in the usual sterile fashion. The correct patient, procedure, and site was verified.  Injection Procedure Details:   Procedure diagnoses: Chronic pain of left knee [M25.562, G89.29]    Meds Administered:  Meds ordered this encounter  Medications   methylPREDNISolone acetate (DEPO-MEDROL) injection 40 mg    Laterality: Left  Location/Site:  Knee: respective superior medial, superior lateral and inferior medial genicular nerves  Needle size: 18g, 34m active tip  Needle type: Cosman/[employer] RF Cannula  Findings:   -Comments: Biplanar orthogonal views demonstrated excellent placement of the cannulas along the genicular nerve paths.  Procedure Details: The fluoroscope was positioned in a 90 degree anteroposterior (AP) fluoroscopic view and then tilted to obtain a true AP view of the knee joint.  The target areas for the superior medial, superior lateral and inferior medial genicular nerves are the mid-point of the curve formed where the femoral shaft and epicondyles meet as well as the midpoint of the curve on the medial side where the tibial shaft meets the plateau.  Using fluoroscopic guidance the target areas were infiltrated using a 27-gauge needle and 1-2 mL of 1% lidocaine without epinephrine to provide local anesthetic.  Then, for each target area a radiofrequency cannula was driven down under intermittent fluoroscopic guidance to the periosteum of the target site.  Once all 3 needles were  in place in the AP view, a lateral view was obtained and each needle was driven under control to the midpoint of the shafts respectively.  A spot film was obtained of the lateral view and saved.  Next, an AP view was obtained once again to confirm placement.  A spot film then was obtained and  saved.  After fluoroscopic imaging was used to determine and confirm placement, motor stimulation was provided to confirm that there was no inadvertent motor stimulation.  Subsequently, each target area was infiltrated with 1-2 mL of the anesthetizing solution documented above.  After 2 minutes to allow for good anesthesia, a 90 second lesion was performed with the temperature maintained at [place].  This was repeated for each target area.   Additional Comments:  The patient tolerated the procedure well Dressing: band-aid    Post-procedure details: Patient was observed during the procedure. Post-procedure instructions were reviewed. Follow-Up Instructions:  Patient left the clinic in stable condition.    Clinical History: No specialty comments available.     Objective:  VS:  HT:    WT:   BMI:     BP:(!) 151/85  HR:62bpm  TEMP: ( )  RESP:  Physical Exam Vitals and nursing note reviewed.  Constitutional:      General: He is not in acute distress.    Appearance: Normal appearance. He is not ill-appearing.  HENT:     Head: Normocephalic and atraumatic.     Right Ear: External ear normal.     Left Ear: External ear normal.     Nose: No congestion.  Eyes:     Extraocular Movements: Extraocular movements intact.  Cardiovascular:     Rate and Rhythm: Normal rate.     Pulses: Normal pulses.  Pulmonary:     Effort: Pulmonary effort is normal. No respiratory distress.  Abdominal:     General: There is no distension.     Palpations: Abdomen is soft.  Musculoskeletal:        General: No tenderness or signs of injury.     Cervical back: Neck supple.     Right lower leg: No edema.     Left lower leg: No edema.     Comments: Patient has good distal strength without clonus.  Skin:    Findings: No erythema or rash.  Neurological:     General: No focal deficit present.     Mental Status: He is alert and oriented to person, place, and time.     Sensory: No sensory deficit.      Motor: No weakness or abnormal muscle tone.     Coordination: Coordination normal.  Psychiatric:        Mood and Affect: Mood normal.        Behavior: Behavior normal.      Imaging: No results found.

## 2022-12-13 NOTE — Procedures (Signed)
Genicular Nerve Neurotomy with Fluoroscopic Guidance  Patient: Andrew [name]      Date of Birth: Apr 13, 1951 MRN: [id number] PCP: Bernerd Limbo, MD      Visit Date: 12/02/2022   Universal Protocol:    Date/Time: 01/01/246:14 PM  Consent Given By: the patient  Position: SUPINE  Additional Comments: Vital signs were monitored before and after the procedure. Patient was prepped and draped in the usual sterile fashion. The correct patient, procedure, and site was verified.  Injection Procedure Details:   Procedure diagnoses: Chronic pain of left knee [M25.562, G89.29]    Meds Administered:  Meds ordered this encounter  Medications   methylPREDNISolone acetate (DEPO-MEDROL) injection 40 mg    Laterality: Left  Location/Site:  Knee: respective superior medial, superior lateral and inferior medial genicular nerves  Needle size: 18g, 57m active tip  Needle type: Cosman/[employer] RF Cannula  Findings:   -Comments: Biplanar orthogonal views demonstrated excellent placement of the cannulas along the genicular nerve paths.  Procedure Details: The fluoroscope was positioned in a 90 degree anteroposterior (AP) fluoroscopic view and then tilted to obtain a true AP view of the knee joint.  The target areas for the superior medial, superior lateral and inferior medial genicular nerves are the mid-point of the curve formed where the femoral shaft and epicondyles meet as well as the midpoint of the curve on the medial side where the tibial shaft meets the plateau.  Using fluoroscopic guidance the target areas were infiltrated using a 27-gauge needle and 1-2 mL of 1% lidocaine without epinephrine to provide local anesthetic.  Then, for each target area a radiofrequency cannula was driven down under intermittent fluoroscopic guidance to the periosteum of the target site.  Once all 3 needles were in place in the AP view, a lateral view was obtained and each needle was driven under  control to the midpoint of the shafts respectively.  A spot film was obtained of the lateral view and saved.  Next, an AP view was obtained once again to confirm placement.  A spot film then was obtained and saved.  After fluoroscopic imaging was used to determine and confirm placement, motor stimulation was provided to confirm that there was no inadvertent motor stimulation.  Subsequently, each target area was infiltrated with 1-2 mL of the anesthetizing solution documented above.  After 2 minutes to allow for good anesthesia, a 90 second lesion was performed with the temperature maintained at [place]  This was repeated for each target area.   Additional Comments:  The patient tolerated the procedure well Dressing: band-aid    Post-procedure details: Patient was observed during the procedure. Post-procedure instructions were reviewed. Follow-Up Instructions:  Patient left the clinic in stable condition.

## 2022-12-14 NOTE — Progress Notes (Unsigned)
[facility] [place] Phone: [phone] Subjective:   Andrew Andrew, am serving as a scribe for Dr. Hulan [name].  I'm seeing this patient by the request  of:  Andrew Limbo, MD  CC: Left knee pain follow-up  GHW:[id number]  10/12/2022 Patient does not appear that there is going to heal from the avulsion fracture of the patella but patient does have improvement in range of motion and still extensor mechanism is intact. Patient recently was given a genicular injection and seemed to make some improvement and will be scheduled for the second 1. We discussed that this could be a good option and see how patient responds. Patient will follow-up with me again in 2 to 3 months if necessary at that time. Andrew other significant changes at this time.   Update 07/21/3809 Andrew Andrew is a 72 y.o. male coming in with complaint of L knee pain.  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  Reviewing outside records it seems that patient did have the procedure November 13 as well as again on December 21.  Patient states that the he has been having good and bad days. Using KT tape. Continues to have swelling on L side.   Had ablation by Dr. Ernestina Andrew 2 weeks ago.          Past Medical History:  Diagnosis Date   Chronic headaches    Colon polyps    DVT (deep venous thrombosis) (HCC)    right leg in 2011; prior injury to right foot   Echocardiogram 07/2021   Echocardiogram 8/22: EF 60-65, Andrew Andrew, mild LVH, low normal RVSF, trivial AI, mild AV sclerosis w/o AS   HLD (hyperlipidemia)    Right foot injury    Past Surgical History:  Procedure Laterality Date   HERNIA REPAIR     Andrew PAST SURGERIES     Social History   Socioeconomic History   Marital status: Divorced    Spouse name: Not on file   Number of  children: 0   Years of education: Not on file   Highest education level: Not on file  Occupational History   Occupation: retired  Tobacco Use   Smoking status: Never   Smokeless tobacco: Never   Tobacco comments:    REMOTE SOCIAL HISTORY  Vaping Use   Vaping Use: Never used  Substance and Sexual Activity   Alcohol use: Andrew   Drug use: Andrew   Sexual activity: Not Currently  Other Topics Concern   Not on file  Social History Narrative   Not on file   Social Determinants of Health   Financial Resource Strain: Not on file  Food Insecurity: Not on file  Transportation Needs: Not on file  Physical Activity: Not on file  Stress: Not on file  Social Connections: Not on file   Allergies  Allergen Reactions   Penicillins Anaphylaxis   Influenza Vaccines     Broke out in hives and had breathing problems   Family History  Problem Relation Age of Onset   Heart disease Mother    Ovarian cancer Mother    Uterine cancer Mother    Heart disease Father    Prostate cancer Brother    Colon polyps Brother    Vascular Disease Maternal Grandfather    Neuropathy Neg Hx    Stomach  cancer Neg Hx    Esophageal cancer Neg Hx    Colon cancer Neg Hx    Rectal cancer Neg Hx      Current Outpatient Medications (Cardiovascular):    atorvastatin (LIPITOR) 40 MG tablet, TAKE 1 TABLET BY MOUTH DAILY AT 6 PM.  Current Outpatient Medications (Respiratory):    BREO ELLIPTA 100-25 MCG/ACT AEPB, INHALE 1 PUFF INTO THE LUNGS DAILY   montelukast (SINGULAIR) 10 MG tablet, TAKE 1 TABLET BY MOUTH EVERYDAY AT BEDTIME  Current Outpatient Medications (Analgesics):    allopurinol (ZYLOPRIM) 100 MG tablet, TAKE 2 TABLETS BY MOUTH EVERY DAY   meloxicam (MOBIC) 15 MG tablet, Take 1 tablet by mouth daily.   traMADol (ULTRAM) 50 MG tablet, Take 1 tablet (50 mg total) by mouth every 8 (eight) hours as needed.  Current Outpatient Medications (Hematological):    rivaroxaban (XARELTO) 10 MG TABS tablet, Take 1  tablet (10 mg total) by mouth daily.  Current Outpatient Medications (Other):    cyclobenzaprine (FLEXERIL) 10 MG tablet, Take 1 tablet (10 mg total) by mouth 3 (three) times daily as needed for muscle spasms.   diclofenac Sodium (VOLTAREN) 1 % GEL, Place onto the skin.   tiZANidine (ZANAFLEX) 4 MG tablet, TAKE 1 TABLET BY MOUTH EVERY DAY AS ONE DOSE   Vitamin D, Ergocalciferol, (DRISDOL) 1.25 MG (50000 UNIT) CAPS capsule, TAKE 1 CAPSULE (50,000 UNITS TOTAL) BY MOUTH EVERY 7 (SEVEN) DAYS   Reviewed prior external information including notes and imaging from  primary care provider As well as notes that were available from care everywhere and other healthcare systems.  Past medical history, social, surgical and family history all reviewed in electronic medical record.  Andrew pertanent information unless stated regarding to the chief complaint.   Review of Systems:  Andrew headache, visual changes, nausea, vomiting, diarrhea, constipation, dizziness, abdominal pain, skin rash, fevers, chills, night sweats, weight loss, swollen lymph nodes, body aches, joint swelling, chest pain, shortness of breath, mood changes. POSITIVE muscle aches  Objective  Blood pressure 108/70, pulse 60, height '5\' 10"'$  (1.778 m), weight 197 lb (89.4 kg), SpO2 98 %.   General: Andrew apparent distress alert and oriented x3 mood and affect normal, dressed appropriately.  HEENT: Pupils equal, extraocular movements intact  Respiratory: Patient's speak in full sentences and does not appear short of breath  Cardiovascular: Andrew lower extremity edema, non tender, Andrew erythema  Left knee does still have trace effusion noted.  Patient does have tenderness to palpation over the medial and patellofemoral area.   Limited muscular skeletal ultrasound was performed and interpreted by Andrew Andrew, M  Limited ultrasound of patient's left knee still shows that there is a cortical defect noted with of the patella itself on the inferior aspect.   1.93 cm gapping noted.  Patient does have hypoechoic changes still noted in the patellofemoral joint as well   Impression and Recommendations:    The above documentation has been reviewed and is accurate and complete Andrew Pulley, DO

## 2022-12-16 ENCOUNTER — Ambulatory Visit (INDEPENDENT_AMBULATORY_CARE_PROVIDER_SITE_OTHER): Payer: Medicare Other | Admitting: Family Medicine

## 2022-12-16 ENCOUNTER — Encounter: Payer: Self-pay | Admitting: Family Medicine

## 2022-12-16 ENCOUNTER — Ambulatory Visit: Payer: Self-pay

## 2022-12-16 VITALS — BP 108/70 | HR 60 | Ht 70.0 in | Wt 197.0 lb

## 2022-12-16 DIAGNOSIS — G8929 Other chronic pain: Secondary | ICD-10-CM | POA: Diagnosis not present

## 2022-12-16 DIAGNOSIS — S82092K Other fracture of left patella, subsequent encounter for closed fracture with nonunion: Secondary | ICD-10-CM

## 2022-12-16 DIAGNOSIS — M25562 Pain in left knee: Secondary | ICD-10-CM

## 2022-12-16 NOTE — Patient Instructions (Signed)
Good to see you Andrew [name]-bone stimulator See me again in 2 months

## 2022-12-16 NOTE — Assessment & Plan Note (Signed)
Continue to have difficulty we will try to see if he can get him a bone stimulator.  Still has approximately 0.9 cm distraction noted.  Patient does have some increasing in Doppler flow.  We discussed with patient that we will see how this does respond to this if possible.  Otherwise I do not think that there is anything else we can do possible at the moment.  Still family would like to follow-up again in 2-3 months.

## 2022-12-23 ENCOUNTER — Other Ambulatory Visit: Payer: Self-pay | Admitting: Family Medicine

## 2023-01-23 ENCOUNTER — Other Ambulatory Visit: Payer: Self-pay | Admitting: Family Medicine

## 2023-02-16 ENCOUNTER — Ambulatory Visit: Payer: Medicare Other | Admitting: Family Medicine

## 2023-02-17 ENCOUNTER — Ambulatory Visit: Payer: Self-pay

## 2023-02-17 ENCOUNTER — Encounter: Payer: Self-pay | Admitting: Family Medicine

## 2023-02-17 ENCOUNTER — Ambulatory Visit (INDEPENDENT_AMBULATORY_CARE_PROVIDER_SITE_OTHER): Payer: Medicare Other | Admitting: Family Medicine

## 2023-02-17 VITALS — BP 110/68 | HR 58 | Ht 70.0 in | Wt 197.0 lb

## 2023-02-17 DIAGNOSIS — M76899 Other specified enthesopathies of unspecified lower limb, excluding foot: Secondary | ICD-10-CM

## 2023-02-17 DIAGNOSIS — S82092K Other fracture of left patella, subsequent encounter for closed fracture with nonunion: Secondary | ICD-10-CM | POA: Diagnosis not present

## 2023-02-17 DIAGNOSIS — M25562 Pain in left knee: Secondary | ICD-10-CM

## 2023-02-17 NOTE — Patient Instructions (Signed)
Good to see you  Thigh compression 16th to18th of an inch heel lift Follow up in 6-8 weeks or before you leave

## 2023-02-17 NOTE — Progress Notes (Signed)
[name] [place] Phone: [phone] Subjective:   [name], am serving as a scribe for Dr. Hulan Saas.  I'm seeing this patient by the request  of:  Bernerd Limbo, MD  CC: Left knee pain follow-up  [id number]  12/16/2022 Continue to have difficulty we will try to see if he can get him a bone stimulator.  Still has approximately 0.9 cm distraction noted.  Patient does have some increasing in Doppler flow.  We discussed with patient that we will see how this does respond to this if possible.  Otherwise I do not think that there is anything else we can do possible at the moment.  Still family would like to follow-up again in 2-3 months.     Update [id number] Andrew [name] is a 72 y.o. male coming in with complaint of L knee pain. Patient states that he has been having inflammation in his knee cap and just wanting some eyes on it.  Patient was doing better with the bone stimulator.  Patient has been able to increase some activity but was a recovering bike.  Started having some increasing discomfort and pain as well.      Past Medical History:  Diagnosis Date   Chronic headaches    Colon polyps    DVT (deep venous thrombosis) (HCC)    right leg in 2011; prior injury to right foot   Echocardiogram 07/2021   Echocardiogram 8/22: EF 60-65, no RWMA, mild LVH, low normal RVSF, trivial AI, mild AV sclerosis w/o AS   HLD (hyperlipidemia)    Right foot injury    Past Surgical History:  Procedure Laterality Date   HERNIA REPAIR     NO PAST SURGERIES     Social History   Socioeconomic History   Marital status: Divorced    Spouse name: Not on file   Number of children: 0   Years of education: Not on file   Highest education level: Not on file  Occupational History   Occupation: retired  Tobacco Use   Smoking status: Never   Smokeless tobacco: Never   Tobacco comments:    REMOTE SOCIAL HISTORY  Vaping  Use   Vaping Use: Never used  Substance and Sexual Activity   Alcohol use: No   Drug use: No   Sexual activity: Not Currently  Other Topics Concern   Not on file  Social History Narrative   Not on file   Social Determinants of Health   Financial Resource Strain: Not on file  Food Insecurity: Not on file  Transportation Needs: Not on file  Physical Activity: Not on file  Stress: Not on file  Social Connections: Not on file   Allergies  Allergen Reactions   Penicillins Anaphylaxis   Influenza Vaccines     Broke out in hives and had breathing problems   Family History  Problem Relation Age of Onset   Heart disease Mother    Ovarian cancer Mother    Uterine cancer Mother    Heart disease Father    Prostate cancer Brother    Colon polyps Brother    Vascular Disease Maternal Grandfather    Neuropathy Neg Hx    Stomach cancer Neg Hx    Esophageal cancer Neg Hx    Colon cancer Neg Hx    Rectal cancer Neg Hx      Current Outpatient Medications (Cardiovascular):    atorvastatin (LIPITOR) 40 MG tablet, TAKE 1  TABLET BY MOUTH DAILY AT 6 PM.  Current Outpatient Medications (Respiratory):    BREO ELLIPTA 100-25 MCG/ACT AEPB, INHALE 1 PUFF INTO THE LUNGS DAILY   montelukast (SINGULAIR) 10 MG tablet, TAKE 1 TABLET BY MOUTH EVERYDAY AT BEDTIME  Current Outpatient Medications (Analgesics):    allopurinol (ZYLOPRIM) 100 MG tablet, TAKE 2 TABLETS BY MOUTH EVERY DAY   meloxicam (MOBIC) 15 MG tablet, Take 1 tablet by mouth daily.   traMADol (ULTRAM) 50 MG tablet, Take 1 tablet (50 mg total) by mouth every 8 (eight) hours as needed.  Current Outpatient Medications (Hematological):    rivaroxaban (XARELTO) 10 MG TABS tablet, Take 1 tablet (10 mg total) by mouth daily.  Current Outpatient Medications (Other):    cyclobenzaprine (FLEXERIL) 10 MG tablet, Take 1 tablet (10 mg total) by mouth 3 (three) times daily as needed for muscle spasms.   diclofenac Sodium (VOLTAREN) 1 % GEL,  Place onto the skin.   tiZANidine (ZANAFLEX) 4 MG tablet, TAKE 1 TABLET BY MOUTH EVERY DAY AS ONE DOSE   Vitamin D, Ergocalciferol, (DRISDOL) 1.25 MG (50000 UNIT) CAPS capsule, TAKE 1 CAPSULE (50,000 UNITS TOTAL) BY MOUTH EVERY 7 (SEVEN) DAYS   Reviewed prior external information including notes and imaging from  primary care provider As well as notes that were available from care everywhere and other healthcare systems.  Past medical history, social, surgical and family history all reviewed in electronic medical record.  No pertanent information unless stated regarding to the chief complaint.   Review of Systems:  No headache, visual changes, nausea, vomiting, diarrhea, constipation, dizziness, abdominal pain, skin rash, fevers, chills, night sweats, weight loss, swollen lymph nodes, body aches, joint swelling, chest pain, shortness of breath, mood changes. POSITIVE muscle aches, joint swelling  Objective  Blood pressure 110/68, pulse (!) 58, height '5\' 10"'$  (1.778 m), weight 197 lb (89.4 kg), SpO2 97 %.   General: No apparent distress alert and oriented x3 mood and affect normal, dressed appropriately.  HEENT: Pupils equal, extraocular movements intact  Respiratory: Patient's speak in full sentences and does not appear short of breath  Cardiovascular: No lower extremity edema, non tender, no erythema  Mild antalgic gait noted, left knee does not have as much inflammation as we have seen previously.  Limited muscular skeletal ultrasound was performed and interpreted by Hulan Saas, M  Limited ultrasound shows that there is no joint swelling of the replacement at the moment.  Cortical defect noted to the left patella fracture still approximately 1.32 cm in length.  There is some mild callus formation noted.  Patient on the medial aspect of the knee where patient is having discomfort seems to be more of the hamstring left side. Impression: Partial hamstring tendon tear    Impression and  Recommendations:    The above documentation has been reviewed and is accurate and complete Lyndal Pulley, DO

## 2023-02-17 NOTE — Assessment & Plan Note (Signed)
Continue current therapy    Discussed lifting mechanics.  Discussed proper position on the bike

## 2023-02-17 NOTE — Assessment & Plan Note (Signed)
Left-sided partial tearing noted.  Discussed L5 compression, heel lift, home exercises, new problem.  Could be potentially putting more discomfort noted icing regimen noted to be helpful.  Continue the bone stimulator for the patella fracture.  Follow-up again in 6 to 8 weeks

## 2023-03-03 ENCOUNTER — Other Ambulatory Visit: Payer: Self-pay | Admitting: Physical Medicine and Rehabilitation

## 2023-03-03 ENCOUNTER — Other Ambulatory Visit: Payer: Self-pay | Admitting: Cardiovascular Disease

## 2023-03-23 NOTE — Progress Notes (Unsigned)
[name] [address] [place] [address] Phone: [phone] Subjective:   Andrew [name], am serving as a scribe for Dr. Antoine Primas.  I'm seeing this patient by the request  of:  Andrew Harries, MD  CC: Left knee pain follow-up  ION:[id number]  02/17/2023 Left-sided partial tearing noted.  Discussed L5 compression, heel lift, home exercises, new problem.  Could be potentially putting more discomfort noted icing regimen noted to be helpful.  Continue the bone stimulator for the patella fracture.  Follow-up again in 6 to 8 weeks      Update 03/24/2023 Andrew [name] is a 72 y.o. male coming in with complaint of L hamstring pain. Patient states that for the past few days he has not had any pain. Does still have swelling over lateral aspect. Exercise seems to exacerbate his symptoms.       Past Medical History:  Diagnosis Date   Chronic headaches    Colon polyps    DVT (deep venous thrombosis) (HCC)    right leg in 2011; prior injury to right foot   Echocardiogram 07/2021   Echocardiogram 8/22: EF 60-65, no RWMA, mild LVH, low normal RVSF, trivial AI, mild AV sclerosis w/o AS   HLD (hyperlipidemia)    Right foot injury    Past Surgical History:  Procedure Laterality Date   HERNIA REPAIR     NO PAST SURGERIES     Social History   Socioeconomic History   Marital status: Divorced    Spouse name: Not on file   Number of children: 0   Years of education: Not on file   Highest education level: Not on file  Occupational History   Occupation: retired  Tobacco Use   Smoking status: Never   Smokeless tobacco: Never   Tobacco comments:    REMOTE SOCIAL HISTORY  Vaping Use   Vaping Use: Never used  Substance and Sexual Activity   Alcohol use: No   Drug use: No   Sexual activity: Not Currently  Other Topics Concern   Not on file  Social History Narrative   Not on file   Social Determinants of Health   Financial Resource Strain:  Not on file  Food Insecurity: Not on file  Transportation Needs: Not on file  Physical Activity: Not on file  Stress: Not on file  Social Connections: Not on file   Allergies  Allergen Reactions   Penicillins Anaphylaxis   Influenza Vaccines     Broke out in hives and had breathing problems   Family History  Problem Relation Age of Onset   Heart disease Mother    Ovarian cancer Mother    Uterine cancer Mother    Heart disease Father    Prostate cancer Brother    Colon polyps Brother    Vascular Disease Maternal Grandfather    Neuropathy Neg Hx    Stomach cancer Neg Hx    Esophageal cancer Neg Hx    Colon cancer Neg Hx    Rectal cancer Neg Hx     Current Outpatient Medications (Endocrine & Metabolic):    predniSONE (DELTASONE) 20 MG tablet, Take 1 tablet (20 mg total) by mouth daily with breakfast.  Current Outpatient Medications (Cardiovascular):    atorvastatin (LIPITOR) 40 MG tablet, TAKE 1 TABLET BY MOUTH DAILY AT 6 PM.  Current Outpatient Medications (Respiratory):    BREO ELLIPTA 100-25 MCG/ACT AEPB, INHALE 1 PUFF INTO THE LUNGS DAILY   montelukast (SINGULAIR) 10 MG  tablet, TAKE 1 TABLET BY MOUTH EVERYDAY AT BEDTIME  Current Outpatient Medications (Analgesics):    allopurinol (ZYLOPRIM) 100 MG tablet, TAKE 2 TABLETS BY MOUTH EVERY DAY   meloxicam (MOBIC) 15 MG tablet, Take 1 tablet by mouth daily.   traMADol (ULTRAM) 50 MG tablet, Take 1 tablet (50 mg total) by mouth every 8 (eight) hours as needed.  Current Outpatient Medications (Hematological):    rivaroxaban (XARELTO) 10 MG TABS tablet, Take 1 tablet (10 mg total) by mouth daily.  Current Outpatient Medications (Other):    cyclobenzaprine (FLEXERIL) 10 MG tablet, Take 1 tablet (10 mg total) by mouth 3 (three) times daily as needed for muscle spasms.   diclofenac Sodium (VOLTAREN) 1 % GEL, Place onto the skin.   tiZANidine (ZANAFLEX) 4 MG tablet, TAKE 1 TABLET BY MOUTH EVERY DAY AS ONE DOSE   Vitamin D,  Ergocalciferol, (DRISDOL) 1.25 MG (50000 UNIT) CAPS capsule, TAKE 1 CAPSULE (50,000 UNITS TOTAL) BY MOUTH EVERY 7 (SEVEN) DAYS   Reviewed prior external information including notes and imaging from  primary care provider As well as notes that were available from care everywhere and other healthcare systems.  Past medical history, social, surgical and family history all reviewed in electronic medical record.  No pertanent information unless stated regarding to the chief complaint.   Review of Systems:  No headache, visual changes, nausea, vomiting, diarrhea, constipation, dizziness, abdominal pain, skin rash, fevers, chills, night sweats, weight loss, swollen lymph nodes, body aches, chest pain, shortness of breath, mood changes. POSITIVE muscle aches, joint swelling  Objective  Blood pressure 128/84, pulse (!) 57, height 5\' 10"  (1.778 m), weight 195 lb (88.5 kg), SpO2 98 %.   General: No apparent distress alert and oriented x3 mood and affect normal, dressed appropriately.  HEENT: Pupils equal, extraocular movements intact  Respiratory: Patient's speak in full sentences and does not appear short of breath  Cardiovascular: No lower extremity edema, non tender, no erythema  Antalgic gait noted.  Left knee does have some mild crepitus noted.  Still has effusion noted.  Postsurgical changes noted.    Limited muscular skeletal ultrasound was performed and interpreted by Antoine Primas, M  Limited ultrasound still shows that there is the cortical irregularity noted of the patella tendon with some of the patella fracture ibuprofen did make some improvement.  Effusion of the joint still noted. Impression: Interval improvement of the patella fracture but continued effusion of knee replacement Impression and Recommendations:    The above documentation has been reviewed and is accurate and complete Judi Saa, DO

## 2023-03-24 ENCOUNTER — Encounter: Payer: Self-pay | Admitting: Family Medicine

## 2023-03-24 ENCOUNTER — Ambulatory Visit (INDEPENDENT_AMBULATORY_CARE_PROVIDER_SITE_OTHER): Payer: Medicare Other | Admitting: Family Medicine

## 2023-03-24 VITALS — BP 128/84 | HR 57 | Ht 70.0 in | Wt 195.0 lb

## 2023-03-24 DIAGNOSIS — S82092K Other fracture of left patella, subsequent encounter for closed fracture with nonunion: Secondary | ICD-10-CM

## 2023-03-24 DIAGNOSIS — M25462 Effusion, left knee: Secondary | ICD-10-CM

## 2023-03-24 MED ORDER — PREDNISONE 20 MG PO TABS: 20.0000 mg | ORAL_TABLET | Freq: Every day | ORAL | 0 refills | Status: DC

## 2023-03-24 NOTE — Assessment & Plan Note (Signed)
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  Hold on any type of aspiration.  Hopeful that this will just continue to improve.  Follow-up with me again when he returns from home.

## 2023-03-24 NOTE — Patient Instructions (Addendum)
We will miss your cookies Prednisone 20mg : use daily for 5 days at a time when you need it Use compression sleeve See me when you return

## 2023-03-24 NOTE — Assessment & Plan Note (Signed)
Interval noted.  Discussed icing regimen and home exercises.  Increase activity slowly.  I believe that the bone stimulator was helpful but not completely.  At this point though I do think that this is as good as it is potentially going to get.

## 2023-03-29 NOTE — Progress Notes (Unsigned)
Cardiology Office Note:    Date:  03/30/2023   ID:  Andrew [name], DOB [birth year], MRN [id number]  PCP:  Tracey Harries, MD [facility] Cardiologist: Tonny Bollman, MD   Reason for visit: Follow[place]up [place] going [place]  History of Present Illness:    Andrew [name] is a 72 y.o. male with a hx of DVT on chronic anticoagulation, chronic leg edema and hyperlipidemia.  He was last seen by Dr. Excell Seltzer in May 2023.  He had mild exertional dyspnea related to deconditioning.  Today, in with his brother who helps interpret for him.  They discuss complications from knee surgery.  From a heart standpoint, he denies chest pain.  He walks and gardens for exercise.  Overall, he has a good diet.  He takes his medications without issues.  He denies lightheadedness, syncope, PND, orthopnea, palpitations and bleeding.  He has chronic right leg swelling from his DVT.  He typically wears compression stockings.  His brother mentions recent cholesterol at [facility] with elevated LDL and concerned about the family history of CAD with her dad having MI at age 17.     Past Medical History:  Diagnosis Date   Chronic headaches    Colon polyps    DVT (deep venous thrombosis)    right leg in 2011; prior injury to right foot   Echocardiogram 07/2021   Echocardiogram 8/22: EF 60-65, no RWMA, mild LVH, low normal RVSF, trivial AI, mild AV sclerosis w/o AS   HLD (hyperlipidemia)    Right foot injury     Past Surgical History:  Procedure Laterality Date   HERNIA REPAIR     NO PAST SURGERIES      Current Medications: Current Meds  Medication Sig   allopurinol (ZYLOPRIM) 100 MG tablet TAKE 2 TABLETS BY MOUTH EVERY DAY   BREO ELLIPTA 100-25 MCG/ACT AEPB INHALE 1 PUFF INTO THE LUNGS DAILY   cyclobenzaprine (FLEXERIL) 10 MG tablet Take 1 tablet (10 mg total) by mouth 3 (three) times daily as needed for muscle spasms.   diclofenac Sodium (VOLTAREN) 1 % GEL Place onto the skin.   ezetimibe (ZETIA) 10 MG  tablet Take 1 tablet (10 mg total) by mouth daily.   meloxicam (MOBIC) 15 MG tablet Take 1 tablet by mouth daily.   montelukast (SINGULAIR) 10 MG tablet TAKE 1 TABLET BY MOUTH EVERYDAY AT BEDTIME   predniSONE (DELTASONE) 20 MG tablet Take 1 tablet (20 mg total) by mouth daily with breakfast.   tiZANidine (ZANAFLEX) 4 MG tablet TAKE 1 TABLET BY MOUTH EVERY DAY AS ONE DOSE   traMADol (ULTRAM) 50 MG tablet Take 1 tablet (50 mg total) by mouth every 8 (eight) hours as needed.   Vitamin D, Ergocalciferol, (DRISDOL) 1.25 MG (50000 UNIT) CAPS capsule TAKE 1 CAPSULE (50,000 UNITS TOTAL) BY MOUTH EVERY 7 (SEVEN) DAYS   [DISCONTINUED] atorvastatin (LIPITOR) 40 MG tablet TAKE 1 TABLET BY MOUTH DAILY AT 6 PM.   [DISCONTINUED] rivaroxaban (XARELTO) 10 MG TABS tablet Take 1 tablet (10 mg total) by mouth daily.     Allergies:   Penicillins and Influenza vaccines   Social History   Socioeconomic History   Marital status: Divorced    Spouse name: Not on file   Number of children: 0   Years of education: Not on file   Highest education level: Not on file  Occupational History   Occupation: retired  Tobacco Use   Smoking status: Never   Smokeless tobacco: Never   Tobacco comments:  REMOTE SOCIAL HISTORY  Vaping Use   Vaping Use: Never used  Substance and Sexual Activity   Alcohol use: No   Drug use: No   Sexual activity: Not Currently  Other Topics Concern   Not on file  Social History Narrative   Not on file   Social Determinants of Health   Financial Resource Strain: Not on file  Food Insecurity: Not on file  Transportation Needs: Not on file  Physical Activity: Not on file  Stress: Not on file  Social Connections: Not on file     Family History: The patient's family history includes Colon polyps in his brother; Heart disease in his father and mother; Ovarian cancer in his mother; Prostate cancer in his brother; Uterine cancer in his mother; Vascular Disease in his maternal  grandfather. There is no history of Neuropathy, Stomach cancer, Esophageal cancer, Colon cancer, or Rectal cancer.  ROS:   Please see the history of present illness.     EKGs/Labs/Other Studies Reviewed:    EKG:  The ekg ordered today demonstrates sinus bradycardia with heart rate 55.  Recent Labs: 07/23/2022: ALT 13; BUN 17; Creatinine, Ser 1.08; Hemoglobin 13.7; Platelets 253.0; Potassium 4.4; Sodium 139   Recent Lipid Panel Lab Results  Component Value Date/Time   CHOL 167 03/05/2021 11:49 AM   TRIG 84 03/05/2021 11:49 AM   HDL 56 03/05/2021 11:49 AM   LDLCALC 95 03/05/2021 11:49 AM    Physical Exam:    VS:  BP 132/76   Pulse (!) 55   Ht 5\' 10"  (1.778 m)   Wt 197 lb 6.4 oz (89.5 kg)   SpO2 95%   BMI 28.32 kg/m    No data found.   Wt Readings from Last 3 Encounters:  03/30/23 197 lb 6.4 oz (89.5 kg)  03/24/23 195 lb (88.5 kg)  02/17/23 197 lb (89.4 kg)     GEN:  Well nourished, well developed in no acute distress HEENT: Normal NECK: No JVD; No carotid bruits CARDIAC: RRR, no murmurs, rubs, gallops RESPIRATORY:  Clear to auscultation without rales, wheezing or rhonchi  ABDOMEN: Soft, non-tender, non-distended MUSCULOSKELETAL: 1+ R leg edema below knee with discoloration SKIN: Warm and dry NEUROLOGIC:  Alert and oriented PSYCHIATRIC:  Normal affect    ASSESSMENT AND PLAN   History of DVT -Chronic right leg swelling.  Continue compression stockings. -Continue Xarelto 10 mg daily -refilled today.  Dyspnea on exertion, stable  -Thought secondary to deconditioning -No ischemic testing warranted.  Hyperlipidemia with goal LDL less than 100 -LDL 128 in 02/2023.   -Continue Lipitor 40 mg daily. -Add Zetia 10 mg daily. -Check fasting lipids in 3 months.  If at goal, will change prescription to combination Vytorin pill. -Consider coronary calcium score with fam hx of CAD when he returns from [place].  Disposition - Follow-up in 6 months with Dr.  Jens Som.   Medication Adjustments/Labs and Tests Ordered: Current medicines are reviewed at length with the patient today.  Concerns regarding medicines are outlined above.  Orders Placed This Encounter  Procedures   Lipid panel   EKG 12-Lead   Meds ordered this encounter  Medications   atorvastatin (LIPITOR) 40 MG tablet    Sig: Take 1 tablet (40 mg total) by mouth daily.    Dispense:  90 tablet    Refill:  3    Pt must keep upcoming appt in April 2024 with Cardiologist before anymore refills. Thank you Final Attempt   rivaroxaban (XARELTO) 10 MG TABS  tablet    Sig: Take 1 tablet (10 mg total) by mouth daily.    Dispense:  90 tablet    Refill:  3   ezetimibe (ZETIA) 10 MG tablet    Sig: Take 1 tablet (10 mg total) by mouth daily.    Dispense:  90 tablet    Refill:  3    Patient Instructions  Medication Instructions:  Start Zetia 10 mg ( Take 1 Tablet Daily). *If you need a refill on your cardiac medications before your next appointment, please call your pharmacy*   Lab Work: Lipid Panel. To Be Done in 6 months If you have labs (blood work) drawn today and your tests are completely normal, you will receive your results only by: MyChart Message (if you have MyChart) OR A paper copy in the mail If you have any lab test that is abnormal or we need to change your treatment, we will call you to review the results.   Testing/Procedures: No Testing   Follow-Up: At [facility], you and your health needs are our priority.  As part of our continuing mission to provide you with exceptional heart care, we have created designated Provider Care Teams.  These Care Teams include your primary Cardiologist (physician) and Advanced Practice Providers (APPs -  Physician Assistants and Nurse Practitioners) who all work together to provide you with the care you need, when you need it.  We recommend signing up for the patient portal called "MyChart".  Sign up information is  provided on this After Visit Summary.  MyChart is used to connect with patients for Virtual Visits (Telemedicine).  Patients are able to view lab/test results, encounter notes, upcoming appointments, etc.  Non-urgent messages can be sent to your provider as well.   To learn more about what you can do with MyChart, go to [email or web address].    Your next appointment:   6 month(s)  Provider:   Tonny Bollman, MD       Signed, Cannon Kettle, PA-C  03/30/2023 12:21 PM    [facility]

## 2023-03-30 ENCOUNTER — Encounter: Payer: Self-pay | Admitting: Physician Assistant

## 2023-03-30 ENCOUNTER — Ambulatory Visit: Payer: Medicare Other | Attending: Physician Assistant | Admitting: Physician Assistant

## 2023-03-30 VITALS — BP 132/76 | HR 55 | Ht 70.0 in | Wt 197.4 lb

## 2023-03-30 DIAGNOSIS — E782 Mixed hyperlipidemia: Secondary | ICD-10-CM | POA: Diagnosis present

## 2023-03-30 DIAGNOSIS — R0602 Shortness of breath: Secondary | ICD-10-CM | POA: Diagnosis present

## 2023-03-30 DIAGNOSIS — Z86718 Personal history of other venous thrombosis and embolism: Secondary | ICD-10-CM | POA: Diagnosis present

## 2023-03-30 DIAGNOSIS — I82409 Acute embolism and thrombosis of unspecified deep veins of unspecified lower extremity: Secondary | ICD-10-CM | POA: Diagnosis present

## 2023-03-30 DIAGNOSIS — R0609 Other forms of dyspnea: Secondary | ICD-10-CM | POA: Insufficient documentation

## 2023-03-30 MED ORDER — RIVAROXABAN 10 MG PO TABS
10.0000 mg | ORAL_TABLET | Freq: Every day | ORAL | 3 refills | Status: DC
Start: 2023-03-30 — End: 2023-12-29

## 2023-03-30 MED ORDER — EZETIMIBE 10 MG PO TABS: 10.0000 mg | ORAL_TABLET | Freq: Every day | ORAL | 3 refills | Status: AC

## 2023-03-30 MED ORDER — ATORVASTATIN CALCIUM 40 MG PO TABS: 40.0000 mg | ORAL_TABLET | Freq: Every day | ORAL | 3 refills | Status: DC

## 2023-03-30 NOTE — Patient Instructions (Signed)
Medication Instructions:  Start Zetia 10 mg ( Take 1 Tablet Daily). *If you need a refill on your cardiac medications before your next appointment, please call your pharmacy*   Lab Work: Lipid Panel. To Be Done in 6 months If you have labs (blood work) drawn today and your tests are completely normal, you will receive your results only by: MyChart Message (if you have MyChart) OR A paper copy in the mail If you have any lab test that is abnormal or we need to change your treatment, we will call you to review the results.   Testing/Procedures: No Testing   Follow-Up: At [facility], you and your health needs are our priority.  As part of our continuing mission to provide you with exceptional heart care, we have created designated Provider Care Teams.  These Care Teams include your primary Cardiologist (physician) and Advanced Practice Providers (APPs -  Physician Assistants and Nurse Practitioners) who all work together to provide you with the care you need, when you need it.  We recommend signing up for the patient portal called "MyChart".  Sign up information is provided on this After Visit Summary.  MyChart is used to connect with patients for Virtual Visits (Telemedicine).  Patients are able to view lab/test results, encounter notes, upcoming appointments, etc.  Non-urgent messages can be sent to your provider as well.   To learn more about what you can do with MyChart, go to [email or web address].    Your next appointment:   6 month(s)  Provider:   Tonny Bollman, MD

## 2023-04-25 ENCOUNTER — Other Ambulatory Visit: Payer: Self-pay | Admitting: Family Medicine

## 2023-05-06 ENCOUNTER — Other Ambulatory Visit: Payer: Self-pay | Admitting: Family Medicine

## 2023-07-21 ENCOUNTER — Other Ambulatory Visit: Payer: Self-pay | Admitting: Family Medicine

## 2023-09-09 ENCOUNTER — Other Ambulatory Visit: Payer: Self-pay | Admitting: Physical Medicine and Rehabilitation

## 2023-11-28 ENCOUNTER — Ambulatory Visit: Payer: Medicare Other | Admitting: Cardiovascular Disease

## 2023-12-05 ENCOUNTER — Ambulatory Visit (HOSPITAL_BASED_OUTPATIENT_CLINIC_OR_DEPARTMENT_OTHER): Payer: Medicare Other | Admitting: Pulmonary Disease

## 2023-12-05 ENCOUNTER — Encounter (HOSPITAL_BASED_OUTPATIENT_CLINIC_OR_DEPARTMENT_OTHER): Payer: Self-pay | Admitting: Pulmonary Disease

## 2023-12-05 VITALS — BP 112/60 | HR 61 | Resp 16 | Ht 70.0 in | Wt 196.7 lb

## 2023-12-05 DIAGNOSIS — U099 Post covid-19 condition, unspecified: Secondary | ICD-10-CM

## 2023-12-05 DIAGNOSIS — R053 Chronic cough: Secondary | ICD-10-CM

## 2023-12-05 NOTE — Progress Notes (Signed)
Subjective:   PATIENT ID: Andrew [name] GENDER: male DOB: [birth year], MRN: [id number]   HPI  Chief Complaint  Patient presents with   Follow-up    Cough-Cough is ok, somewhat better    Reason for Visit: Follow-up  Andrew [name] is a 72 year old male with history of DVT, chronic leg swelling and HLD who presents follow-up. Brother and SIL present for translation for [place].  Synopsis He was diagnosed with COVID-19 in July 2022. He continues to have persistent cough. Reports left rib cage pain.  Worsens when he coughs and lays on the left side. He reports that he is still coughing at night. Oxygen saturations will go to 90%. He reports cough is occasionally productive but mainly dry cough. He does have wheezing when laying down at night. Duonebs helped significantly. Denies chronic respiratory illnesses.   10/26/21 Since our last visit he was started on Advair and he reports his cough has improved. Has been using the Advair 1 puff twice a day for 3 days a week. Denies wheezing or shortness of breath. Still coughing some clear phlegm. Has not needed to use nebulizer. No nocturnal symtpoms. No longer having chest/rib pain.  12/30/21 Since our last visit, he has been compliant with Advair as directed and reports 90% improvement in his cough. Minimal phlegm. Does not require rescue inhaler or nebulizer. He has not been active due to left knee issues. No recurrent chest or rib pain.   02/18/22 He reports one week of worsening cough with productive cough. Using duonebs with some improvement. Wheezing and shortness of breath. Denies fevers, chills.   12/05/23 He had returned to [place] in the spring time and had gotten worsening productive cough and wheezing for 4-5 months. Was diagnosed with acute bronchitis. Was vaccinated for whooping cough and may have had some improvement. He returned to [place] in mid-November. Occasional cough in the morning. Denies wheezing.  Social  History: <3 years. Social smoker.  Past Medical History:  Diagnosis Date   Chronic headaches    Colon polyps    DVT (deep venous thrombosis) (HCC)    right leg in 2011; prior injury to right foot   Echocardiogram 07/2021   Echocardiogram 8/22: EF 60-65, no RWMA, mild LVH, low normal RVSF, trivial AI, mild AV sclerosis w/o AS   HLD (hyperlipidemia)    Right foot injury      Family History  Problem Relation Age of Onset   Heart disease Mother    Ovarian cancer Mother    Uterine cancer Mother    Heart disease Father    Prostate cancer Brother    Colon polyps Brother    Vascular Disease Maternal Grandfather    Neuropathy Neg Hx    Stomach cancer Neg Hx    Esophageal cancer Neg Hx    Colon cancer Neg Hx    Rectal cancer Neg Hx      Social History   Occupational History   Occupation: retired  Tobacco Use   Smoking status: Never   Smokeless tobacco: Never   Tobacco comments:    REMOTE SOCIAL HISTORY  Vaping Use   Vaping status: Never Used  Substance and Sexual Activity   Alcohol use: No   Drug use: No   Sexual activity: Not Currently    Allergies  Allergen Reactions   Penicillins Anaphylaxis   Influenza Vaccines     Broke out in hives and had breathing problems     Outpatient Medications Prior to Visit  Medication Sig Dispense Refill   allopurinol (ZYLOPRIM) 100 MG tablet TAKE 2 TABLETS BY MOUTH EVERY DAY 180 tablet 0   atorvastatin (LIPITOR) 40 MG tablet Take 1 tablet (40 mg total) by mouth daily. 90 tablet 3   BREO ELLIPTA 100-25 MCG/ACT AEPB INHALE 1 PUFF INTO THE LUNGS DAILY 60 each 3   cyclobenzaprine (FLEXERIL) 10 MG tablet Take 1 tablet (10 mg total) by mouth 3 (three) times daily as needed for muscle spasms. 30 tablet 0   diclofenac Sodium (VOLTAREN) 1 % GEL Place onto the skin.     meloxicam (MOBIC) 15 MG tablet Take 1 tablet by mouth daily.     montelukast (SINGULAIR) 10 MG tablet TAKE 1 TABLET BY MOUTH EVERYDAY AT BEDTIME 90 tablet 1   predniSONE  (DELTASONE) 20 MG tablet Take 1 tablet (20 mg total) by mouth daily with breakfast. 30 tablet 0   rivaroxaban (XARELTO) 10 MG TABS tablet Take 1 tablet (10 mg total) by mouth daily. 90 tablet 3   tiZANidine (ZANAFLEX) 4 MG tablet TAKE 1 TABLET BY MOUTH EVERY DAY AS ONE DOSE 90 tablet 1   traMADol (ULTRAM) 50 MG tablet Take 1 tablet (50 mg total) by mouth every 8 (eight) hours as needed. 40 tablet 0   Vitamin D, Ergocalciferol, (DRISDOL) 1.25 MG (50000 UNIT) CAPS capsule TAKE 1 CAPSULE (50,000 UNITS TOTAL) BY MOUTH EVERY 7 (SEVEN) DAYS 12 capsule 0   ezetimibe (ZETIA) 10 MG tablet Take 1 tablet (10 mg total) by mouth daily. 90 tablet 3   No facility-administered medications prior to visit.    Review of Systems  Constitutional:  Negative for chills, diaphoresis, fever, malaise/fatigue and weight loss.  HENT:  Negative for congestion.   Respiratory:  Positive for cough. Negative for hemoptysis, sputum production, shortness of breath and wheezing.   Cardiovascular:  Negative for chest pain, palpitations and leg swelling.     Objective:   Vitals:   12/05/23 0907  BP: 112/60  Pulse: 61  Resp: 16  SpO2: 96%  Weight: 196 lb 11.2 oz (89.2 kg)  Height: 5\' 10"  (1.778 m)   SpO2: 96 %  Physical Exam: General: Well-appearing, no acute distress HENT: [place], AT Eyes: EOMI, no scleral icterus Respiratory: Clear to auscultation bilaterally.  No crackles, wheezing or rales Cardiovascular: RRR, -M/R/G, no JVD Extremities:-Edema,-tenderness Neuro: AAO x4, CNII-XII grossly intact Psych: Normal mood, normal affect  Data Reviewed:  Imaging: CXR 12/12/14 - No infiltrate, edema, effusion CXR 08/05/2021 bibasilar atelectasis.  No rib fracture present CXR 02/18/22 - Normal CXR. No infiltrate, edema or effusion  PFT: None on file  Echocardiogream: EF 60-65%. Mild LVH  Labs: CBC    Component Value Date/Time   WBC 4.4 07/23/2022 1128   RBC 4.79 07/23/2022 1128   HGB 13.7 07/23/2022 1128   HGB  13.6 07/15/2021 1039   HCT 42.0 07/23/2022 1128   HCT 41.2 07/15/2021 1039   PLT 253.0 07/23/2022 1128   PLT 217 07/15/2021 1039   MCV 87.6 07/23/2022 1128   MCV 90 07/15/2021 1039   MCH 29.6 07/15/2021 1039   MCH 29.8 04/28/2016 1113   MCHC 32.6 07/23/2022 1128   RDW 14.3 07/23/2022 1128   RDW 12.6 07/15/2021 1039   LYMPHSABS 1.0 07/23/2022 1128   MONOABS 0.4 07/23/2022 1128   EOSABS 0.1 07/23/2022 1128   BASOSABS 0.0 07/23/2022 1128   Absolute eos 08/11/13 - 100     Assessment & Plan:   Discussion: 72 year old [place] speaking male  with hx DVT, chronic leg swelling, HLD and chronic left knee pain who presents for follow-up. Hx of prolonged respiratory illness with cough. Improved on intermittent ICS/LABA until he is ill. Recent pandemic in his hometown. Improved but concerned for long term effects. Will evaluate with PFTs  COVID long hauler manifesting as cough Acute on chronic bronchitis/asthma-like features --ORDER pulmonary function tests. Will call brother for results --CONTINUE Albuterol AS NEEDED for shortness of breath or wheezing   Health Maintenance Immunization History  Administered Date(s) Administered   Influenza Split 12/13/2002   PFIZER Comirnaty(Gray Top)Covid-19 Tri-Sucrose Vaccine 07/09/2021   PFIZER(Purple Top)SARS-COV-2 Vaccination 02/14/2020, 03/11/2020, 09/26/2020   PPD Test 12/13/2010   Pneumococcal Conjugate-13 12/15/2017   Pneumococcal Polysaccharide-23 07/10/2019   Tdap 12/13/2008   CT Lung Screen-not qualified.  Never smoker  Orders Placed This Encounter  Procedures   Pulmonary function test    Standing Status:   Future    Expiration Date:   12/04/2024    Where should this test be performed?:   [facility]    Full PFT: includes the following: basic spirometry, spirometry pre & post bronchodilator, diffusion capacity (DLCO), lung volumes:   Full PFT   No orders of the defined types were placed in this encounter.  Return if  symptoms worsen or fail to improve.  I have spent a total time of 30-minutes on the day of the appointment including chart review, data review, collecting history, coordinating care and discussing medical diagnosis and plan with the patient/family. Past medical history, allergies, medications were reviewed. Pertinent imaging, labs and tests included in this note have been reviewed and interpreted independently by me.  Mickael Mcnutt Mechele Collin, MD [facility] 12/05/2023 9:55 AM  Office Number [phone]

## 2023-12-05 NOTE — Patient Instructions (Signed)
COVID long hauler manifesting as cough Chronic bronchitis/asthma-like features --ORDER pulmonary function tests. Will call brother for results --CONTINUE Albuterol AS NEEDED for shortness of breath or wheezing

## 2023-12-09 ENCOUNTER — Telehealth: Payer: Self-pay

## 2023-12-09 ENCOUNTER — Ambulatory Visit (HOSPITAL_BASED_OUTPATIENT_CLINIC_OR_DEPARTMENT_OTHER): Payer: Medicare Other | Admitting: Pulmonary Disease

## 2023-12-09 DIAGNOSIS — R053 Chronic cough: Secondary | ICD-10-CM | POA: Diagnosis not present

## 2023-12-09 LAB — PULMONARY FUNCTION TEST
DL/VA % pred: 111 %
DL/VA: 4.49 ml/min/mmHg/L
DLCO cor % pred: 89 %
DLCO cor: 21.19 ml/min/mmHg
DLCO unc % pred: 89 %
DLCO unc: 21.19 ml/min/mmHg
FEF 25-75 Post: 3.22 L/s
FEF 25-75 Pre: 3.12 L/s
FEF2575-%Change-Post: 3 %
FEF2575-%Pred-Post: 151 %
FEF2575-%Pred-Pre: 147 %
FEV1-%Change-Post: 2 %
FEV1-%Pred-Post: 104 %
FEV1-%Pred-Pre: 101 %
FEV1-Post: 2.98 L
FEV1-Pre: 2.9 L
FEV1FVC-%Change-Post: 1 %
FEV1FVC-%Pred-Pre: 112 %
FEV6-%Change-Post: 1 %
FEV6-%Pred-Post: 97 %
FEV6-%Pred-Pre: 95 %
FEV6-Post: 3.6 L
FEV6-Pre: 3.53 L
FEV6FVC-%Pred-Post: 106 %
FEV6FVC-%Pred-Pre: 106 %
FVC-%Change-Post: 1 %
FVC-%Pred-Post: 91 %
FVC-%Pred-Pre: 89 %
FVC-Post: 3.6 L
FVC-Pre: 3.53 L
Post FEV1/FVC ratio: 83 %
Post FEV6/FVC ratio: 100 %
Pre FEV1/FVC ratio: 82 %
Pre FEV6/FVC Ratio: 100 %
RV % pred: 77 %
RV: 1.85 L
TLC % pred: 83 %
TLC: 5.49 L

## 2023-12-09 MED ORDER — ALBUTEROL SULFATE HFA 108 (90 BASE) MCG/ACT IN AERS: 2.0000 | INHALATION_SPRAY | Freq: Four times a day (QID) | RESPIRATORY_TRACT | 1 refills | Status: DC | PRN

## 2023-12-09 NOTE — Addendum Note (Signed)
Addended by: Luciano Cutter on: 12/09/2023 10:56 AM   Modules accepted: Orders

## 2023-12-09 NOTE — Telephone Encounter (Signed)
I will send a message to surgeon office at this moment as the office currently closed. I will call on Monday to confirm surgery is not urgent. Pt has appt with his cardiologist Dr. Excell Seltzer 01/12/24.

## 2023-12-09 NOTE — Patient Instructions (Signed)
Full PFT Performed Today  

## 2023-12-09 NOTE — Telephone Encounter (Signed)
   Pre-operative Risk Assessment    Patient Name: Andrew [name]  DOB: 1951-04-06 MRN: [id number]   Date of last office visit: 03/30/2023 Date of next office visit: 01/12/2024   Request for Surgical Clearance    Procedure:   Lt Patellectomy  Date of Surgery:  Clearance TBD                                Surgeon:  Weber Cooks, MD Surgeon's Group or Practice Name:  [facility] Phone number:  [phone] Fax number:  [phone]   Type of Clearance Requested:   - Medical  - Pharmacy:  Hold Rivaroxaban (Xarelto)     Type of Anesthesia:   Choice   Additional requests/questions:    Garrel Ridgel   12/09/2023, 1:11 PM

## 2023-12-09 NOTE — Telephone Encounter (Signed)
Patient with diagnosis of DVT on Xarelto for anticoagulation.    Procedure:   Lt Patellectomy   Date of Surgery:  Clearance TBD   Per chart had DVT prior to 2011, then in 2013.  Currently on prophylactic Xarelto  CrCl 58 Platelet count 259  Per office protocol, patient can hold Xarelto for 3 days prior to procedure.   Patient will not need bridging with Lovenox (enoxaparin) around procedure.  **This guidance is not considered finalized until pre-operative APP has relayed final recommendations.**

## 2023-12-09 NOTE — Progress Notes (Signed)
Full PFT Performed Today  

## 2023-12-12 NOTE — Telephone Encounter (Signed)
Spoke with Surgeon office aware patient scheduled 01-12-24

## 2023-12-21 DIAGNOSIS — Z96652 Presence of left artificial knee joint: Secondary | ICD-10-CM | POA: Insufficient documentation

## 2023-12-23 ENCOUNTER — Other Ambulatory Visit (HOSPITAL_COMMUNITY): Payer: Self-pay | Admitting: Orthopedic Surgery

## 2023-12-23 DIAGNOSIS — Z96652 Presence of left artificial knee joint: Secondary | ICD-10-CM

## 2023-12-26 ENCOUNTER — Ambulatory Visit: Payer: Medicare Other | Admitting: Cardiovascular Disease

## 2023-12-28 ENCOUNTER — Ambulatory Visit: Payer: Medicare Other | Admitting: Orthopaedic Surgery

## 2023-12-29 ENCOUNTER — Ambulatory Visit: Payer: Medicare Other | Attending: Cardiovascular Disease | Admitting: Cardiovascular Disease

## 2023-12-29 ENCOUNTER — Encounter: Payer: Self-pay | Admitting: Cardiovascular Disease

## 2023-12-29 VITALS — BP 120/76 | HR 65 | Ht 70.0 in | Wt 193.0 lb

## 2023-12-29 DIAGNOSIS — R0602 Shortness of breath: Secondary | ICD-10-CM | POA: Insufficient documentation

## 2023-12-29 DIAGNOSIS — R0609 Other forms of dyspnea: Secondary | ICD-10-CM | POA: Insufficient documentation

## 2023-12-29 DIAGNOSIS — Z01810 Encounter for preprocedural cardiovascular examination: Secondary | ICD-10-CM | POA: Diagnosis present

## 2023-12-29 DIAGNOSIS — I82509 Chronic embolism and thrombosis of unspecified deep veins of unspecified lower extremity: Secondary | ICD-10-CM | POA: Diagnosis not present

## 2023-12-29 DIAGNOSIS — E782 Mixed hyperlipidemia: Secondary | ICD-10-CM | POA: Diagnosis present

## 2023-12-29 DIAGNOSIS — I2089 Other forms of angina pectoris: Secondary | ICD-10-CM | POA: Diagnosis present

## 2023-12-29 NOTE — Patient Instructions (Addendum)
Medication Instructions:  STOP Xarelto *If you need a refill on your cardiac medications before your next appointment, please call your pharmacy*  Testing/Procedures: Coronary CT Angiogram Your physician has requested that you have cardiac CT. Cardiac computed tomography (CT) is a painless test that uses an x-ray machine to take clear, detailed pictures of your heart. For further information please visit [email or web address] Please follow instruction sheet as given.   Follow-Up: At [facility], you and your health needs are our priority.  As part of our continuing mission to provide you with exceptional heart care, we have created designated Provider Care Teams.  These Care Teams include your primary Cardiologist (physician) and Advanced Practice Providers (APPs -  Physician Assistants and Nurse Practitioners) who all work together to provide you with the care you need, when you need it.  Your next appointment:   1 year(s)  Provider:   Tonny Bollman, MD       Other Instructions   Your cardiac CT will be scheduled at one of the below locations:   [facility] [address] [place], [place] [address] [phone]  please arrive at the [facility] and Children's Entrance (Entrance C2) of [facility] 30 minutes prior to test start time. You can use the FREE valet parking offered at entrance C (encouraged to control the heart rate for the test)  Proceed to the [facility] Radiology Department (first floor) to check-in and test prep.  All radiology patients and guests should use entrance C2 at [facility], accessed from [facility], even though the hospital's physical address listed is [address]    Please follow these instructions carefully (unless otherwise directed):  An IV will be required for this test and Nitroglycerin will be given.  Hold all erectile dysfunction medications at least 3 days (72 hrs) prior to test.  (Ie viagra, cialis, sildenafil, tadalafil, etc)   On the Night Before the Test: Be sure to Drink plenty of water. Do not consume any caffeinated/decaffeinated beverages or chocolate 12 hours prior to your test. Do not take any antihistamines 12 hours prior to your test.  On the Day of the Test: Drink plenty of water until 1 hour prior to the test. Do not eat any food 1 hour prior to test. You may take your regular medications prior to the test.  If you take Furosemide/Hydrochlorothiazide/Spironolactone/Chlorthalidone, please HOLD on the morning of the test. Patients who wear a continuous glucose monitor MUST remove the device prior to scanning.      After the Test: Drink plenty of water. After receiving IV contrast, you may experience a mild flushed feeling. This is normal. On occasion, you may experience a mild rash up to 24 hours after the test. This is not dangerous. If this occurs, you can take Benadryl 25 mg and increase your fluid intake. If you experience trouble breathing, this can be serious. If it is severe call 911 IMMEDIATELY. If it is mild, please call our office.  We will call to schedule your test 2-4 weeks out understanding that some insurance companies will need an authorization prior to the service being performed.   For more information and frequently asked questions, please visit our website : [email or web address]  For non-scheduling related questions, please contact the cardiac imaging nurse navigator should you have any questions/concerns: Cardiac Imaging Nurse Navigators Direct Office Dial: [phone]   For scheduling needs, including cancellations and rescheduling, please call [place], [phone].  1st Floor: - Lobby - Registration  - Pharmacy  - Lab - Cafe  2nd Floor: - PV Lab - Diagnostic Testing (echo, CT, nuclear med)  3rd Floor: - Vacant  4th Floor: - TCTS (cardiothoracic surgery) - AFib Clinic - Structural Heart  Clinic - Vascular Surgery  - Vascular Ultrasound  5th Floor: - HeartCare Cardiology (general and EP) - Clinical Pharmacy for coumadin, hypertension, lipid, weight-loss medications, and med management appointments    Valet parking services will be available as well.

## 2023-12-29 NOTE — Assessment & Plan Note (Signed)
LDL cholesterol is greater than 100.  He remains on atorvastatin and ezetimibe.  Will update lipids and LFTs today.  Will further address his LDL goal based on the presence or absence of coronary artery disease on his upcoming coronary CTA.

## 2023-12-29 NOTE — Progress Notes (Signed)
Cardiology Office Note:    Date:  12/29/2023   ID:  Andrew [name], DOB Aug 24, 1951, MRN [id number]  PCP:  Tracey Harries, MD   [facility] Providers Cardiologist:  Tonny Bollman, MD     Referring MD: Tracey Harries, MD   Chief Complaint  Patient presents with   Follow-up    Hyperlipidemia    History of Present Illness:    Andrew [name] is a 73 y.o. 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  The patient is here with his brother today.  He has been having trouble with his left knee following knee replacement a few years ago.  He is having a patellar issue and a lot of pain in his knee.  He has been unable to take nonsteroidals which have worked very well for him in the past.  He cannot take them because of the use of rivaroxaban.  He is currently taking Ultram but it is not working well.  The patient complains of shortness of breath with activity.  He is concerned about his heart because his father died of a heart attack at about this age.  He is not having chest pain or pressure.  He denies orthopnea or PND.  No heart palpitations.   Current Medications: Current Meds  Medication Sig   albuterol (VENTOLIN HFA) 108 (90 Base) MCG/ACT inhaler Inhale 2 puffs into the lungs every 6 (six) hours as needed for wheezing or shortness of breath.   allopurinol (ZYLOPRIM) 100 MG tablet TAKE 2 TABLETS BY MOUTH EVERY DAY (Patient taking differently: as needed.)   atorvastatin (LIPITOR) 40 MG tablet Take 1 tablet (40 mg total) by mouth daily.   BREO ELLIPTA 100-25 MCG/ACT AEPB INHALE 1 PUFF INTO THE LUNGS DAILY   cyclobenzaprine (FLEXERIL) 10 MG tablet Take 1 tablet (10 mg total) by mouth 3 (three) times daily as needed for muscle spasms.   diclofenac Sodium (VOLTAREN) 1 % GEL Place onto the skin.   gabapentin (NEURONTIN) 300 MG capsule Take 300 mg by mouth as needed.    meloxicam (MOBIC) 15 MG tablet Take 1 tablet by mouth daily.   montelukast (SINGULAIR) 10 MG tablet TAKE 1 TABLET BY MOUTH EVERYDAY AT BEDTIME   tiZANidine (ZANAFLEX) 4 MG tablet TAKE 1 TABLET BY MOUTH EVERY DAY AS ONE DOSE   traMADol (ULTRAM) 50 MG tablet Take 1 tablet (50 mg total) by mouth every 8 (eight) hours as needed.   [DISCONTINUED] predniSONE (DELTASONE) 20 MG tablet Take 1 tablet (20 mg total) by mouth daily with breakfast.   [DISCONTINUED] rivaroxaban (XARELTO) 10 MG TABS tablet Take 1 tablet (10 mg total) by mouth daily.   [DISCONTINUED] Vitamin D, Ergocalciferol, (DRISDOL) 1.25 MG (50000 UNIT) CAPS capsule TAKE 1 CAPSULE (50,000 UNITS TOTAL) BY MOUTH EVERY 7 (SEVEN) DAYS     Allergies:   Penicillins and Influenza vaccines   ROS:   Please see the history of present illness.    All other systems reviewed and are negative.  EKGs/Labs/Other Studies Reviewed:    The following studies were reviewed today: Cardiac Studies & Procedures     STRESS TESTS  ECHOCARDIOGRAM STRESS TEST 04/05/2018  Narrative *[facility]* [address] [place], [place] [address] [phone]  ------------------------------------------------------------------- Stress Echocardiography  Patient:    Lashaun, Hymon MR #:       [id number] Study Date: 04/05/2018 Gender:     M Age:  66 Height:     177.8 cm Weight:     95.8 kg BSA:        2.2 m^2 Pt. Status: Room:  SONOGRAPHER  Vergia Alcon ATTENDING    Tonny Bollman, MD ORDERING     Tonny Bollman, MD REFERRING    Tonny Bollman, MD PERFORMING   [facility], Outpatient  cc:  -------------------------------------------------------------------  ------------------------------------------------------------------- Indications:      (R06.02).  ------------------------------------------------------------------- History:   PMH:   Dyspnea.  Risk factors:  Obese.  Dyslipidemia.  ------------------------------------------------------------------- Impressions:  - This is intrepreted as a negative stress echo. There is no evidence of ischemia. Normal LV function  ------------------------------------------------------------------- Study data:   Study status:  Routine.  Consent:  The risks, benefits, and alternatives to the procedure were explained to the patient and informed consent was obtained.  Procedure:  The patient reported no pain pre or post test. Initial setup. The patient was brought to the laboratory. A baseline ECG was recorded. Surface ECG leads and automatic cuff blood pressure measurements were monitored. Treadmill exercise testing was performed using the Bruce protocol. The patient exercised for 7 min 15 sec, to protocol stage 3, to a maximal work rate of 8.5 mets. Exercise was terminated due to fatigue. Transthoracic stress echocardiography for risk stratification. Images were captured at baseline and peak exercise. Study completion:  The patient tolerated the procedure well. There were no complications.          Bruce protocol. Stress echocardiography.  Birthdate:  Patient birthdate: 26-Mar-1951.  Age: Patient is 73 yr old.  Sex:  Gender: male.    BMI: 30.3 kg/m^2. Blood pressure:     130/85  Patient status:  Outpatient.  Study date:  Study date: 04/05/2018. Study time: 03:19 PM.  -------------------------------------------------------------------  ------------------------------------------------------------------- Stress protocol:  +---------------------+---+------------+--------+ !Stage                !HR !BP (mmHg)   !Symptoms! +---------------------+---+------------+--------+ !Baseline             !68 !130/85 (100)!None    ! +---------------------+---+------------+--------+ !Stage 1              !111!110/80 (90) !--------! +---------------------+---+------------+--------+ !Stage 2              !129!183/80  (114)!--------! +---------------------+---+------------+--------+ !Stage 3              !137!------------!Fatigue ! +---------------------+---+------------+--------+ !Immediate post stress!134!------------!--------! +---------------------+---+------------+--------+ !Recovery; 1 min      !78 !156/86 (109)!--------! +---------------------+---+------------+--------+ !Recovery; 2 min      !76 !------------!--------! +---------------------+---+------------+--------+ !Recovery; 3 min      !76 !165/83 (110)!--------! +---------------------+---+------------+--------+ !Recovery; 5 min      !75 !------------!--------! +---------------------+---+------------+--------+ !Recovery; 10 min     !---!130/84 (99) !--------! +---------------------+---+------------+--------+  ------------------------------------------------------------------- Stress results:   Maximal heart rate during stress was 137 bpm (90% of maximal predicted heart rate). The maximal predicted heart rate was 153 bpm.The target heart rate was achieved. The heart rate response to stress was normal. There was a normal resting blood pressure with an appropriate response to stress. The rate-pressure product for the peak heart rate and blood pressure was 16109 mm Hg/min.  The patient experienced no chest pain during stress.  ------------------------------------------------------------------- Stress ECG:  There were no ST or T wave changes to suggest ischemia .  ------------------------------------------------------------------- Baseline:  - LV global systolic function was normal. - Normal wall motion; no LV regional wall motion abnormalities.  Peak stress:  - LV global systolic function was vigorous. - Normal wall  motion; no LV regional wall motion abnormalities. - No evidence for new LV regional wall motion abnormalities.  ------------------------------------------------------------------- Measurements  Left ventricle                          Value        Reference LV ID, ED, PLAX chordal                45.1  mm     43 - 52 LV ID, ES, PLAX chordal                29.1  mm     23 - 38 LV fx shortening, PLAX chordal         35    %      >=29 LV PW thickness, ED                    10.8  mm     --------- IVS/LV PW ratio, ED                    1.28         <=1.3  Ventricular septum                     Value        Reference IVS thickness, ED                      13.8  mm     ---------  Aorta                                  Value        Reference Aortic root ID, ED                     36    mm     --------- Ascending aorta ID, A-P, S             30    mm     ---------  Left atrium                            Value        Reference LA ID, A-P, ES                         40    mm     --------- LA ID/bsa, A-P                         1.82  cm/m^2 <=2.2  Legend: (L)  and  (H)  mark values outside specified reference range.  ------------------------------------------------------------------- Prepared and Electronically Authenticated by  Kristeen Miss, M.D. 2019-04-24T17:28:02  ECHOCARDIOGRAM  ECHOCARDIOGRAM COMPLETE 07/31/2021  Narrative ECHOCARDIOGRAM REPORT    Patient Name:   [facility]AYAAN [facility] Date of Exam: 07/31/2021 Medical Rec #:  [id number]      Height:       70.0 in Accession #:    [id number]     Weight:       195.4 lb Date of Birth:  [birth year]      BSA:          2.067 m Patient Age:    17 years  BP:           94/64 mmHg Patient Gender: M              HR:           53 bpm. Exam Location:  [address]  Procedure: 2D Echo  Indications:    Dyspnea R06.00  History:        Patient has no prior history of Echocardiogram examinations. Risk Factors:Dyslipidemia.  Sonographer:    Thurman Coyer RDCS Referring Phys: [id number] WEAVER  IMPRESSIONS   1. Left ventricular ejection fraction, by estimation, is 60 to 65%. The left ventricle has normal function. The left ventricle has no regional  wall motion abnormalities. There is mild left ventricular hypertrophy. Left ventricular diastolic parameters were normal. 2. Right ventricular systolic function is low normal. The right ventricular size is normal. 3. The mitral valve is normal in structure. No evidence of mitral valve regurgitation. 4. The aortic valve is tricuspid. Aortic valve regurgitation is trivial. Mild aortic valve sclerosis is present, with no evidence of aortic valve stenosis.  Comparison(s): No prior Echocardiogram.  FINDINGS Left Ventricle: Left ventricular ejection fraction, by estimation, is 60 to 65%. The left ventricle has normal function. The left ventricle has no regional wall motion abnormalities. The left ventricular internal cavity size was normal in size. There is mild left ventricular hypertrophy. Left ventricular diastolic parameters were normal.  Right Ventricle: The right ventricular size is normal. No increase in right ventricular wall thickness. Right ventricular systolic function is low normal.  Left Atrium: Left atrial size was normal in size.  Right Atrium: Right atrial size was normal in size.  Pericardium: There is no evidence of pericardial effusion.  Mitral Valve: The mitral valve is normal in structure. No evidence of mitral valve regurgitation.  Tricuspid Valve: The tricuspid valve is grossly normal. Tricuspid valve regurgitation is trivial.  Aortic Valve: The aortic valve is tricuspid. Aortic valve regurgitation is trivial. Mild aortic valve sclerosis is present, with no evidence of aortic valve stenosis.  Pulmonic Valve: The pulmonic valve was normal in structure. Pulmonic valve regurgitation is not visualized.  Aorta: The aortic root and ascending aorta are structurally normal, with no evidence of dilitation.  IAS/Shunts: No atrial level shunt detected by color flow Doppler.   LEFT VENTRICLE PLAX 2D LVIDd:         4.90 cm     Diastology LVIDs:         3.10 cm     LV e'  medial:    5.92 cm/s LV PW:         1.10 cm     LV E/e' medial:  12.0 LV IVS:        1.10 cm     LV e' lateral:   10.10 cm/s LVOT diam:     2.20 cm     LV E/e' lateral: 7.0 LV SV:         94 LV SV Index:   45 LVOT Area:     3.80 cm  LV Volumes (MOD) LV vol d, MOD A2C: 80.1 ml LV vol d, MOD A4C: 74.2 ml LV vol s, MOD A2C: 34.0 ml LV vol s, MOD A4C: 24.8 ml LV SV MOD A2C:     46.1 ml LV SV MOD A4C:     74.2 ml LV SV MOD BP:      50.3 ml  RIGHT VENTRICLE RV S prime:     10.60 cm/s TAPSE (M-mode):  1.7 cm  LEFT ATRIUM             Index       RIGHT ATRIUM           Index LA diam:        3.50 cm 1.69 cm/m  RA Area:     18.10 cm LA Vol (A2C):   48.3 ml 23.37 ml/m RA Volume:   52.60 ml  25.45 ml/m LA Vol (A4C):   59.1 ml 28.60 ml/m LA Biplane Vol: 56.4 ml 27.29 ml/m AORTIC VALVE LVOT Vmax:   100.00 cm/s LVOT Vmean:  64.500 cm/s LVOT VTI:    0.247 m  AORTA Ao Root diam: 2.80 cm Ao Asc diam:  3.40 cm  MITRAL VALVE MV Area (PHT): 3.06 cm    SHUNTS MV Decel Time: 248 msec    Systemic VTI:  0.25 m MV E velocity: 71.10 cm/s  Systemic Diam: 2.20 cm MV A velocity: 59.60 cm/s MV E/A ratio:  1.19  Zoila Shutter MD Electronically signed by Zoila Shutter MD Signature Date/Time: 07/31/2021/12:37:59 PM    Final             EKG:        Recent Labs: No results found for requested labs within last 365 days.  Recent Lipid Panel    Component Value Date/Time   CHOL 167 03/05/2021 1149   TRIG 84 03/05/2021 1149   HDL 56 03/05/2021 1149   CHOLHDL 3.0 03/05/2021 1149   CHOLHDL 3 04/08/2014 1006   VLDL 15.8 04/08/2014 1006   LDLCALC 95 03/05/2021 1149     Risk Assessment/Calculations:                Physical Exam:    VS:  BP 120/76   Pulse 65   Ht 5\' 10"  (1.778 m)   Wt 193 lb (87.5 kg)   SpO2 97%   BMI 27.69 kg/m     Wt Readings from Last 3 Encounters:  12/29/23 193 lb (87.5 kg)  12/09/23 198 lb 3.2 oz (89.9 kg)  12/05/23 196 lb 11.2 oz (89.2 kg)      GEN:  Well nourished, well developed in no acute distress HEENT: Normal NECK: No JVD; No carotid bruits LYMPHATICS: No lymphadenopathy CARDIAC: RRR, no murmurs, rubs, gallops RESPIRATORY:  Clear to auscultation without rales, wheezing or rhonchi  ABDOMEN: Soft, non-tender, non-distended MUSCULOSKELETAL:  No edema; No deformity  SKIN: Warm and dry NEUROLOGIC:  Alert and oriented x 3 PSYCHIATRIC:  Normal affect   Assessment & Plan Shortness of breath Prior echo reviewed demonstrating normal LVEF with no significant valvular disease.  Recommend gated coronary CT-FFR for evaluation of ischemic heart disease as a potential cause of his exertional dyspnea is an anginal equivalent.  Will check renal function prior to administration of contrast with a metabolic panel today. 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  I went back and reviewed his DVT history.  He had some chronic changes from last duplex in 2019.  He has no new symptoms, no progressive edema, and I think at this point he could stop taking rivaroxaban so that he could use nonsteroidals a little more liberally.  I cautioned him about the potential side effects and problems that regular NSAID use can lead to.  They understand this.  They will keep an eye on his legs to make sure that there is no evidence of recurrent DVT  since he is going to stop rivaroxaban. Mixed hyperlipidemia LDL cholesterol is greater than 100.  He remains on atorvastatin and ezetimibe.  Will update lipids and LFTs today.  Will further address his LDL goal based on the presence or absence of coronary artery disease on his upcoming coronary CTA. Dyspnea on exertion As outlined above Pre-procedural cardiovascular examination Check labs before CTA study Effort angina [facility]) Evaluating whether his exertional dyspnea is anginal equivalent as above       Medication Adjustments/Labs and Tests Ordered: Current medicines are reviewed at length with the patient today.  Concerns regarding medicines are outlined above.  Orders Placed This Encounter  Procedures   CT CORONARY MORPH W/CTA COR W/SCORE W/CA W/CM &/OR WO/CM   Comprehensive metabolic panel   Lipid panel   No orders of the defined types were placed in this encounter.   Patient Instructions  Medication Instructions:  STOP Xarelto *If you need a refill on your cardiac medications before your next appointment, please call your pharmacy*  Testing/Procedures: Coronary CT Angiogram Your physician has requested that you have cardiac CT. Cardiac computed tomography (CT) is a painless test that uses an x-ray machine to take clear, detailed pictures of your heart. For further information please visit [email or web address] Please follow instruction sheet as given.   Follow-Up: At [facility], you and your health needs are our priority.  As part of our continuing mission to provide you with exceptional heart care, we have created designated Provider Care Teams.  These Care Teams include your primary Cardiologist (physician) and Advanced Practice Providers (APPs -  Physician Assistants and Nurse Practitioners) who all work together to provide you with the care you need, when you need it.  Your next appointment:   1 year(s)  Provider:   Tonny Bollman, MD       Other Instructions   Your cardiac CT will be scheduled at one of the below locations:   [facility] [address] [place], [place] [address] [phone]  please arrive at the [facility] and Children's Entrance (Entrance C2) of [facility] 30 minutes prior to test start time. You can use the FREE valet parking offered at entrance C (encouraged to control the heart rate for the test)  Proceed to the [facility] Radiology Department (first floor) to check-in and test prep.  All radiology patients  and guests should use entrance C2 at [facility], accessed from [facility], even though the hospital's physical address listed is [address]    Please follow these instructions carefully (unless otherwise directed):  An IV will be required for this test and Nitroglycerin will be given.  Hold all erectile dysfunction medications at least 3 days (72 hrs) prior to test. (Ie viagra, cialis, sildenafil, tadalafil, etc)   On the Night Before the Test: Be sure to Drink plenty of water. Do not consume any caffeinated/decaffeinated beverages or chocolate 12 hours prior to your test. Do not take any antihistamines 12 hours prior to your test.  On the Day of the Test: Drink plenty of water until 1 hour prior to the test. Do not eat any food 1 hour prior to test. You may take your regular medications prior to the test.  If you take Furosemide/Hydrochlorothiazide/Spironolactone/Chlorthalidone, please HOLD on the morning of the test. Patients who wear a continuous glucose monitor MUST remove the device prior to scanning.      After the Test: Drink plenty of water. After receiving IV  contrast, you may experience a mild flushed feeling. This is normal. On occasion, you may experience a mild rash up to 24 hours after the test. This is not dangerous. If this occurs, you can take Benadryl 25 mg and increase your fluid intake. If you experience trouble breathing, this can be serious. If it is severe call 911 IMMEDIATELY. If it is mild, please call our office.  We will call to schedule your test 2-4 weeks out understanding that some insurance companies will need an authorization prior to the service being performed.   For more information and frequently asked questions, please visit our website : [email or web address]  For non-scheduling related questions, please contact the cardiac imaging nurse navigator should you have any questions/concerns: Cardiac  Imaging Nurse Navigators Direct Office Dial: [phone]   For scheduling needs, including cancellations and rescheduling, please call [place], [phone].      1st Floor: - Lobby - Registration  - Pharmacy  - Lab - Cafe  2nd Floor: - PV Lab - Diagnostic Testing (echo, CT, nuclear med)  3rd Floor: - Vacant  4th Floor: - TCTS (cardiothoracic surgery) - AFib Clinic - Structural Heart Clinic - Vascular Surgery  - Vascular Ultrasound  5th Floor: - HeartCare Cardiology (general and EP) - Clinical Pharmacy for coumadin, hypertension, lipid, weight-loss medications, and med management appointments    Valet parking services will be available as well.     Signed, Tonny Bollman, MD  12/29/2023 4:53 PM    [facility]

## 2023-12-30 ENCOUNTER — Encounter (HOSPITAL_COMMUNITY)
Admission: RE | Admit: 2023-12-30 | Discharge: 2023-12-30 | Disposition: A | Discharge status: Home / Self Care | Payer: Medicare Other | Source: Ambulatory Visit | Attending: Orthopedic Surgery | Admitting: Orthopedic Surgery

## 2023-12-30 DIAGNOSIS — Z96652 Presence of left artificial knee joint: Secondary | ICD-10-CM | POA: Insufficient documentation

## 2023-12-30 LAB — COMPREHENSIVE METABOLIC PANEL
ALT: 27 [IU]/L (ref 0–44)
AST: 27 [IU]/L (ref 0–40)
Albumin: 4.3 g/dL (ref 3.8–4.8)
Alkaline Phosphatase: 57 [IU]/L (ref 44–121)
BUN/Creatinine Ratio: 17 (ref 10–24)
BUN: 18 mg/dL (ref 8–27)
Bilirubin Total: 0.5 mg/dL (ref 0.0–1.2)
CO2: 25 mmol/L (ref 20–29)
Calcium: 9.5 mg/dL (ref 8.6–10.2)
Chloride: 102 mmol/L (ref 96–106)
Creatinine, Ser: 1.05 mg/dL (ref 0.76–1.27)
Globulin, Total: 2.6 g/dL (ref 1.5–4.5)
Glucose: 93 mg/dL (ref 70–99)
Potassium: 4.9 mmol/L (ref 3.5–5.2)
Sodium: 141 mmol/L (ref 134–144)
Total Protein: 6.9 g/dL (ref 6.0–8.5)
eGFR: 75 mL/min/{1.73_m2} (ref >59)

## 2023-12-30 LAB — LIPID PANEL
Chol/HDL Ratio: 2.8 {ratio} (ref 0.0–5.0)
Cholesterol, Total: 143 mg/dL (ref 100–199)
HDL: 51 mg/dL (ref >39)
LDL Chol Calc (NIH): 75 mg/dL (ref 0–99)
Triglycerides: 90 mg/dL (ref 0–149)
VLDL Cholesterol Cal: 17 mg/dL (ref 5–40)

## 2023-12-30 MED ORDER — TECHNETIUM TC 99M MEDRONATE IV KIT
22.0000 | PACK | Freq: Once | INTRAVENOUS | Status: AC | PRN
  Administered 2023-12-30: 22 via INTRAVENOUS

## 2024-01-04 NOTE — Progress Notes (Signed)
[name] [address] [place] [address] Phone: [phone] Subjective:   Andrew [name], am serving as a scribe for Dr. Antoine Primas.  I'm seeing this patient by the request  of:  Tracey Harries, MD  CC: Left knee pain  ION:[id number]  03/24/2023 Interval noted.  Discussed icing regimen and home exercises.  Increase activity slowly.  I believe that the bone stimulator was helpful but not completely.  At this point though I do think that this is as good as it is potentially going to get.     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  Hold on any type of aspiration.  Hopeful that this will just continue to improve.  Follow-up with me again when he returns from home.      Update 01/05/2024 Andrew [name] is a 73 y.o. male coming in with complaint of L knee pain.  Patient unfortunately did have a postsurgical fracture of the patella after a knee replacement.  Continues to have pain and swelling.  Patient states that his knee is swollen and in pain for a couple of months.   Previous bone scan did show that patient did have questionable septic arthritis for possible infection of the knee.     Past Medical History:  Diagnosis Date   Chronic headaches    Colon polyps    DVT (deep venous thrombosis) (HCC)    right leg in 2011; prior injury to right foot   Echocardiogram 07/2021   Echocardiogram 8/22: EF 60-65, no RWMA, mild LVH, low normal RVSF, trivial AI, mild AV sclerosis w/o AS   HLD (hyperlipidemia)    Right foot injury    Past Surgical History:  Procedure Laterality Date   HERNIA REPAIR     NO PAST SURGERIES     Social History   Socioeconomic History   Marital status: Divorced    Spouse name: Not on file   Number of children: 0   Years of education: Not on file   Highest education level: Not on file  Occupational History   Occupation: retired  Tobacco Use   Smoking status: Never   Smokeless tobacco: Never    Tobacco comments:    REMOTE SOCIAL HISTORY  Vaping Use   Vaping status: Never Used  Substance and Sexual Activity   Alcohol use: No   Drug use: No   Sexual activity: Not Currently  Other Topics Concern   Not on file  Social History Narrative   Not on file   Social Drivers of Health   Financial Resource Strain: Low Risk  (11/18/2023)   Received from [facility]   Overall Financial Resource Strain (CARDIA)    Difficulty of Paying Living Expenses: Not hard at all  Food Insecurity: No Food Insecurity (11/18/2023)   Received from [facility]   Hunger Vital Sign    Worried About Running Out of Food in the Last Year: Never true    Ran Out of Food in the Last Year: Never true  Transportation Needs: No Transportation Needs (11/18/2023)   Received from [facility] - Transportation    Lack of Transportation (Medical): No    Lack of Transportation (Non-Medical): No  Physical Activity: Unknown (11/18/2023)   [facility] from [facility]   Exercise Vital Sign    Days of Exercise per Week: 0 days    Minutes of Exercise per Session: Not on file  Stress: Stress Concern Present (11/18/2023)  Received from [facility] - Occupational Stress Questionnaire    Feeling of Stress : Very much  Social Connections: Socially Integrated (11/18/2023)   Received from [facility]   Social Network    How would you rate your social network (family, work, friends)?: Good participation with social networks   Allergies  Allergen Reactions   Penicillins Anaphylaxis   Influenza Vaccines     Broke out in hives and had breathing problems   Family History  Problem Relation Age of Onset   Heart disease Mother    Ovarian cancer Mother    Uterine cancer Mother    Heart disease Father    Prostate cancer Brother    Colon polyps Brother    Vascular Disease Maternal Grandfather    Neuropathy Neg Hx    Stomach cancer Neg Hx    Esophageal cancer Neg Hx     Colon cancer Neg Hx    Rectal cancer Neg Hx      Current Outpatient Medications (Cardiovascular):    atorvastatin (LIPITOR) 40 MG tablet, Take 1 tablet (40 mg total) by mouth daily.   ezetimibe (ZETIA) 10 MG tablet, Take 1 tablet (10 mg total) by mouth daily.  Current Outpatient Medications (Respiratory):    albuterol (VENTOLIN HFA) 108 (90 Base) MCG/ACT inhaler, Inhale 2 puffs into the lungs every 6 (six) hours as needed for wheezing or shortness of breath.   BREO ELLIPTA 100-25 MCG/ACT AEPB, INHALE 1 PUFF INTO THE LUNGS DAILY   montelukast (SINGULAIR) 10 MG tablet, TAKE 1 TABLET BY MOUTH EVERYDAY AT BEDTIME  Current Outpatient Medications (Analgesics):    allopurinol (ZYLOPRIM) 100 MG tablet, TAKE 2 TABLETS BY MOUTH EVERY DAY (Patient taking differently: as needed.)   meloxicam (MOBIC) 15 MG tablet, Take 1 tablet by mouth daily.   traMADol (ULTRAM) 50 MG tablet, Take 1 tablet (50 mg total) by mouth every 8 (eight) hours as needed.   Current Outpatient Medications (Other):    cyclobenzaprine (FLEXERIL) 10 MG tablet, Take 1 tablet (10 mg total) by mouth 3 (three) times daily as needed for muscle spasms.   diclofenac Sodium (VOLTAREN) 1 % GEL, Place onto the skin.   gabapentin (NEURONTIN) 300 MG capsule, Take 300 mg by mouth as needed.   tiZANidine (ZANAFLEX) 4 MG tablet, TAKE 1 TABLET BY MOUTH EVERY DAY AS ONE DOSE   Reviewed prior external information including notes and imaging from  primary care provider As well as notes that were available from care everywhere and other healthcare systems.  Past medical history, social, surgical and family history all reviewed in electronic medical record.  No pertanent information unless stated regarding to the chief complaint.   Patient has been seen by cardiology recently secondary to shortness of breath.  Review of Systems:  No headache, visual changes, nausea, vomiting, diarrhea, constipation, dizziness, abdominal pain, skin rash,  fevers, chills, night sweats, weight loss, swollen lymph nodes, body aches, joint swelling, chest pain, shortness of breath, mood changes. POSITIVE muscle aches  Objective  Blood pressure 138/80, pulse 64, height 5\' 10"  (1.778 m), weight 193 lb (87.5 kg), SpO2 98%.   General: No apparent distress alert and oriented x3 mood and affect normal, dressed appropriately.  HEENT: Pupils equal, extraocular movements intact  Respiratory: Patient's speak in full sentences and does not appear short of breath  Cardiovascular: No lower extremity edema, non tender, no erythema  Antalgic gait noted.  Patient does have instability of the knee with  audible clicking.  Tender to palpation noted.   Limited muscular skeletal ultrasound was performed and interpreted by Antoine Primas, M  Limited ultrasound shows still hypoechoic changes in the patellofemoral area that is consistent with joint effusion.  Patient does have significant increase in neovascularization in Doppler flow even in the soft tissue.  A cortical irregularity with greater than 1-1/2 cm of displacement of the patella inferiorly still noted.   Impression and Recommendations:    The above documentation has been reviewed and is accurate and complete Judi Saa, DO

## 2024-01-05 ENCOUNTER — Other Ambulatory Visit: Payer: Self-pay

## 2024-01-05 ENCOUNTER — Ambulatory Visit: Payer: Medicare Other | Admitting: Family Medicine

## 2024-01-05 VITALS — BP 138/80 | HR 64 | Ht 70.0 in | Wt 193.0 lb

## 2024-01-05 DIAGNOSIS — E559 Vitamin D deficiency, unspecified: Secondary | ICD-10-CM

## 2024-01-05 DIAGNOSIS — M2550 Pain in unspecified joint: Secondary | ICD-10-CM

## 2024-01-05 DIAGNOSIS — M25462 Effusion, left knee: Secondary | ICD-10-CM | POA: Diagnosis not present

## 2024-01-05 DIAGNOSIS — S82092K Other fracture of left patella, subsequent encounter for closed fracture with nonunion: Secondary | ICD-10-CM

## 2024-01-05 LAB — IBC PANEL
Iron: 104 ug/dL (ref 42–165)
Saturation Ratios: 29.1 % (ref 20.0–50.0)
TIBC: 357 ug/dL (ref 250.0–450.0)
Transferrin: 255 mg/dL (ref 212.0–360.0)

## 2024-01-05 LAB — CBC WITH DIFFERENTIAL/PLATELET
Basophils Absolute: 0 10*3/uL (ref 0.0–0.1)
Basophils Relative: 0.7 % (ref 0.0–3.0)
Eosinophils Absolute: 0.1 10*3/uL (ref 0.0–0.7)
Eosinophils Relative: 1.3 % (ref 0.0–5.0)
HCT: 46.8 % (ref 39.0–52.0)
Hemoglobin: 15.3 g/dL (ref 13.0–17.0)
Lymphocytes Relative: 16 % (ref 12.0–46.0)
Lymphs Abs: 0.9 10*3/uL (ref 0.7–4.0)
MCHC: 32.7 g/dL (ref 30.0–36.0)
MCV: 91.1 fL (ref 78.0–100.0)
Monocytes Absolute: 0.4 10*3/uL (ref 0.1–1.0)
Monocytes Relative: 6.2 % (ref 3.0–12.0)
Neutro Abs: 4.5 10*3/uL (ref 1.4–7.7)
Neutrophils Relative %: 75.8 % (ref 43.0–77.0)
Platelets: 266 10*3/uL (ref 150.0–400.0)
RBC: 5.14 Mil/uL (ref 4.22–5.81)
RDW: 13.2 % (ref 11.5–15.5)
WBC: 5.9 10*3/uL (ref 4.0–10.5)

## 2024-01-05 LAB — VITAMIN D 25 HYDROXY (VIT D DEFICIENCY, FRACTURES): VITD: 45.69 ng/mL (ref 30.00–100.00)

## 2024-01-05 LAB — COMPREHENSIVE METABOLIC PANEL
ALT: 28 U/L (ref 0–53)
AST: 25 U/L (ref 0–37)
Albumin: 4.4 g/dL (ref 3.5–5.2)
Alkaline Phosphatase: 46 U/L (ref 39–117)
BUN: 20 mg/dL (ref 6–23)
CO2: 28 meq/L (ref 19–32)
Calcium: 9.6 mg/dL (ref 8.4–10.5)
Chloride: 105 meq/L (ref 96–112)
Creatinine, Ser: 1.03 mg/dL (ref 0.40–1.50)
GFR: 72.03 mL/min (ref >60.00)
Glucose, Bld: 70 mg/dL (ref 70–99)
Potassium: 4.4 meq/L (ref 3.5–5.1)
Sodium: 142 meq/L (ref 135–145)
Total Bilirubin: 0.7 mg/dL (ref 0.2–1.2)
Total Protein: 7.4 g/dL (ref 6.0–8.3)

## 2024-01-05 LAB — FERRITIN: Ferritin: 112.7 ng/mL (ref 22.0–322.0)

## 2024-01-05 LAB — URIC ACID: Uric Acid, Serum: 4.2 mg/dL (ref 4.0–7.8)

## 2024-01-05 LAB — SEDIMENTATION RATE: Sed Rate: 26 mm/h — ABNORMAL HIGH (ref 0–20)

## 2024-01-05 LAB — C-REACTIVE PROTEIN: CRP: <1 mg/dL (ref 0.5–20.0)

## 2024-01-05 NOTE — Patient Instructions (Signed)
Labs today

## 2024-01-06 ENCOUNTER — Encounter: Payer: Self-pay | Admitting: Family Medicine

## 2024-01-07 ENCOUNTER — Encounter: Payer: Self-pay | Admitting: Family Medicine

## 2024-01-07 LAB — RHEUMATOID FACTOR: Rheumatoid fact SerPl-aCnc: 10 [IU]/mL (ref <14)

## 2024-01-07 LAB — ANA: Anti Nuclear Antibody (ANA): NEGATIVE

## 2024-01-07 LAB — PTH, INTACT AND CALCIUM
Calcium: 9.9 mg/dL (ref 8.6–10.3)
PTH: 26 pg/mL (ref 16–77)

## 2024-01-07 NOTE — Assessment & Plan Note (Addendum)
Patient does have a patella fracture noted.  Patient does have pain noted with knee replacement.  I do believe the patient does have significant instability also noted.  Bone scan shows questionable septic arthritis versus possible chronic synovitis.  Ultrasound today even shows still increasing neovascularization in Doppler flow as well as an effusion of the replacement.  Discussed with patient as well as with his family.  Patient is frustrated and is having pain on a daily basis, affecting daily activities as well as different things such as travel.  Unfortunately causing significant increase in stress which is also a social determinant of health.  We discussed at this point that likely to make some difference we are looking at some type of invasive procedure.  Patient will be traveling to [facility] to discuss other treatment options and to see what is best.  We will forward our note today including care everywhere for the reports of the imaging.  We discussed with them at great length that we are here for them if they have any other questions but from a primary care sports medicine role do not have any significant other treatment options available.  Total time discussing with patient and his family 33 minutes  Labs ordered today to see if there is any titration up of the sedimentation rate.  Also will do autoimmune labs to make sure there is no underlying pathology that could be continuing with some synovitis or delaying any healing aspect.

## 2024-01-08 ENCOUNTER — Encounter: Payer: Self-pay | Admitting: Family Medicine

## 2024-01-11 ENCOUNTER — Ambulatory Visit: Payer: Medicare Other | Admitting: Family Medicine

## 2024-01-12 ENCOUNTER — Ambulatory Visit: Payer: Medicare Other | Admitting: Cardiovascular Disease

## 2024-01-23 ENCOUNTER — Encounter (HOSPITAL_COMMUNITY): Payer: Self-pay

## 2024-01-25 ENCOUNTER — Other Ambulatory Visit (HOSPITAL_COMMUNITY): Payer: Self-pay

## 2024-01-25 ENCOUNTER — Ambulatory Visit (HOSPITAL_COMMUNITY)
Admission: RE | Admit: 2024-01-25 | Discharge: 2024-01-25 | Disposition: A | Discharge status: Home / Self Care | Payer: Medicare Other | Source: Ambulatory Visit | Attending: Cardiovascular Disease | Admitting: Cardiovascular Disease

## 2024-01-25 DIAGNOSIS — I2089 Other forms of angina pectoris: Secondary | ICD-10-CM | POA: Insufficient documentation

## 2024-01-25 DIAGNOSIS — R0609 Other forms of dyspnea: Secondary | ICD-10-CM | POA: Insufficient documentation

## 2024-01-25 MED ORDER — NITROGLYCERIN 0.4 MG SL SUBL
SUBLINGUAL_TABLET | SUBLINGUAL | Status: AC
  Filled 2024-01-25: qty 2

## 2024-01-25 MED ORDER — NITROGLYCERIN 0.4 MG SL SUBL
0.8000 mg | SUBLINGUAL_TABLET | Freq: Once | SUBLINGUAL | Status: AC
  Administered 2024-01-25: 0.8 mg via SUBLINGUAL

## 2024-01-25 MED ORDER — IOHEXOL 350 MG/ML SOLN
100.0000 mL | Freq: Once | INTRAVENOUS | Status: AC | PRN
  Administered 2024-01-25: 100 mL via INTRAVENOUS

## 2024-01-27 ENCOUNTER — Other Ambulatory Visit (HOSPITAL_BASED_OUTPATIENT_CLINIC_OR_DEPARTMENT_OTHER): Payer: Self-pay | Admitting: Pulmonary Disease

## 2024-03-02 DIAGNOSIS — H25043 Posterior subcapsular polar age-related cataract, bilateral: Secondary | ICD-10-CM | POA: Insufficient documentation

## 2024-03-02 DIAGNOSIS — H04221 Epiphora due to insufficient drainage, right lacrimal gland: Secondary | ICD-10-CM | POA: Insufficient documentation

## 2024-03-26 DIAGNOSIS — M25561 Pain in right knee: Secondary | ICD-10-CM | POA: Insufficient documentation

## 2024-04-16 DIAGNOSIS — H25012 Cortical age-related cataract, left eye: Secondary | ICD-10-CM | POA: Insufficient documentation

## 2024-04-16 DIAGNOSIS — H25011 Cortical age-related cataract, right eye: Secondary | ICD-10-CM | POA: Insufficient documentation

## 2024-04-16 DIAGNOSIS — H25041 Posterior subcapsular polar age-related cataract, right eye: Secondary | ICD-10-CM | POA: Insufficient documentation

## 2024-04-16 DIAGNOSIS — H25042 Posterior subcapsular polar age-related cataract, left eye: Secondary | ICD-10-CM | POA: Insufficient documentation

## 2024-05-21 ENCOUNTER — Emergency Department (HOSPITAL_BASED_OUTPATIENT_CLINIC_OR_DEPARTMENT_OTHER)

## 2024-05-21 ENCOUNTER — Encounter (HOSPITAL_BASED_OUTPATIENT_CLINIC_OR_DEPARTMENT_OTHER): Payer: Self-pay

## 2024-05-21 ENCOUNTER — Emergency Department (HOSPITAL_BASED_OUTPATIENT_CLINIC_OR_DEPARTMENT_OTHER)
Admission: EM | Admit: 2024-05-21 | Discharge: 2024-05-21 | Disposition: A | Discharge status: Home / Self Care | Attending: Emergency Medicine | Admitting: Emergency Medicine

## 2024-05-21 ENCOUNTER — Other Ambulatory Visit: Payer: Self-pay

## 2024-05-21 DIAGNOSIS — S0081XA Abrasion of other part of head, initial encounter: Secondary | ICD-10-CM | POA: Insufficient documentation

## 2024-05-21 DIAGNOSIS — S0990XA Unspecified injury of head, initial encounter: Secondary | ICD-10-CM

## 2024-05-21 DIAGNOSIS — Y93H2 Activity, gardening and landscaping: Secondary | ICD-10-CM | POA: Insufficient documentation

## 2024-05-21 DIAGNOSIS — W228XXA Striking against or struck by other objects, initial encounter: Secondary | ICD-10-CM | POA: Insufficient documentation

## 2024-05-21 MED ORDER — ACETAMINOPHEN 325 MG PO TABS
650.0000 mg | ORAL_TABLET | Freq: Once | ORAL | Status: AC
  Administered 2024-05-21: 650 mg via ORAL
  Filled 2024-05-21: qty 2

## 2024-05-21 NOTE — Discharge Instructions (Signed)
 Your workup today was reassuring.  CT scan of your head and cervical spine did not show any brain bleed, broken or dislocated bones.  May take Tylenol , ibuprofen  for treatment of pain as well as ice the area for help with swelling.  Recommend follow-up with your primary care for reassessment.  Please do not hesitate to return to the emergency department if the worrisome signs and symptoms we discussed become apparent.

## 2024-05-21 NOTE — ED Triage Notes (Signed)
 Pt c/o abrasion/ avulsion to scalp just PTA. -LOC, +HA. Bleeding controlled at time of triage, dressing applied.

## 2024-05-21 NOTE — ED Provider Notes (Signed)
 [facility] EMERGENCY DEPARTMENT AT [facility] Provider Note   CSN: [id number] Arrival date & time: 05/21/24  1553     History  No chief complaint on file.   Andrew [name] is a 73 y.o. male.  HPI   73 year old male presents emergency department with complaints of head injury.  Patient was cutting down branches of some of the branches fell bouncing off the ground and struck him in the head.  Denies LOC, blood thinner use.  Denies falling subsequently or injuring anything else.  States that Andrew [name] has had swelling as well as headache on top of his head.  Did notice a abrasion on the top of his head of which was cleaned at home with iodine.  Denies any visual disturbance, gait abnormality, slurred speech, facial droop, weakness or sensory deficit of lower extremities.  Denies any pain elsewhere.  Past medical history significant for hyperlipidemia, DVT, osteoarthritis, chronic venous insufficiency  Home Medications Prior to Admission medications   Medication Sig Start Date End Date Taking? Authorizing Provider  albuterol  (VENTOLIN  HFA) 108 (90 Base) MCG/ACT inhaler TAKE 2 PUFFS BY MOUTH EVERY 6 HOURS AS NEEDED FOR WHEEZE OR SHORTNESS OF BREATH 01/27/24   Quillian Brunt, MD  allopurinol  (ZYLOPRIM ) 100 MG tablet TAKE 2 TABLETS BY MOUTH EVERY DAY Patient taking differently: as needed. 07/02/22   Isidro Margo, DO  atorvastatin  (LIPITOR) 40 MG tablet Take 1 tablet (40 mg total) by mouth daily. 03/30/23   Lambert, Jennifer K, PA-C  BREO ELLIPTA  100-25 MCG/ACT AEPB INHALE 1 PUFF INTO THE LUNGS DAILY 06/28/22   Quillian Brunt, MD  cyclobenzaprine  (FLEXERIL ) 10 MG tablet Take 1 tablet (10 mg total) by mouth 3 (three) times daily as needed for muscle spasms. 12/31/21   Nitka, James E, MD  diclofenac  Sodium (VOLTAREN ) 1 % GEL Place onto the skin.    [provider]  ezetimibe  (ZETIA ) 10 MG tablet Take 1 tablet (10 mg total) by mouth daily. 03/30/23 06/28/23  Conan December,  PA-C  gabapentin  (NEURONTIN ) 300 MG capsule Take 300 mg by mouth as needed.    [provider]  meloxicam  (MOBIC ) 15 MG tablet Take 1 tablet by mouth daily.    [provider]  montelukast  (SINGULAIR ) 10 MG tablet TAKE 1 TABLET BY MOUTH EVERYDAY AT BEDTIME 05/09/23   Smith, Zachary M, DO  tiZANidine  (ZANAFLEX ) 4 MG tablet TAKE 1 TABLET BY MOUTH EVERY DAY AS ONE DOSE 09/09/23   Bridget Campion, MD  traMADol  (ULTRAM ) 50 MG tablet Take 1 tablet (50 mg total) by mouth every 8 (eight) hours as needed. 12/31/21   Nitka, James E, MD      Allergies    Penicillins and Influenza vaccines    Review of Systems   Review of Systems  All other systems reviewed and are negative.   Physical Exam Updated Vital Signs BP 119/80   Pulse 73   Temp 98.2 F (36.8 C)   Resp 16   SpO2 96%  Physical Exam Vitals and nursing note reviewed.  Constitutional:      General: Andrew [name] is not in acute distress.    Appearance: Andrew [name] is well-developed.  HENT:     Head: Normocephalic.     Comments: Superficial abrasion appreciated just right of midline forehead region.  Soft tissue swelling present.  Area tender to the touch. Eyes:     Conjunctiva/sclera: Conjunctivae normal.  Cardiovascular:     Rate and Rhythm: Normal rate and regular rhythm.  Heart sounds: No murmur heard. Pulmonary:     Effort: Pulmonary effort is normal. No respiratory distress.     Breath sounds: Normal breath sounds.  Abdominal:     Palpations: Abdomen is soft.     Tenderness: There is no abdominal tenderness.  Musculoskeletal:        General: No swelling.     Cervical back: Neck supple.     Comments: No midline tenderness cervical, thoracic, lumbar spine without step-off or deformity.  No chest wall tenderness.  Full range of motion of bilateral upper lower extremities without overlying tenderness.  Skin:    General: Skin is warm and dry.     Capillary Refill: Capillary refill takes less than 2 seconds.  Neurological:      Mental Status: Andrew [name] is alert.     Comments: Alert and oriented to self, place, time and event.   Speech is fluent, clear without dysarthria or dysphasia.   Strength 5/5 in upper/lower extremities   Sensation intact in upper/lower extremities   Normal gait.  CN I not tested  CN II not tested CN III, IV, VI PERRLA and EOMs intact bilaterally  CN V Intact sensation to sharp and light touch to the face  CN VII facial movements symmetric  CN VIII not tested  CN IX, X no uvula deviation, symmetric rise of soft palate  CN XI 5/5 SCM and trapezius strength bilaterally  CN XII Midline tongue protrusion, symmetric L/R movements     Psychiatric:        Mood and Affect: Mood normal.     ED Results / Procedures / Treatments   Labs (all labs ordered are listed, but only abnormal results are displayed) Labs Reviewed - No data to display  EKG None  Radiology No results found.  Procedures Procedures    Medications Ordered in ED Medications - No data to display  ED Course/ Medical Decision Making/ A&P                                 Medical Decision Making Amount and/or Complexity of Data Reviewed Radiology: ordered.  Risk OTC drugs.   This patient presents to the ED for concern of head injury, this involves an extensive number of treatment options, and is a complaint that carries with it a high risk of complications and morbidity.  The differential diagnosis includes laceration, fracture, strain/pain, dislocation, CVA, other   Co morbidities that complicate the patient evaluation  See HPI   Additional history obtained:  Additional history obtained from EMR External records from outside source obtained and reviewed including hospital records   Lab Tests:  N/a   Imaging Studies ordered:  I ordered imaging studies including CT head/cervical spine  I independently visualized and interpreted imaging which showed no acute intracranial abnormality.  Mild atrophy.   No acute fracture or traumatic septation of the cervical spine.  Degenerative changes. I agree with the radiologist interpretation   Cardiac Monitoring: / EKG:  N/a   Consultations Obtained:  N/a   Problem List / ED Course / Critical interventions / Medication management  Head injury I ordered medication including Tylenol    Reevaluation of the patient after these medicines showed that the patient improved I have reviewed the patients home medicines and have made adjustments as needed   Social Determinants of Health:  Denies tobacco, licit drug use.   Test / Admission - Considered:  Head injury  Vitals signs within normal range and stable throughout visit. Imaging studies significant for: see above 73 year old male presents emergency department with complaints of head injury.  Patient was cutting down branches of some of the branches fell bouncing off the ground and struck him in the head.  Denies LOC, blood thinner use.  Denies falling subsequently or injuring anything else.  States that Andrew [name] has had swelling as well as headache on top of his head.  Did notice a abrasion on the top of his head of which was cleaned at home with iodine.  Denies any visual disturbance, gait abnormality, slurred speech, facial droop, weakness or sensory deficit of lower extremities.  Denies any pain elsewhere. On exam, superficial abrasion appreciated right of midline of forehead.  Nonfocal neurologic exam from baseline.  Given patient's age with head injury, CT imaging of patient's head and cervical spine obtained by triage staff which was negative for any acute traumatic injury.  Patient reassured by findings.  Patient skin abrasion cleansed with normal saline with thin layer of Dermabond applied at patient's request.  Will recommend symptomatic therapy as described in AVS with close follow-up with PCP in the outpatient setting.  Treatment plan discussed with patient and Andrew [name] acknowledged understanding was  agreeable to said plan.  Patient is well-appearing, afebrile in no acute distress. Worrisome signs and symptoms were discussed with the patient, and the patient acknowledged understanding to return to the ED if noticed. Patient was stable upon discharge.          Final Clinical Impression(s) / ED Diagnoses Final diagnoses:  None    Rx / DC Orders ED Discharge Orders     None         Fincastle Butter, [place] 05/21/24 1807    Mozell Arias, MD 05/22/24 [phone]

## 2024-05-23 DIAGNOSIS — H5703 Miosis: Secondary | ICD-10-CM | POA: Insufficient documentation

## 2024-06-11 ENCOUNTER — Ambulatory Visit: Admitting: Dermatology

## 2024-06-11 ENCOUNTER — Encounter: Payer: Self-pay | Admitting: Dermatology

## 2024-06-11 VITALS — BP 138/80 | HR 60 | Temp 98.6°F

## 2024-06-11 DIAGNOSIS — W908XXA Exposure to other nonionizing radiation, initial encounter: Secondary | ICD-10-CM | POA: Diagnosis not present

## 2024-06-11 DIAGNOSIS — I872 Venous insufficiency (chronic) (peripheral): Secondary | ICD-10-CM

## 2024-06-11 DIAGNOSIS — L578 Other skin changes due to chronic exposure to nonionizing radiation: Secondary | ICD-10-CM

## 2024-06-11 DIAGNOSIS — L820 Inflamed seborrheic keratosis: Secondary | ICD-10-CM

## 2024-06-11 DIAGNOSIS — L570 Actinic keratosis: Secondary | ICD-10-CM

## 2024-06-11 DIAGNOSIS — D1801 Hemangioma of skin and subcutaneous tissue: Secondary | ICD-10-CM

## 2024-06-11 DIAGNOSIS — L814 Other melanin hyperpigmentation: Secondary | ICD-10-CM | POA: Diagnosis not present

## 2024-06-11 DIAGNOSIS — Z1283 Encounter for screening for malignant neoplasm of skin: Secondary | ICD-10-CM | POA: Diagnosis not present

## 2024-06-11 DIAGNOSIS — L821 Other seborrheic keratosis: Secondary | ICD-10-CM

## 2024-06-11 DIAGNOSIS — C4441 Basal cell carcinoma of skin of scalp and neck: Secondary | ICD-10-CM

## 2024-06-11 DIAGNOSIS — D492 Neoplasm of unspecified behavior of bone, soft tissue, and skin: Secondary | ICD-10-CM

## 2024-06-11 DIAGNOSIS — Z7189 Other specified counseling: Secondary | ICD-10-CM

## 2024-06-11 DIAGNOSIS — D485 Neoplasm of uncertain behavior of skin: Secondary | ICD-10-CM

## 2024-06-11 MED ORDER — TRIAMCINOLONE ACETONIDE 0.1 % EX CREA: 1.0000 | TOPICAL_CREAM | Freq: Two times a day (BID) | CUTANEOUS | 2 refills | Status: AC

## 2024-06-11 MED ORDER — FLUOROURACIL 5 % EX CREA: TOPICAL_CREAM | Freq: Two times a day (BID) | CUTANEOUS | 0 refills | Status: AC

## 2024-06-11 NOTE — Progress Notes (Signed)
 New Patient Visit   Subjective  Andrew [name] is a 73 y.o. male accompanied by brother Dun[place]) who presents for the following: Total Body Skin Exam (TBSE)  Patient present today for new patient visit for TBSE.The patient reports he  has spots, moles and lesions to be evaluated, some may be new or changing and the patient may have concern these could be cancer. Patient has previously been treated by a dermatologist. Patient reports he  does not have hx of bx. Patient denies family history of skin cancers. Patient reports throughout his lifetime has had severe sun exposure. Currently, patient reports if he  has excessive sun exposure, he  does apply sunscreen and/or wears protective coverings.  The following portions of the chart were reviewed this encounter and updated as appropriate: medications, allergies, medical history  Review of Systems:  No other skin or systemic complaints except as noted in HPI or Assessment and Plan.  Objective  Well appearing patient in no apparent distress; mood and affect are within normal limits.  A full examination was performed including scalp, head, eyes, ears, nose, lips, neck, chest, axillae, abdomen, back, buttocks, bilateral upper extremities, bilateral lower extremities, hands, feet, fingers, toes, fingernails, and toenails. All findings within normal limits unless otherwise noted below.   Relevant exam findings are noted in the Assessment and Plan.  Neck - Anterior 9 mm Pink Pearly Papule  Right Breast Inflamed stuck on papule Mid Frontal Scalp, Mid Parietal Scalp (2), Right Frontal Scalp Erythematous thin papules/macules with gritty scale.   Assessment & Plan   LENTIGINES, SEBORRHEIC KERATOSES, HEMANGIOMAS - Benign normal skin lesions - Benign-appearing - Call for any changes  MELANOCYTIC NEVI - Tan-brown and/or pink-flesh-colored symmetric macules and papules - Benign appearing on exam today - Observation - Call clinic for new or  changing moles - Recommend daily use of broad spectrum spf 30+ sunscreen to sun-exposed areas.   ACTINIC DAMAGE - Chronic condition, secondary to cumulative UV/sun exposure - diffuse scaly erythematous macules with underlying dyspigmentation - Recommend daily broad spectrum sunscreen SPF 30+ to sun-exposed areas, reapply every 2 hours as needed.  - Staying in the shade or wearing long sleeves, sun glasses (UVA+UVB protection) and wide brim hats (4-inch brim around the entire circumference of the hat) are also recommended for sun protection.  - Call for new or changing lesions.  ACTINIC KERATOSIS Exam: Erythematous thin papules/macules with gritty scale at the Scalp  Actinic keratoses are precancerous spots that appear secondary to cumulative UV radiation exposure/sun exposure over time. They are chronic with expected duration over 1 year. A portion of actinic keratoses will progress to squamous cell carcinoma of the skin. It is not possible to reliably predict which spots will progress to skin cancer and so treatment is recommended to prevent development of skin cancer.  Recommend daily broad spectrum sunscreen SPF 30+ to sun-exposed areas, reapply every 2 hours as needed.  Recommend staying in the shade or wearing long sleeves, sun glasses (UVA+UVB protection) and wide brim hats (4-inch brim around the entire circumference of the hat). Call for new or changing lesions.  Treatment Plan: Start 5-fluorouracil cream twice a day for 14 days to affected areas including scalp.  Reviewed course of treatment and expected reaction.  Patient advised to expect inflammation and crusting and advised that erosions are possible.  Patient advised to be diligent with sun protection during and after treatment. Handout with details of how to apply medication and what to expect provided. Counseled to keep  medication out of reach of children and pets.  Reviewed course of treatment and expected reaction.  Patient  advised to expect inflammation and crusting and advised that erosions are possible.  Patient advised to be diligent with sun protection during and after treatment. Handout with details of how to apply medication and what to expect provided. Counseled to keep medication out of reach of children and pets.   STASIS DERMATITIS Exam: Erythematous, scaly patches involving the ankle and distal lower leg with associated lower leg edema.  Flared  Stasis in the legs causes chronic leg swelling, which may result in itchy or painful rashes, skin discoloration, skin texture changes, and sometimes ulceration.  Recommend daily graduated compression hose/stockings- easiest to put on first thing in morning, remove at bedtime.  Elevate legs as much as possible. Avoid salt/sodium rich foods.  Treatment Plan: TAC 0.1% cream BID   SKIN CANCER SCREENING PERFORMED TODAY NEOPLASM OF UNCERTAIN BEHAVIOR OF SKIN Neck - Anterior Epidermal / dermal shaving  Lesion diameter (cm):  0.9 Informed consent: discussed and consent obtained   Timeout: patient name, date of birth, surgical site, and procedure verified   Instrument used: DermaBlade   Hemostasis achieved with: aluminum chloride   Outcome: patient tolerated procedure well   Post-procedure details: sterile dressing applied and wound care instructions given   Specimen 1 - Surgical pathology Differential Diagnosis: R/O BCC  Check Margins: No STASIS DERMATITIS   Related Medications triamcinolone  cream (KENALOG ) 0.1 % Apply 1 Application topically 2 (two) times daily. Apply for 2 weeks then STOP and Take a break for 2 weeks. Continue usage every 2 weeks, 2 weeks on and 2 weeks off ACTINIC KERATOSIS   Related Medications fluorouracil (EFUDEX) 5 % cream Apply topically 2 (two) times daily. Apply 2 times daily for 14 days. BE SURE TO COVER THE HEAD DAILY, MEDICATION WILL CAUSE SENSITIVITY TO SUN INFLAMED SEBORRHEIC KERATOSIS Right Breast Destruction of  lesion - Right Breast Complexity: simple   Destruction method: cryotherapy   Timeout:  patient name, date of birth, surgical site, and procedure verified Cryotherapy cycles:  1 Outcome: patient tolerated procedure well with no complications   Post-procedure details: wound care instructions given   AK (ACTINIC KERATOSIS) (4) Mid Frontal Scalp, Mid Parietal Scalp (2), Right Frontal Scalp Destruction of lesion - Mid Frontal Scalp, Mid Parietal Scalp (2), Right Frontal Scalp Complexity: simple   Destruction method: cryotherapy   Timeout:  patient name, date of birth, surgical site, and procedure verified Cryotherapy cycles:  4 Outcome: patient tolerated procedure well with no complications   Post-procedure details: wound care instructions given   LENTIGINES   SEBORRHEIC KERATOSIS   CHERRY ANGIOMA    Return in about 8 weeks (around 08/06/2024) for AK F/U.  I, Jetta Ager, am acting as scribe for RUFUS CHRISTELLA HOLY, MD.  Documentation: I have reviewed the above documentation for accuracy and completeness, and I agree with the above.  RUFUS CHRISTELLA HOLY, MD

## 2024-06-11 NOTE — Patient Instructions (Addendum)

## 2024-06-12 ENCOUNTER — Ambulatory Visit: Payer: Self-pay | Admitting: Dermatology

## 2024-06-12 LAB — SURGICAL PATHOLOGY

## 2024-06-13 NOTE — Telephone Encounter (Signed)
 L/m for pt to call for result

## 2024-06-13 NOTE — Telephone Encounter (Signed)
-----   Message from [facility] sent at 06/12/2024  3:01 PM EDT ----- sBCC- neck- Mohs  Please call patient to discuss diagnosis and schedule for Mohs surgery. ----- Message ----- From: Interface, Lab In Three [facility] Sent: 06/12/2024   2:38 PM EDT To: Rufus CHRISTELLA Holy, MD

## 2024-06-15 NOTE — Telephone Encounter (Signed)
-----   Message from [facility] sent at 06/12/2024  3:01 PM EDT ----- sBCC- neck- Mohs  Please call patient to discuss diagnosis and schedule for Mohs surgery. ----- Message ----- From: Interface, Lab In Three [facility] Sent: 06/12/2024   2:38 PM EDT To: Rufus CHRISTELLA Holy, MD

## 2024-06-15 NOTE — Telephone Encounter (Signed)
 Patients sister call back for results.  I attempted to call back with no answer but I left a voicemail letting them know I was answering calls till 4 today

## 2024-06-18 ENCOUNTER — Other Ambulatory Visit: Payer: Self-pay

## 2024-06-18 MED ORDER — ATORVASTATIN CALCIUM 40 MG PO TABS: 40.0000 mg | ORAL_TABLET | Freq: Every day | ORAL | 1 refills | Status: DC

## 2024-06-18 NOTE — Telephone Encounter (Signed)
-----   Message from [facility] sent at 06/12/2024  3:01 PM EDT ----- sBCC- neck- Mohs  Please call patient to discuss diagnosis and schedule for Mohs surgery. ----- Message ----- From: Interface, Lab In Three [facility] Sent: 06/12/2024   2:38 PM EDT To: Rufus CHRISTELLA Holy, MD

## 2024-06-19 NOTE — Telephone Encounter (Signed)
-----   Message from [facility] sent at 06/12/2024  3:01 PM EDT ----- sBCC- neck- Mohs  Please call patient to discuss diagnosis and schedule for Mohs surgery. ----- Message ----- From: Interface, Lab In Three [facility] Sent: 06/12/2024   2:38 PM EDT To: Rufus CHRISTELLA Holy, MD

## 2024-06-19 NOTE — Telephone Encounter (Signed)
 Patient called back. Pathology results reviewed and treatment plan discussed. Patient has no questions. Message sent to the schedulers to contact the patient and make appointment.

## 2024-07-04 ENCOUNTER — Encounter: Payer: Self-pay | Admitting: Dermatology

## 2024-07-04 ENCOUNTER — Ambulatory Visit: Admitting: Dermatology

## 2024-07-04 VITALS — BP 128/77 | HR 63 | Temp 98.2°F

## 2024-07-04 DIAGNOSIS — W908XXD Exposure to other nonionizing radiation, subsequent encounter: Secondary | ICD-10-CM | POA: Diagnosis not present

## 2024-07-04 DIAGNOSIS — L820 Inflamed seborrheic keratosis: Secondary | ICD-10-CM | POA: Diagnosis not present

## 2024-07-04 DIAGNOSIS — L579 Skin changes due to chronic exposure to nonionizing radiation, unspecified: Secondary | ICD-10-CM

## 2024-07-04 DIAGNOSIS — L570 Actinic keratosis: Secondary | ICD-10-CM | POA: Diagnosis not present

## 2024-07-04 DIAGNOSIS — C4441 Basal cell carcinoma of skin of scalp and neck: Secondary | ICD-10-CM | POA: Diagnosis not present

## 2024-07-04 DIAGNOSIS — C4491 Basal cell carcinoma of skin, unspecified: Secondary | ICD-10-CM | POA: Insufficient documentation

## 2024-07-04 DIAGNOSIS — L814 Other melanin hyperpigmentation: Secondary | ICD-10-CM

## 2024-07-04 NOTE — Progress Notes (Signed)
 Follow-Up Visit   Subjective  Andrew [name] is a 73 y.o. 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 Corey. Patient is accompanied by his brother.   He is also 1 week s/p a 2 week topical efudex  course on his scalp with a substantial reaction.   The following portions of the chart were reviewed this encounter and updated as appropriate: medications, allergies, medical history  Review of Systems:  No other skin or systemic complaints except as noted in HPI or Assessment and Plan.  Objective  Well appearing patient in no apparent distress; mood and affect are within normal limits.  A focused examination was performed of the following areas: Neck-anterior Relevant physical exam findings are noted in the Assessment and Plan.   neck-anterior Pink pearly papule or plaque with arborizing vessels.   Neck - Anterior, Right Thigh - Anterior Inflamed stuck on papules  Assessment & Plan   BASAL CELL CARCINOMA (BCC), UNSPECIFIED SITE neck-anterior Mohs surgery  Consent obtained: written  Anticoagulation: Is the patient taking prescription anticoagulant and/or aspirin prescribed/recommended by a physician? No   Was the anticoagulation regimen changed prior to Mohs? No    Anesthesia: Anesthesia method: local infiltration Local anesthetic: lidocaine  1% WITH epi  Procedure Details: Timeout: pre-procedure verification complete Procedure Prep: patient was prepped and draped in usual sterile fashion Prep type: chlorhexidine Biopsy accession number: [id number] Biopsy lab: [facility] Frozen section biopsy performed: No   Specimen debulked: No   Pre-Op diagnosis: basal cell carcinoma BCC subtype: superficial MohsAIQ Surgical site (if tumor spans multiple areas, please select predominant area): neck Surgical site (from skin exam): neck-anterior Pre-operative length (cm): 1.4 Pre-operative width (cm):  1 Indications for Mohs surgery: anatomic location where tissue conservation is critical Previously treated? No    Micrographic Surgery Details: Post-operative length (cm): 3 Post-operative width (cm): 1.8 Number of Mohs stages: 1 Cumulative additional sections past 5 per stage: 0 Post surgery depth of defect: subcutaneous fat Is this a complex case (associate members only): No    Stage 1    Tumor features identified on Mohs section: no tumor identified    Depth of tumor invasion after stage: subcutaneous fat    Perineural invasion: no perineural invasion  Patient tolerance of procedure: tolerated well, no immediate complications  Reconstruction: Was the defect reconstructed? Yes   Was reconstruction performed by the same Mohs surgeon? Yes   Setting of reconstruction: outpatient office When was reconstruction performed? same day Type of reconstruction: linear Linear reconstruction: complex  Opioids: Did the patient receive a prescription for opioid/narcotic related to Mohs surgery?: No    Antibiotics: Does patient meet AHA guidelines for endocarditis?: No   Does patient meet AHA guidelines for orthopedic prophylaxis?: No   Were antibiotics given on the day of surgery?: No   Did surgery breach mucosa, expose cartilage/bone, involve an area of lymphedema/inflamed/infected tissue? No    Skin repair Complexity:  Complex Final length (cm):  5.2 Informed consent: discussed and consent obtained   Timeout: patient name, date of birth, surgical site, and procedure verified   Procedure prep:  Patient was prepped and draped in usual sterile fashion Prep type:  Chlorhexidine Anesthesia: the lesion was anesthetized in a standard fashion   Anesthetic:  1% lidocaine  w/ epinephrine 1-100,000 buffered w/ 8.4% NaHCO3 Reason for type of repair: reduce tension to allow closure, preserve normal anatomy, preserve normal anatomical and functional relationships and avoid adjacent structures  Undermining: area extensively undermined   Subcutaneous layers (deep stitches):  Suture size:  3-0 Suture type: PDS (polydioxanone)   Stitches:  Buried vertical mattress Fine/surface layer approximation (top stitches):  Suture size:  6-0 Suture type: cyanoacrylate tissue glue   Stitches: simple running   Hemostasis achieved with: suture, pressure and electrodesiccation Outcome: patient tolerated procedure well with no complications   Post-procedure details: sterile dressing applied and wound care instructions given   Dressing type: bandage and pressure dressing    INFLAMED SEBORRHEIC KERATOSIS (2) Neck - Anterior, Right Thigh - Anterior Destruction of lesion - Neck - Anterior, Right Thigh - Anterior Complexity: simple   Destruction method: cryotherapy   Informed consent: discussed and consent obtained   Outcome: patient tolerated procedure well with no complications   Post-procedure details: wound care instructions given     INFLAMED SEBORRHEIC KERATOSIS Exam: Erythematous keratotic or waxy stuck-on papule or plaque.  Symptomatic, irritating, patient would like treated.  Benign-appearing.  Call clinic for new or changing lesions.   Prior to procedure, discussed risks of blister formation, small wound, skin dyspigmentation, or rare scar following treatment. Recommend Vaseline ointment to treated areas while healing.  Destruction Procedure Note Destruction method: cryotherapy   Informed consent: discussed and consent obtained   Lesion destroyed using liquid nitrogen: Yes   Outcome: patient tolerated procedure well with no complications   Post-procedure details: wound care instructions given   Locations: chest and right hip # of Lesions Treated: 2   ACTINIC KERATOSIS Exam: Erythematous thin papules/macules with gritty scale at the scalp, very erythematous today, s/p 2 week course of topical 5FU with substantial reaction  Actinic keratoses are precancerous spots that appear  secondary to cumulative UV radiation exposure/sun exposure over time. They are chronic with expected duration over 1 year. A portion of actinic keratoses will progress to squamous cell carcinoma of the skin. It is not possible to reliably predict which spots will progress to skin cancer and so treatment is recommended to prevent development of skin cancer.  Recommend daily broad spectrum sunscreen SPF 30+ to sun-exposed areas, reapply every 2 hours as needed.  Recommend staying in the shade or wearing long sleeves, sun glasses (UVA+UVB protection) and wide brim hats (4-inch brim around the entire circumference of the hat). Call for new or changing lesions.  Treatment Plan: Will give more time to pass to allow erythema to heal before assessing treatment efficacy.    Return in about 4 weeks (around 08/01/2024) for Mohs f/u .  LILLETTE Berwyn Lesches, Surg Tech III, am acting as scribe for RUFUS CHRISTELLA HOLY, MD.    07/04/2024  HISTORY OF PRESENT ILLNESS  Andrew [name] is seen in consultation at the request of Dr. 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 They note that the area has been present for about 6 months increasing in size with time.  There is no history of previous treatment.  Reports no other new or changing lesions and has no other complaints today.  Medications and allergies: see patient chart.  Review of systems: Reviewed 8 systems and notable for the above skin cancer.  All other systems reviewed are unremarkable/negative, unless noted in the HPI. Past medical history, surgical history, family history, social history were also reviewed and are noted in the chart/questionnaire.    PHYSICAL EXAMINATION  General: Well-appearing, in no acute distress, alert and oriented x 4. Vitals reviewed in chart (if available).   Skin: Exam reveals a 1.4 x 1.0 cm  erythematous papule and biopsy scar on the left neck. There are rhytids, telangiectasias, and lentigines,  consistent with photodamage.  Biopsy report(s) reviewed, confirming the diagnosis.   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 Due to location, size, histology, or recurrence and the likelihood of subclinical extension as well as the need to conserve normal surrounding tissue, the patient was deemed acceptable for Mohs micrographic surgery (MMS).  The nature and purpose of the procedure, associated benefits and risks including recurrence and scarring, possible complications such as pain, infection, and bleeding, and alternative methods of treatment if appropriate were discussed with the patient during consent. The lesion location was verified by the patient, by reviewing previous notes, pathology reports, and by photographs as well as angulation measurements if available.  Informed consent was reviewed and signed by the patient, and timeout was performed at 8:45 AM. See op note below.  2. For the photodamage and solar lentigines, sun protection discussed/information given on OTC sunscreens, and we recommend continued regular follow-up with primary dermatologist every 6 months or sooner for any growing, bleeding, or changing lesions. 3. Prognosis and future surveillance discussed. 4. Letter with treatment outcome sent to referring provider. 5. Pain acetaminophen /ibuprofen   MOHS MICROGRAPHIC SURGERY AND RECONSTRUCTION  Initial size:   1.4 x 1.0 cm Surgical defect/wound size: 3.0 x 1.8 cm Anesthesia:    0.33% lidocaine  with 1:200,000 epinephrine EBL:    <5 mL Complications:  None Repair type:   Complex SQ suture:   3-0 PDS Cutaneous suture:  Cyanoacrylate and Steristrips Final size of the repair: 5.2 cm  Stages: 1  STAGE I: Anesthesia achieved with 0.5% lidocaine  with 1:200,000 epinephrine. ChloraPrep applied. 1 section(s) excised using Mohs technique (this includes total peripheral and deep tissue margin excision and  evaluation with frozen sections, excised and interpreted by the same physician). The tumor was first debulked and then excised with an approx. 2mm margin.  Hemostasis was achieved with electrocautery as needed.  The specimen was then oriented, subdivided/relaxed, inked, and processed using Mohs technique.    Frozen section analysis revealed a clear deep and peripheral margin.  Reconstruction  The surgical wound was then cleaned, prepped, and re-anesthetized as above. Wound edges were undermined extensively along at least one entire edge and at a distance equal to or greater than the width of the defect (see wound defect size above) in order to achieve closure and decrease wound tension and anatomic distortion. Redundant tissue repair including standing cone removal was performed. Hemostasis was achieved with electrocautery. Subcutaneous and epidermal tissues were approximated with the above sutures. The surgical site was then lightly scrubbed with sterile, saline-soaked gauze. Steri-strips were applied, and the area was then bandaged using Vaseline ointment, non-adherent gauze, gauze pads, and tape to provide an adequate pressure dressing. The patient tolerated the procedure well, was given detailed written and verbal wound care instructions, and was discharged in good condition.   The patient will follow-up: 4 weeks.   Documentation: I have reviewed the above documentation for accuracy and completeness, and I agree with the above.  RUFUS CHRISTELLA HOLY, MD

## 2024-07-04 NOTE — Patient Instructions (Signed)

## 2024-07-05 ENCOUNTER — Encounter: Payer: Self-pay | Admitting: Dermatology

## 2024-07-10 MED ORDER — TRAMADOL HCL 50 MG PO TABS: 50.0000 mg | ORAL_TABLET | Freq: Four times a day (QID) | ORAL | 0 refills | Status: AC | PRN

## 2024-07-10 NOTE — Addendum Note (Signed)
 Addended by: COREY RUFUS HERO on: 07/10/2024 02:17 PM   Modules accepted: Orders

## 2024-07-16 ENCOUNTER — Telehealth: Payer: Self-pay

## 2024-07-16 NOTE — Telephone Encounter (Signed)
1st attempt to reach pt regarding surgical clearance and the need for a tele visit.  Left a message for pt to call back and ask for the preop team. 

## 2024-07-16 NOTE — Telephone Encounter (Signed)
   Pre-operative Risk Assessment    Patient Name: Andrew [name]  DOB: March 23, 1951 MRN: [id number]   Date of last office visit: 12/29/23 Date of next office visit: Not scheduled   Request for Surgical Clearance    Procedure:  Dacryocystorhinostomy   Date of Surgery:  Clearance 08/03/24                                Surgeon:  Dr. Eluterio Surgeon's Group or Practice Name:  [facility] Phone number:  [phone] Fax number:  [phone]   Type of Clearance Requested:   - Medical  - Pharmacy:  Hold Aspirin x 7 days prior   Type of Anesthesia:  General    Additional requests/questions:    [name]   07/16/2024, 3:07 PM

## 2024-07-16 NOTE — Telephone Encounter (Signed)
   Name: Andrew [name]  DOB: September 09, 1951  MRN: [id number]  Primary Cardiologist: Ozell Fell, MD   Preoperative team, please contact this patient and set up a phone call appointment for further preoperative risk assessment. Please obtain consent and complete medication review. Thank you for your help.  I confirm that guidance regarding antiplatelet and oral anticoagulation therapy has been completed and, if necessary, noted below.  Patient currently not taking antiplatelet therapy.  I also confirmed the patient resides in the state of [place] . As per [facility] telemedicine laws, the patient must reside in the state in which the provider is licensed.   Josefa CHRISTELLA Beauvais, NP 07/16/2024, 3:38 PM [facility]

## 2024-07-17 ENCOUNTER — Telehealth: Payer: Self-pay

## 2024-07-17 NOTE — Telephone Encounter (Signed)
 Second attempt to schedule patient for a tele visit.

## 2024-07-17 NOTE — Telephone Encounter (Addendum)
 Spoke to patient's brother and he was confused as to why his brother (the patient) needed to have clearance when he no longer takes Xarelto  but just takes a baby aspirin. I explained to him that when he has clearance on 07/27/24 with Reche Finder, NP she can answer any questions they have about the procedure from a cardiac stand point. His brother informed Bertie cleared him and he should be fine. I informed him that was on the medical aspect, we had to give clearance since his brother was taking cardiac medicine. He said he will ask Caitlin why he needs to have this clearance when he just takes an aspirin. Brother confirmed the patient's meds were up to date.   Upon further analyzing his list after we got off the phone, Aspirin is not on his list.

## 2024-07-17 NOTE — Telephone Encounter (Signed)
Patient's brother is returning call. 

## 2024-07-17 NOTE — Telephone Encounter (Signed)
  Patient Consent for Virtual Visit        Andrew [name] has provided verbal consent on 07/17/2024 for a virtual visit (video or telephone).   CONSENT FOR VIRTUAL VISIT FOR:  Andrew [name]  By participating in this virtual visit I agree to the following:  I hereby voluntarily request, consent and authorize [facility] and its employed or contracted physicians, physician assistants, nurse practitioners or other licensed health care professionals (the Practitioner), to provide me with telemedicine health care services (the "Services) as deemed necessary by the treating Practitioner. I acknowledge and consent to receive the Services by the Practitioner via telemedicine. I understand that the telemedicine visit will involve communicating with the Practitioner through live audiovisual communication technology and the disclosure of certain medical information by electronic transmission. I acknowledge that I have been given the opportunity to request an in-person assessment or other available alternative prior to the telemedicine visit and am voluntarily participating in the telemedicine visit.  I understand that I have the right to withhold or withdraw my consent to the use of telemedicine in the course of my care at any time, without affecting my right to future care or treatment, and that the Practitioner or I may terminate the telemedicine visit at any time. I understand that I have the right to inspect all information obtained and/or recorded in the course of the telemedicine visit and may receive copies of available information for a reasonable fee.  I understand that some of the potential risks of receiving the Services via telemedicine include:  Delay or interruption in medical evaluation due to technological equipment failure or disruption; Information transmitted may not be sufficient (e.g. poor resolution of images) to allow for appropriate medical decision making by the Practitioner;  and/or  In rare instances, security protocols could fail, causing a breach of personal health information.  Furthermore, I acknowledge that it is my responsibility to provide information about my medical history, conditions and care that is complete and accurate to the best of my ability. I acknowledge that Practitioner's advice, recommendations, and/or decision may be based on factors not within their control, such as incomplete or inaccurate data provided by me or distortions of diagnostic images or specimens that may result from electronic transmissions. I understand that the practice of medicine is not an exact science and that Practitioner makes no warranties or guarantees regarding treatment outcomes. I acknowledge that a copy of this consent can be made available to me via my patient portal [facility] MyChart), or I can request a printed copy by calling the office of [facility].    I understand that my insurance will be billed for this visit.   I have read or had this consent read to me. I understand the contents of this consent, which adequately explains the benefits and risks of the Services being provided via telemedicine.  I have been provided ample opportunity to ask questions regarding this consent and the Services and have had my questions answered to my satisfaction. I give my informed consent for the services to be provided through the use of telemedicine in my medical care

## 2024-07-18 ENCOUNTER — Encounter: Admitting: Dermatology

## 2024-07-23 ENCOUNTER — Ambulatory Visit: Admitting: Dermatology

## 2024-07-26 NOTE — Progress Notes (Signed)
 Virtual Visit via Telephone Note   Because of Andrew [name]'s co-morbid illnesses, he is at least at moderate risk for complications without adequate follow up.  This format is felt to be most appropriate for this patient at this time.  The patient did not have access to video technology/had technical difficulties with video requiring transitioning to audio format only (telephone).  All issues noted in this document were discussed and addressed.  No physical exam could be performed with this format.  Please refer to the patient's chart for his consent to telehealth for Andrew [name].   Date:  07/27/2024   ID:  Andrew [name], MRN [id number] The patient was identified using 2 identifiers.  Patient Location: Home Provider Location: Office/Clinic   PCP:  Andrew Lenis, [name]   [facility] Providers Cardiologist:  Andrew [name]     Evaluation Performed:  Follow-Up Visit  Chief Complaint:  Preop clearance  History of Present Illness:    Andrew [name] is a 73 y.o. male with hx of HLD, chronic DVT, nonobstructive CAD. Family history of coronary artery disease.  Last seen 12/29/23 with left knee pain following a knee replacement a few years ago. LE duplex reviewed with chronic changes from last duplex 2019, xarelto  was stopped. He also noted some exertional dyspnea. Cardiac CTA 01/25/24 calcium  score of 0 with total plaque volume 23 mm3 with nonobstrictive noncalcified plaque in proximal LAD with 24-49% stenosis.   Presents today for preop clearance for dacryocystorhinostomy. Doing well since last seen. Reports no shortness of breath nor dyspnea on exertion. Reports no chest pain, pressure, or tightness. No edema, orthopnea, PND. Reports no palpitations.  Continues to stay active around his home. No leg swelling since discontinuation of Xarelto .    Past Medical History:  Diagnosis Date   Basal cell carcinoma    Chronic headaches    Colon polyps    DVT  (deep venous thrombosis) (HCC)    right leg in 2011; prior injury to right foot   Echocardiogram 07/2021   Echocardiogram 8/22: EF 60-65, no RWMA, mild LVH, low normal RVSF, trivial AI, mild AV sclerosis w/o AS   HLD (hyperlipidemia)    Right foot injury    Past Surgical History:  Procedure Laterality Date   HERNIA REPAIR     NO PAST SURGERIES       Current Meds  Medication Sig   albuterol  (VENTOLIN  HFA) 108 (90 Base) MCG/ACT inhaler TAKE 2 PUFFS BY MOUTH EVERY 6 HOURS AS NEEDED FOR WHEEZE OR SHORTNESS OF BREATH   allopurinol  (ZYLOPRIM ) 100 MG tablet TAKE 2 TABLETS BY MOUTH EVERY DAY   atorvastatin  (LIPITOR) 40 MG tablet Take 1 tablet (40 mg total) by mouth daily.   BREO ELLIPTA  100-25 MCG/ACT AEPB INHALE 1 PUFF INTO THE LUNGS DAILY   cyclobenzaprine  (FLEXERIL ) 10 MG tablet Take 1 tablet (10 mg total) by mouth [name] (three) times daily as needed for muscle spasms.   diclofenac  Sodium (VOLTAREN ) 1 % GEL Place onto the skin.   ezetimibe  (ZETIA ) 10 MG tablet Take 1 tablet (10 mg total) by mouth daily.   fluorouracil  (EFUDEX ) 5 % cream Apply topically 2 (two) times daily. Apply 2 times daily for 14 days. BE SURE TO COVER THE HEAD DAILY, MEDICATION WILL CAUSE SENSITIVITY TO SUN   gabapentin  (NEURONTIN ) 300 MG capsule Take 300 mg by mouth as needed.   meloxicam  (MOBIC ) 15 MG tablet Take 1 tablet by mouth daily.   montelukast  (SINGULAIR ) 10 MG  tablet TAKE 1 TABLET BY MOUTH EVERYDAY AT BEDTIME   tiZANidine  (ZANAFLEX ) 4 MG tablet TAKE 1 TABLET BY MOUTH EVERY DAY AS ONE DOSE   traMADol  (ULTRAM ) 50 MG tablet Take 1 tablet (50 mg total) by mouth every 8 (eight) hours as needed.   traMADol  (ULTRAM ) 50 MG tablet Take 1 tablet (50 mg total) by mouth every 6 (six) hours as needed for up to 8 doses.   triamcinolone  cream (KENALOG ) 0.1 % Apply 1 Application topically 2 (two) times daily. Apply for 2 weeks then STOP and Take a break for 2 weeks. Continue usage every 2 weeks, 2 weeks on and 2 weeks off      Allergies:   Penicillins and Influenza vaccines   Social History   Tobacco Use   Smoking status: Never   Smokeless tobacco: Never   Tobacco comments:    REMOTE SOCIAL HISTORY  Vaping Use   Vaping status: Never Used  Substance Use Topics   Alcohol use: No   Drug use: No     Family Hx: The patient's family history includes Colon polyps in his brother; Heart disease in his father and mother; Ovarian cancer in his mother; Prostate cancer in his brother; Uterine cancer in his mother; Vascular Disease in his maternal grandfather. There is no history of Neuropathy, Stomach cancer, Esophageal cancer, Colon cancer, or Rectal cancer.  ROS:   Please see the history of present illness.     All other systems reviewed and are negative.   Prior CV studies:   The following studies were reviewed today:  Cardiac Studies & Procedures   ______________________________________________________________________________________________   STRESS TESTS  ECHOCARDIOGRAM STRESS TEST 04/05/2018  Narrative *Andrew [name]* [address] [place], [place] [address] [phone]  ------------------------------------------------------------------- Stress Echocardiography  Patient:    Andrew [name] MR #:       [id number] Study Date: 04/05/2018 Gender:     M Age:        52 Height:     177.8 cm Weight:     95.8 kg BSA:        2.2 m^2 Pt. Status: Room:  SONOGRAPHER  Andrew [name] ATTENDING    Andrew [name] ORDERING     Andrew [name] REFERRING    Andrew [name] PERFORMING   [facility], Outpatient  cc:  -------------------------------------------------------------------  ------------------------------------------------------------------- Indications:      (R06.02).  ------------------------------------------------------------------- History:   PMH:   Dyspnea.  Risk factors:  Obese.  Dyslipidemia.  ------------------------------------------------------------------- Impressions:  - This is intrepreted as a negative stress echo. There is no evidence of ischemia. Normal LV function  ------------------------------------------------------------------- Study data:   Study status:  Routine.  Consent:  The risks, benefits, and alternatives to the procedure were explained to the patient and informed consent was obtained.  Procedure:  The patient reported no pain pre or post test. Initial setup. The patient was brought to the laboratory. A baseline ECG was recorded. Surface ECG leads and automatic cuff blood pressure measurements were monitored. Treadmill exercise testing was performed using the Bruce protocol. The patient exercised for 7 min 15 sec, to protocol stage [name], to a maximal work rate of 8.5 mets. Exercise was terminated due to fatigue. Transthoracic stress echocardiography for risk stratification. Images were captured at baseline and peak exercise. Study completion:  The patient tolerated the procedure well. There were no complications.          Bruce protocol. Stress echocardiography.  Birthdate:  Patient birthdate: [birth year].  Age:  Patient is 73 yr old.  Sex:  Gender: male.    BMI: 30.[name] kg/m^2. Blood pressure:     130/85  Patient status:  Outpatient.  Study date:  Study date: 04/05/2018. Study time: 03:19 PM.  -------------------------------------------------------------------  ------------------------------------------------------------------- Stress protocol:  +---------------------+---+------------+--------+ !Stage                !HR !BP (mmHg)   !Symptoms! +---------------------+---+------------+--------+ !Baseline             !68 !130/85 (100)!None    ! +---------------------+---+------------+--------+ !Stage 1              !111!110/80 (90) !--------! +---------------------+---+------------+--------+ !Stage 2              !129!183/80  (114)!--------! +---------------------+---+------------+--------+ !Stage [name]              !137!------------!Fatigue ! +---------------------+---+------------+--------+ !Immediate post stress!134!------------!--------! +---------------------+---+------------+--------+ !Recovery; 1 min      !78 !156/86 (109)!--------! +---------------------+---+------------+--------+ !Recovery; 2 min      !76 !------------!--------! +---------------------+---+------------+--------+ !Recovery; [name] min      !76 !165/83 (110)!--------! +---------------------+---+------------+--------+ !Recovery; 5 min      !75 !------------!--------! +---------------------+---+------------+--------+ !Recovery; 10 min     !---!130/84 (99) !--------! +---------------------+---+------------+--------+  ------------------------------------------------------------------- Stress results:   Maximal heart rate during stress was 137 bpm (90% of maximal predicted heart rate). The maximal predicted heart rate was 153 bpm.The target heart rate was achieved. The heart rate response to stress was normal. There was a normal resting blood pressure with an appropriate response to stress. The rate-pressure product for the peak heart rate and blood pressure was 76392 mm Hg/min.  The patient experienced no chest pain during stress.  ------------------------------------------------------------------- Stress ECG:  There were no ST or T wave changes to suggest ischemia .  ------------------------------------------------------------------- Baseline:  - LV global systolic function was normal. - Normal wall motion; no LV regional wall motion abnormalities.  Peak stress:  - LV global systolic function was vigorous. - Normal wall motion; no LV regional wall motion abnormalities. - No evidence for new LV regional wall motion abnormalities.  ------------------------------------------------------------------- Measurements  Left ventricle                          Value        Reference LV ID, ED, PLAX chordal                45.1  mm     43 - 52 LV ID, ES, PLAX chordal                29.1  mm     23 - 38 LV fx shortening, PLAX chordal         35    %      >=29 LV PW thickness, ED                    10.8  mm     --------- IVS/LV PW ratio, ED                    1.28         <=1.[name]  Ventricular septum                     Value        Reference IVS thickness, ED                      13.8  mm     ---------  Aorta                                  Value        Reference Aortic root ID, ED                     36    mm     --------- Ascending aorta ID, A-P, S             30    mm     ---------  Left atrium                            Value        Reference LA ID, A-P, ES                         40    mm     --------- LA ID/bsa, A-P                         1.82  cm/m^2 <=2.2  Legend: (L)  and  (H)  mark values outside specified reference range.  ------------------------------------------------------------------- Prepared and Electronically Authenticated by  Aleene Passe, M.D. 2019-04-24T17:28:02   ECHOCARDIOGRAM  ECHOCARDIOGRAM COMPLETE 07/31/2021  Narrative ECHOCARDIOGRAM REPORT    Patient Name:   SEABORN NAKAMA Date of Exam: 07/31/2021 Medical Rec #:  [id number]      Height:       70.0 in Accession #:    [id number]     Weight:       195.4 lb Date of Birth:  Nov 16, 1951      BSA:          2.067 m Patient Age:    73 years       BP:           94/64 mmHg Patient Gender: M              HR:           53 bpm. Exam Location:  [address]  Procedure: 2D Echo  Indications:    Dyspnea R06.00  History:        Patient has no prior history of Echocardiogram examinations. Risk Factors:Dyslipidemia.  Sonographer:    Augustin Seals RDCS Referring Phys: [id number] GLENDIA DASEN WEAVER  IMPRESSIONS   1. Left ventricular ejection fraction, by estimation, is 60 to 65%. The left ventricle has normal function. The left ventricle has no regional  wall motion abnormalities. There is mild left ventricular hypertrophy. Left ventricular diastolic parameters were normal. 2. Right ventricular systolic function is low normal. The right ventricular size is normal. [name]. The mitral valve is normal in structure. No evidence of mitral valve regurgitation. 4. The aortic valve is tricuspid. Aortic valve regurgitation is trivial. Mild aortic valve sclerosis is present, with no evidence of aortic valve stenosis.  Comparison(s): No prior Echocardiogram.  FINDINGS Left Ventricle: Left ventricular ejection fraction, by estimation, is 60 to 65%. The left ventricle has normal function. The left ventricle has no regional wall motion abnormalities. The left ventricular internal cavity size was normal in size. There is mild left ventricular hypertrophy. Left ventricular diastolic parameters were normal.  Right Ventricle: The right ventricular size is normal. No increase in right ventricular wall thickness.  Right ventricular systolic function is low normal.  Left Atrium: Left atrial size was normal in size.  Right Atrium: Right atrial size was normal in size.  Pericardium: There is no evidence of pericardial effusion.  Mitral Valve: The mitral valve is normal in structure. No evidence of mitral valve regurgitation.  Tricuspid Valve: The tricuspid valve is grossly normal. Tricuspid valve regurgitation is trivial.  Aortic Valve: The aortic valve is tricuspid. Aortic valve regurgitation is trivial. Mild aortic valve sclerosis is present, with no evidence of aortic valve stenosis.  Pulmonic Valve: The pulmonic valve was normal in structure. Pulmonic valve regurgitation is not visualized.  Aorta: The aortic root and ascending aorta are structurally normal, with no evidence of dilitation.  IAS/Shunts: No atrial level shunt detected by color flow Doppler.   LEFT VENTRICLE PLAX 2D LVIDd:         4.90 cm     Diastology LVIDs:         [name].10 cm     LV e'  medial:    5.92 cm/s LV PW:         1.10 cm     LV E/e' medial:  12.0 LV IVS:        1.10 cm     LV e' lateral:   10.10 cm/s LVOT diam:     2.20 cm     LV E/e' lateral: 7.0 LV SV:         94 LV SV Index:   45 LVOT Area:     [name].80 cm  LV Volumes (MOD) LV vol d, MOD A2C: 80.1 ml LV vol d, MOD A4C: 74.2 ml LV vol s, MOD A2C: 34.0 ml LV vol s, MOD A4C: 24.8 ml LV SV MOD A2C:     46.1 ml LV SV MOD A4C:     74.2 ml LV SV MOD BP:      50.[name] ml  RIGHT VENTRICLE RV S prime:     10.60 cm/s TAPSE (M-mode): 1.7 cm  LEFT ATRIUM             Index       RIGHT ATRIUM           Index LA diam:        [name].50 cm 1.69 cm/m  RA Area:     18.10 cm LA Vol (A2C):   48.[name] ml 23.37 ml/m RA Volume:   52.60 ml  25.45 ml/m LA Vol (A4C):   59.1 ml 28.60 ml/m LA Biplane Vol: 56.4 ml 27.29 ml/m AORTIC VALVE LVOT Vmax:   100.00 cm/s LVOT Vmean:  64.500 cm/s LVOT VTI:    0.247 m  AORTA Ao Root diam: 2.80 cm Ao Asc diam:  [name].40 cm  MITRAL VALVE MV Area (PHT): [name].06 cm    SHUNTS MV Decel Time: 248 msec    Systemic VTI:  0.25 m MV E velocity: 71.10 cm/s  Systemic Diam: 2.20 cm MV A velocity: 59.60 cm/s MV E/A ratio:  1.19  Vinie Maxcy [name] Electronically signed by Vinie Maxcy [name] Signature Date/Time: 07/31/2021/12:37:59 PM    Final      CT SCANS  CT CORONARY MORPH W/CTA COR W/SCORE 01/25/2024  Addendum 02/09/2024 11:47 PM ADDENDUM REPORT: 02/09/2024 23:45  EXAM: OVER-READ INTERPRETATION  CT CHEST  The following report is an over-read performed by radiologist Dr. Fonda Mom [facility] on 02/09/2024. This over-read does not include interpretation of cardiac or coronary anatomy or pathology. The coronary CTA interpretation by the cardiologist  is attached.  COMPARISON:  None.  FINDINGS: Cardiovascular:  See findings discussed in the body of the report.  Mediastinum/Nodes: No suspicious adenopathy identified. Imaged mediastinal structures are  unremarkable.  Lungs/Pleura: Imaged lungs are clear. No pleural effusion or pneumothorax.  Upper Abdomen: No acute abnormality.  Musculoskeletal: Mild bilateral gynecomastia. No acute osseous findings.  IMPRESSION: No acute extracardiac abnormality.   Electronically Signed By: Fonda Field M.D. On: 02/09/2024 23:45  Narrative CLINICAL DATA:  89M with DOE  EXAM: Cardiac/Coronary CTA  TECHNIQUE: The patient was scanned on a [employer].  FINDINGS: A 100 kV prospective scan was triggered in the descending thoracic aorta at 111 HU's. Axial non-contrast [name] mm slices were carried out through the heart. The data set was analyzed on a dedicated work station and scored using the Agatson method. Gantry rotation speed was 250 msecs and collimation was .6 mm. No beta blockade and 0.8 mg of sl NTG was given. The 3D data set was reconstructed in 5% intervals of the 35-75% of the R-R cycle. Phases were analyzed on a dedicated work station using MPR, MIP and VRT modes. The patient received 100 cc of contrast.  Coronary Arteries:  Normal coronary origin.  Right dominance.  RCA is a large dominant artery that gives rise to PDA and PLA. There is no plaque.  Left main is a large artery that gives rise to LAD and LCX arteries.  LAD is a large vessel. Noncalcified plaque in proximal LAD causes mild (25-49%) stneosis  LCX is a non-dominant artery that gives rise to one large OM1 branch. There is no plaque.  Other findings:  Left Ventricle: Normal size  Left Atrium: Mild enlargement  Pulmonary Veins: Normal configuration  Right Ventricle: Normal size  Right Atrium: Mild enlargement  Cardiac valves: No calcifications  Thoracic aorta: Normal size  Pulmonary Arteries: Dilated main pulmonary artery measuring 32mm  Systemic Veins: Normal drainage  Pericardium: Normal thickness  IMPRESSION: 1.  Coronary calcium score of 0.  2. Total plaque volume 50mm3  which is 3rd percentile for age and sex-matched controls (calcified plaque 54mm3; noncalcified plaque 98mm3). TPV is mild  [name].  Normal coronary origin with right dominance.  4. Nonobstructive CAD, with noncalcified plaque in proximal LAD causing mild (24-49%) stenosis  5.  Dilated main pulmonary artery measuring 32mm  CAD-RADS 2. Mild non-obstructive CAD (25-49%). Consider non-atherosclerotic causes of chest pain. Consider preventive therapy and risk factor modification.  Electronically Signed: By: Lonni Nanas M.D. On: 01/25/2024 19:07     ______________________________________________________________________________________________       Labs/Other Tests and Data Reviewed:    EKG:  An ECG dated 03/30/23 was personally reviewed today and demonstrated:  SB 55 bpm with no acute ST/T wave changes.  Recent Labs: 01/05/2024: ALT 28; BUN 20; Creatinine, Ser 1.03; Hemoglobin 15.[name]; Platelets 266.0; Potassium 4.4; Sodium 142   Recent Lipid Panel Lab Results  Component Value Date/Time   CHOL 143 12/29/2023 11:41 AM   TRIG 90 12/29/2023 11:41 AM   HDL 51 12/29/2023 11:41 AM   CHOLHDL 2.8 12/29/2023 11:41 AM   CHOLHDL [name] 04/08/2014 10:06 AM   LDLCALC 75 12/29/2023 11:41 AM    Wt Readings from Last [name] Encounters:  01/05/24 193 lb (87.5 kg)  12/29/23 193 lb (87.5 kg)  12/09/23 198 lb [name].2 oz (89.9 kg)     Risk Assessment/Calculations:          Objective:    Vital Signs:  There were no vitals taken for  this visit.   VITAL SIGNS:  reviewed  ASSESSMENT & PLAN:    Preop - According to the Revised Cardiac Risk Index (RCRI), his Perioperative Risk of Major Cardiac Event is (%): 6.6. His Functional Capacity in METs is: 5.07 according to the Duke Activity Status Index (DASI). Per AHA/ACC guidelines, he is deemed acceptable risk for the planned procedure without additional cardiovascular testing. Will route to surgical team so they are aware.  Hold Aspirin 7 days prior.    Nonobstructive CAD /HLD, LDL goal <70 - 12/28/23 LDL 75, permissible per Dr. Wonda. Stable with no anginal symptoms. No indication for ischemic evaluation.  GDMT aspirin 81mg  daily, atorvastatin 40mg  daily, zetia 10mg  daily. Heart healthy diet and regular cardiovascular exercise encouraged.    Chronic DVT - xarelto previously discontinued. Tolerating aspirin 81mg  daily. No swelling.             Time:   Today, I have spent 6 minutes with the patient with telehealth technology discussing the above problems.     Medication Adjustments/Labs and Tests Ordered: Current medicines are reviewed at length with the patient today.  Concerns regarding medicines are outlined above.   Tests Ordered: No orders of the defined types were placed in this encounter.   Medication Changes: No orders of the defined types were placed in this encounter.   Follow Up:  In Person January 2026  Signed, Reche GORMAN Finder, NP  07/27/2024 9:30 AM    [facility]

## 2024-07-27 ENCOUNTER — Telehealth (HOSPITAL_BASED_OUTPATIENT_CLINIC_OR_DEPARTMENT_OTHER): Admitting: Family

## 2024-07-27 ENCOUNTER — Encounter (HOSPITAL_BASED_OUTPATIENT_CLINIC_OR_DEPARTMENT_OTHER): Payer: Self-pay

## 2024-07-27 DIAGNOSIS — I25118 Atherosclerotic heart disease of native coronary artery with other forms of angina pectoris: Secondary | ICD-10-CM | POA: Diagnosis not present

## 2024-07-27 DIAGNOSIS — E785 Hyperlipidemia, unspecified: Secondary | ICD-10-CM

## 2024-07-27 DIAGNOSIS — Z01810 Encounter for preprocedural cardiovascular examination: Secondary | ICD-10-CM | POA: Diagnosis not present

## 2024-07-27 NOTE — Patient Instructions (Signed)
 Medication Instructions:  Continue your current medications  Hold Aspirin 7 days prior to procedure  *If you need a refill on your cardiac medications before your next appointment, please call your pharmacy*  Follow-Up: At [facility], you and your health needs are our priority.  As part of our continuing mission to provide you with exceptional heart care, our providers are all part of one team.  This team includes your primary Cardiologist (physician) and Advanced Practice Providers or APPs (Physician Assistants and Nurse Practitioners) who all work together to provide you with the care you need, when you need it.  Your next appointment:   January 2026 with Dr. Wonda We will contact you closer to time to schedule this visit  We recommend signing up for the patient portal called MyChart.  Sign up information is provided on this After Visit Summary.  MyChart is used to connect with patients for Virtual Visits (Telemedicine).  Patients are able to view lab/test results, encounter notes, upcoming appointments, etc.  Non-urgent messages can be sent to your provider as well.   To learn more about what you can do with MyChart, go to [email or web address].

## 2024-07-27 NOTE — Addendum Note (Signed)
 Addended by: Abbygale Lapid S on: 07/27/2024 09:33 AM   Modules accepted: Level of Service

## 2024-07-30 ENCOUNTER — Encounter: Payer: Self-pay | Admitting: Dermatology

## 2024-07-30 ENCOUNTER — Ambulatory Visit: Admitting: Dermatology

## 2024-07-30 DIAGNOSIS — C4491 Basal cell carcinoma of skin, unspecified: Secondary | ICD-10-CM

## 2024-07-30 DIAGNOSIS — L570 Actinic keratosis: Secondary | ICD-10-CM

## 2024-07-30 DIAGNOSIS — W908XXD Exposure to other nonionizing radiation, subsequent encounter: Secondary | ICD-10-CM | POA: Diagnosis not present

## 2024-07-30 DIAGNOSIS — L905 Scar conditions and fibrosis of skin: Secondary | ICD-10-CM | POA: Diagnosis not present

## 2024-07-30 DIAGNOSIS — Z85828 Personal history of other malignant neoplasm of skin: Secondary | ICD-10-CM | POA: Diagnosis not present

## 2024-07-30 NOTE — Progress Notes (Signed)
   Follow Up Visit   Subjective  Andrew [name] is a 73 y.o. male who presents for the following: follow up from Mohs surgery   The patient presents for follow up from Mohs surgery for a BCC on the neck-anterior, treated on 07/04/24, repaired with linear closure. The patient has been bandaging the wound as directed. The endorse the following concerns: none  He also used efudex  on his scalp BID for 2 weeks. Had a strong reaction. He is now using SPF 50+ on his scalp daily.   The following portions of the chart were reviewed this encounter and updated as appropriate: medications, allergies, medical history  Review of Systems:  No other skin or systemic complaints except as noted in HPI or Assessment and Plan.  Objective  Well appearing patient in no apparent distress; mood and affect are within normal limits.  A focal examination was performed including scalp, head, face and neck-anterior All findings within normal limits unless otherwise noted below.  Healing wound with mild erythema  Relevant physical exam findings are noted in the Assessment and Plan.    Assessment & Plan   Scar s/p Mohs for [facility], treated on 07/04/24, repaired with linear closure - Reassured that wound is healing well - No evidence of infection - No swelling, induration, purulence, dehiscence, or tenderness out of proportion to the clinical exam, see photo above - Discussed that scars take up to 12 months to mature from the date of surgery - Recommend SPF 30+ to scar daily to prevent purple color from UV exposure during scar maturation process - Discussed that erythema and raised appearance of scar will fade over the next 4-6 months - OK to start scar massage at 4-6 weeks post-op - Can consider silicone based products for scar healing starting at 6 weeks post-op - Ok to continue ointment daily to wound under a bandage for another week  HISTORY OF BASAL CELL CARCINOMA OF THE SKIN - No evidence of recurrence today -  Recommend regular full body skin exams - Recommend daily broad spectrum sunscreen SPF 30+ to sun-exposed areas, reapply every 2 hours as needed.  - Call if any new or changing lesions are noted between office visits  ACTINIC KERATOSIS Exam: Erythematous thin papules/macules with gritty scale at the scalp s/p 2 weeks efudex , Much improved  Actinic keratoses are precancerous spots that appear secondary to cumulative UV radiation exposure/sun exposure over time. They are chronic with expected duration over 1 year. A portion of actinic keratoses will progress to squamous cell carcinoma of the skin. It is not possible to reliably predict which spots will progress to skin cancer and so treatment is recommended to prevent development of skin cancer.  Recommend daily broad spectrum sunscreen SPF 30+ to sun-exposed areas, reapply every 2 hours as needed.  Recommend staying in the shade or wearing long sleeves, sun glasses (UVA+UVB protection) and wide brim hats (4-inch brim around the entire circumference of the hat). Call for new or changing lesions.  Return in about 6 months (around 01/30/2025) for TBSE.  I, Darice Smock, CMA, am acting as scribe for RUFUS CHRISTELLA HOLY, MD.   Documentation: I have reviewed the above documentation for accuracy and completeness, and I agree with the above.  RUFUS CHRISTELLA HOLY, MD

## 2024-07-30 NOTE — Patient Instructions (Addendum)

## 2024-10-15 ENCOUNTER — Encounter: Payer: Self-pay | Admitting: Radiology

## 2024-10-22 ENCOUNTER — Telehealth: Payer: Self-pay | Admitting: *Deleted

## 2024-10-22 ENCOUNTER — Telehealth (HOSPITAL_BASED_OUTPATIENT_CLINIC_OR_DEPARTMENT_OTHER): Payer: Self-pay

## 2024-10-22 NOTE — Telephone Encounter (Signed)
 Spoke with patient and scheduled him for a preop telehealth visit for tomorrow 10/23/24 at 3:20 PM.

## 2024-10-22 NOTE — Telephone Encounter (Signed)
   Pre-operative Risk Assessment    Patient Name: Andrew [name]  DOB: 08/16/1951 MRN: [id number]   Date of last office visit: 12/29/2023 - Dr. Wonda  Date of next office visit: N/A   Request for Surgical Clearance    Procedure:  Revision of Total Knee Arthroplasty   Date of Surgery:  Clearance 10/30/24                                 Surgeon: Dr. Fausto Lias  Surgeon's Group or Practice Name:  [facility]  Phone number:  [phone] Fax number:  [phone] or eFax [phone]   Type of Clearance Requested:   - Medical  - Pharmacy:  Hold Aspirin - Does not specify   Type of Anesthesia:  MAC/regional    Additional requests/questions:  Surgery length: 300 minutes   SignedPatrcia Iverson CROME   10/22/2024, 12:05 PM

## 2024-10-22 NOTE — Telephone Encounter (Signed)
   Name: Andrew [name]  DOB: [birth year]  MRN: [id number]  Primary Cardiologist: Ozell Fell, MD   Preoperative team, please contact this patient and set up a phone call appointment for further preoperative risk assessment. Please obtain consent and complete medication review. Thank you for your help.  I confirm that guidance regarding antiplatelet and oral anticoagulation therapy has been completed and, if necessary, noted below.  Ideally aspirin should be continued without interruption, however if the bleeding risk is too great, aspirin may be held for 5-7 days prior to surgery. Please resume aspirin post operatively when it is felt to be safe from a bleeding standpoint.    I also confirmed the patient resides in the state of [place] . As per [facility] Medical Board telemedicine laws, the patient must reside in the state in which the provider is licensed.    Barnie Hila, NP 10/22/2024, 12:32 PM [facility]

## 2024-10-22 NOTE — Telephone Encounter (Signed)
  Patient Consent for Virtual Visit        Andrew [name] has provided verbal consent on 10/22/2024 for a virtual visit (video or telephone).   CONSENT FOR VIRTUAL VISIT FOR:  Andrew [name]  By participating in this virtual visit I agree to the following:  I hereby voluntarily request, consent and authorize [facility] and its employed or contracted physicians, physician assistants, nurse practitioners or other licensed health care professionals (the Practitioner), to provide me with telemedicine health care services (the "Services) as deemed necessary by the treating Practitioner. I acknowledge and consent to receive the Services by the Practitioner via telemedicine. I understand that the telemedicine visit will involve communicating with the Practitioner through live audiovisual communication technology and the disclosure of certain medical information by electronic transmission. I acknowledge that I have been given the opportunity to request an in-person assessment or other available alternative prior to the telemedicine visit and am voluntarily participating in the telemedicine visit.  I understand that I have the right to withhold or withdraw my consent to the use of telemedicine in the course of my care at any time, without affecting my right to future care or treatment, and that the Practitioner or I may terminate the telemedicine visit at any time. I understand that I have the right to inspect all information obtained and/or recorded in the course of the telemedicine visit and may receive copies of available information for a reasonable fee.  I understand that some of the potential risks of receiving the Services via telemedicine include:  Delay or interruption in medical evaluation due to technological equipment failure or disruption; Information transmitted may not be sufficient (e.g. poor resolution of images) to allow for appropriate medical decision making by the  Practitioner; and/or  In rare instances, security protocols could fail, causing a breach of personal health information.  Furthermore, I acknowledge that it is my responsibility to provide information about my medical history, conditions and care that is complete and accurate to the best of my ability. I acknowledge that Practitioner's advice, recommendations, and/or decision may be based on factors not within their control, such as incomplete or inaccurate data provided by me or distortions of diagnostic images or specimens that may result from electronic transmissions. I understand that the practice of medicine is not an exact science and that Practitioner makes no warranties or guarantees regarding treatment outcomes. I acknowledge that a copy of this consent can be made available to me via my patient portal [facility] MyChart), or I can request a printed copy by calling the office of [facility].    I understand that my insurance will be billed for this visit.   I have read or had this consent read to me. I understand the contents of this consent, which adequately explains the benefits and risks of the Services being provided via telemedicine.  I have been provided ample opportunity to ask questions regarding this consent and the Services and have had my questions answered to my satisfaction. I give my informed consent for the services to be provided through the use of telemedicine in my medical care

## 2024-10-22 NOTE — Telephone Encounter (Signed)
Will route to requesting surgeon's office to make them aware.

## 2024-10-23 ENCOUNTER — Ambulatory Visit: Attending: Cardiology | Admitting: Student

## 2024-10-23 DIAGNOSIS — Z01810 Encounter for preprocedural cardiovascular examination: Secondary | ICD-10-CM

## 2024-10-23 NOTE — Progress Notes (Signed)
 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  This format is felt to be most appropriate for this patient at this time.  The patient did not have access to video technology/had technical difficulties with video requiring transitioning to audio format only (telephone).  All issues noted in this document were discussed and addressed.  No physical exam could be performed with this format.  Please refer to the patient's chart for his consent to telehealth for [facility].  Evaluation Performed:  Preoperative cardiovascular risk assessment _____________   Date:  10/23/2024   Patient ID:  Andrew [name], DOB 14-Apr-1951, MRN [id number] Patient Location:  Home Provider location:   Office  Primary Care Provider:  Pura Lenis, MD  Primary Cardiologist:  [facility] Providers Cardiologist:  Ozell Fell, MD    Chief Complaint / Patient Profile   73 y.o. y/o male with a h/o nonobstructive CAD, hyperlipidemia, chronic DVT who is pending Revision of total knee arthroplasty by Dr. Cheryll and presents today for telephonic preoperative cardiovascular risk assessment.  History of Present Illness    [name] is a 73 y.o. male who presents via audio/video conferencing for a telehealth visit today.  Pt was last seen in cardiology clinic on 12/29/2023 by Dr. Fell.  At that time Andrew [name] was stable from a cardiac standpoint.  The patient is now pending procedure as outlined above. Since his last visit, he is doing well. Patient denies shortness of breath, dyspnea on exertion, lower extremity edema, orthopnea or PND. No chest pain, pressure, or tightness. No palpitations.  He is not experiencing lightheadedness, dizziness, presyncope or syncope. He is staying active as pain allows. When he is having a good day he will walk or go to the gym. Otherwise he is  independent with ADLs and is able to perform light to moderate household activities.    Past Medical History    Past Medical History:  Diagnosis Date   Basal cell carcinoma    Chronic headaches    Colon polyps    DVT (deep venous thrombosis) (HCC)    right leg in 2011; prior injury to right foot   Echocardiogram 07/2021   Echocardiogram 8/22: EF 60-65, no RWMA, mild LVH, low normal RVSF, trivial AI, mild AV sclerosis w/o AS   HLD (hyperlipidemia)    Right foot injury    Past Surgical History:  Procedure Laterality Date   HERNIA REPAIR     NO PAST SURGERIES      Allergies  Allergies  Allergen Reactions   Penicillins Anaphylaxis   Influenza Vaccines     Broke out in hives and had breathing problems    Home Medications    Prior to Admission medications   Medication Sig Start Date End Date Taking? Authorizing Provider  albuterol  (VENTOLIN  HFA) 108 (90 Base) MCG/ACT inhaler TAKE 2 PUFFS BY MOUTH EVERY 6 HOURS AS NEEDED FOR WHEEZE OR SHORTNESS OF BREATH 01/27/24   Kassie Acquanetta Bradley, MD  allopurinol  (ZYLOPRIM ) 100 MG tablet TAKE 2 TABLETS BY MOUTH EVERY DAY 07/02/22   Smith, Zachary M, DO  aspirin EC 81 MG tablet Take 81 mg by mouth daily. Swallow whole.    [provider]  atorvastatin  (LIPITOR) 40 MG tablet Take 1 tablet (40 mg total) by mouth daily. 06/18/24   Fell Ozell, MD  BREO ELLIPTA  100-25 MCG/ACT AEPB INHALE 1 PUFF INTO THE LUNGS  DAILY 06/28/22   Kassie Acquanetta Bradley, MD  cyclobenzaprine  (FLEXERIL ) 10 MG tablet Take 1 tablet (10 mg total) by mouth 3 (three) times daily as needed for muscle spasms. 12/31/21   Nitka, James E, MD  diclofenac  Sodium (VOLTAREN ) 1 % GEL Place onto the skin.    [provider]  ezetimibe  (ZETIA ) 10 MG tablet Take 1 tablet (10 mg total) by mouth daily. 03/30/23 07/27/24  Lambert, Jennifer K, PA-C  fluorouracil  (EFUDEX ) 5 % cream Apply topically 2 (two) times daily. Apply 2 times daily for 14 days. BE SURE TO COVER THE HEAD DAILY,  MEDICATION WILL CAUSE SENSITIVITY TO SUN 06/11/24   Paci, Karina M, MD  gabapentin  (NEURONTIN ) 300 MG capsule Take 300 mg by mouth as needed.    [provider]  meloxicam  (MOBIC ) 15 MG tablet Take 1 tablet by mouth daily.    [provider]  montelukast  (SINGULAIR ) 10 MG tablet TAKE 1 TABLET BY MOUTH EVERYDAY AT BEDTIME 05/09/23   Smith, Zachary M, DO  tiZANidine  (ZANAFLEX ) 4 MG tablet TAKE 1 TABLET BY MOUTH EVERY DAY AS ONE DOSE 09/09/23   Eldonna Novel, MD  traMADol  (ULTRAM ) 50 MG tablet Take 1 tablet (50 mg total) by mouth every 8 (eight) hours as needed. 12/31/21   Nitka, James E, MD  traMADol  (ULTRAM ) 50 MG tablet Take 1 tablet (50 mg total) by mouth every 6 (six) hours as needed for up to 8 doses. 07/10/24   Paci, Karina M, MD  triamcinolone  cream (KENALOG ) 0.1 % Apply 1 Application topically 2 (two) times daily. Apply for 2 weeks then STOP and Take a break for 2 weeks. Continue usage every 2 weeks, 2 weeks on and 2 weeks off 06/11/24   Paci, Karina M, MD    Physical Exam    Vital Signs:  Kramer Speights does not have vital signs available for review today.  Given telephonic nature of communication, physical exam is limited. AAOx3. NAD. Normal affect.  Speech and respirations are unlabored.   Assessment & Plan    Preoperative cardiovascular risk assessment. Revision of total knee arthroplasty by Dr. Cheryll on 10/30/2024.  Chart reviewed as part of pre-operative protocol coverage. According to the RCRI, patient has a 0.4% risk of MACE. Patient reports activity equivalent to >4.0 METS (walking and going to the gym pain permitting, independent with ADLs and light to moderate household activities).   Given past medical history and time since last visit, based on ACC/AHA guidelines, Andrew [name] would be at acceptable risk for the planned procedure without further cardiovascular testing.   Patient was advised that if he develops new symptoms prior to surgery to contact our  office to arrange a follow-up appointment.  he verbalized understanding.  Ideally aspirin should be continued without interruption, however if the bleeding risk is too great, aspirin may be held for 5-7 days prior to surgery. Please resume aspirin post operatively when it is felt to be safe from a bleeding standpoint.    I will route this recommendation to the requesting party via Epic fax function.  Please call with questions.  Time:   Today, I have spent 5 minutes with the patient with telehealth technology discussing medical history, symptoms, and management plan.     Barnie Hila, NP  10/23/2024, 3:22 PM

## 2024-11-30 ENCOUNTER — Encounter: Payer: Self-pay | Admitting: Cardiovascular Disease

## 2024-12-24 ENCOUNTER — Other Ambulatory Visit: Payer: Self-pay | Admitting: Cardiovascular Disease

## 2024-12-25 ENCOUNTER — Ambulatory Visit: Admitting: Family Medicine

## 2024-12-26 ENCOUNTER — Ambulatory Visit: Admitting: Family Medicine

## 2025-01-09 ENCOUNTER — Ambulatory Visit: Admitting: Family Medicine

## 2025-01-10 ENCOUNTER — Ambulatory Visit: Attending: Student | Admitting: Physical Therapy

## 2025-01-10 DIAGNOSIS — M62838 Other muscle spasm: Secondary | ICD-10-CM | POA: Diagnosis present

## 2025-01-10 DIAGNOSIS — R269 Unspecified abnormalities of gait and mobility: Secondary | ICD-10-CM | POA: Diagnosis present

## 2025-01-10 DIAGNOSIS — M6281 Muscle weakness (generalized): Secondary | ICD-10-CM | POA: Insufficient documentation

## 2025-01-10 DIAGNOSIS — R293 Abnormal posture: Secondary | ICD-10-CM | POA: Diagnosis present

## 2025-01-10 DIAGNOSIS — R279 Unspecified lack of coordination: Secondary | ICD-10-CM | POA: Diagnosis present

## 2025-01-10 DIAGNOSIS — M79605 Pain in left leg: Secondary | ICD-10-CM | POA: Diagnosis present

## 2025-01-10 NOTE — Therapy (Signed)
 " OUTPATIENT PHYSICAL THERAPY LOWER EXTREMITY EVALUATION   Patient Name: Andrew [name] MRN: [id number] DOB:1951-03-15, 74 y.o., male Today's Date: 01/11/2025  END OF SESSION:  PT End of Session - 01/10/25 1453     Visit Number 1    Date for Recertification  03/07/25    Authorization Type MEDICARE PART A AND B    PT Start Time 1446    PT Stop Time 1525    PT Time Calculation (min) 39 min    Activity Tolerance Patient tolerated treatment well    Behavior During Therapy [facility] for tasks assessed/performed          Past Medical History:  Diagnosis Date   Basal cell carcinoma    Chronic headaches    Colon polyps    DVT (deep venous thrombosis) (HCC)    right leg in 2011; prior injury to right foot   Echocardiogram 07/2021   Echocardiogram 8/22: EF 60-65, no RWMA, mild LVH, low normal RVSF, trivial AI, mild AV sclerosis w/o AS   HLD (hyperlipidemia)    Right foot injury    Past Surgical History:  Procedure Laterality Date   HERNIA REPAIR     NO PAST SURGERIES     Patient Active Problem List   Diagnosis Date Noted   BCC (basal cell carcinoma of skin) 07/04/2024   Small pupil 05/23/2024   Cortical age-related cataract of left eye 04/16/2024   Cortical age-related cataract of right eye 04/16/2024   Posterior subcapsular polar age-related cataract of left eye 04/16/2024   Posterior subcapsular polar age-related cataract of right eye 04/16/2024   Arthralgia of right knee 03/26/2024   Epiphora due to insufficient drainage of right side 03/02/2024   Posterior subcapsular age-related cataract of both eyes 03/02/2024   History of total left knee replacement 12/21/2023   Hamstring tendonitis 02/17/2023   Left patella fracture 05/27/2022   Acute bronchitis 02/18/2022   Loss of transverse plantar arch of right foot 12/17/2021   Arthralgia of left knee 12/02/2021   COVID-19 long hauler manifesting chronic cough 10/28/2021   History of COVID-19 10/28/2021   Effusion of left knee  09/09/2021   History of deep vein thrombosis (DVT) of lower extremity 05/13/2021   Degenerative cervical disc 01/16/2021   Right shoulder pain 01/16/2021   Degenerative arthritis of knee 05/28/2020   Spondylosis without myelopathy or radiculopathy, lumbar region 10/11/2016   Migraines 06/08/2016   Nuclear sclerotic cataract of both eyes 06/08/2016   Presbyopia 06/08/2016   Hypermetropia of both eyes 06/08/2016   Long term current use of anticoagulant 06/02/2016   Recurrent right inguinal hernia 12/05/2014   Chronic venous insufficiency 11/28/2014   Bradycardia 10/11/2012   Right leg DVT (HCC) 10/03/2012   HLD (hyperlipidemia) 07/25/2012    PCP: Cheryll Fausto Nasuti, MD  REFERRING PROVIDER: [phone] (ICD-10-CM) - Presence of left artificial knee joint   REFERRING DIAG: 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 Status post 12 weeks cast immobilization full leg  length, referral- work on quadriceps strength, hamstring and quadriceps isolation.  PERTINENT HISTORY: Lt TKA, patella fracture lt, DVT PAIN:  Are you having pain? No  PRECAUTIONS: Other: 0-30 flexion for 2 weeks, 0-60 for two weeks, then 0-tolerance after. Heel toe gait training.  RED FLAGS: None   WEIGHT BEARING RESTRICTIONS: not listed but per referral heel toe gait training and per pt and family as tolerated.   FALLS:  Has patient  fallen in last 6 months? No  LIVING ENVIRONMENT: Lives with: lives with their family Lives in: Other main level - usually in recliner Stairs: 5to enter home but level for him inside with 2 handrails Has following equipment at home: Environmental Consultant - 2 wheeled  OCCUPATION: retired   PLOF: Independent  PATIENT GOALS: to walk more without having to stop, have better balance, go up and down stairs when able, feel better about walking out of the house or   NEXT MD VISIT: 02/20/25 (6 week follow up)  OBJECTIVE:  Note: Objective measures were completed at Evaluation unless otherwise noted.   PATIENT SURVEYS:  LEFS  Extreme difficulty/unable (0), Quite a bit of difficulty (1), Moderate difficulty (2), Little difficulty (3), No difficulty (4) Survey date:  01/10/25  Any of your usual work, housework or school activities 0  2. Usual hobbies, recreational or sporting activities   3. Getting into/out of the bath 1  4. Walking between rooms 1  5. Putting on socks/shoes 1  6. Squatting  0  7. Lifting an object, like a bag of groceries from the floor 1  8. Performing light activities around your home 0  9. Performing heavy activities around your home 0  10. Getting into/out of a car 1  11. Walking 2 blocks 0  12. Walking 1 mile 0  13. Going up/down 10 stairs (1 flight) 0  14. Standing for 1 hour 0  15.  sitting for 1 hour 2  16. Running on even ground 0  17. Running on uneven ground 0  18. Making sharp turns while running fast 0  19. Hopping  0  20. Rolling over in bed 2  Score total:  9/80   Lower Extremity Functional Score: 9 / 80 = 11.3 %  COGNITION: Overall cognitive status: Within functional limits for tasks assessed     SENSATION: WFL  EDEMA:  Mild swelling noted in Lt lower leg/ankle with skin indention from bracing but no swelling noted at lt knee and pt without pain, no redness or warmth   POSTURE: rounded shoulders and forward head  PALPATION: No TTP  LOWER EXTREMITY ROM:  RLE WFL   ROM Right eval Left eval  Hip flexion  90  Hip extension    Hip abduction    Hip adduction    Hip internal rotation    Hip external rotation    Knee flexion  30  Knee extension  0  Ankle dorsiflexion    Ankle plantarflexion    Ankle inversion    Ankle eversion     (Blank rows = not tested)  LOWER EXTREMITY MMT: RLE 4/5 throughout MMT Right eval Left eval  Hip flexion  3  Hip extension  3  Hip abduction  3+  Hip adduction  3+  Hip internal rotation    Hip external rotation    Knee flexion  2  Knee extension  2  Ankle dorsiflexion  3+  Ankle plantarflexion  3+  Ankle inversion    Ankle eversion     (  Blank rows = not tested)   FUNCTIONAL TESTS:  5 times sit to stand: 19s Timed up and go (TUG): 22s  GAIT: Distance walked: 100' Assistive device utilized: None Level of assistance: SBA Comments: lateral trunk sway with limb advancement, decreased step length on Rt side, decreased cadence, trunk flexion, forward head, and decreased step height bil, poor heel toe pattern Lt                                                                                                                                TREATMENT DATE:  01/09/25 Examination completed, findings reviewed, pt educated on POC, HEP. Pt motivated to participate in PT and agreeable to attempt recommendations.      PATIENT EDUCATION:  Education details: [employer] Person educated: Patient Education method: Explanation, Demonstration, Tactile cues, Verbal cues, and Handouts Education comprehension: verbalized understanding, returned demonstration, verbal cues required, tactile cues required, and needs further education  HOME EXERCISE PROGRAM: [id number]  ASSESSMENT:  CLINICAL IMPRESSION: Patient is a 74 y.o. male  who was seen today for physical therapy evaluation and treatment status post extensor mechanism reconstruction with patella bulk allograft. Status post  12 weeks cast immobilization. Pt  presents with Lt hinge brace in place with mobility locked at 30 knee flexion as per MD precautions (listed above). Pt denies all pain and per pt and pt's brother - WBAT and per MD order work on heel toe gait training. Pt has FWW at home and has been using this until day of eval then came to clinic for eval without AD. Pt demonstrates impaired posture, balance, gait mechanics, increased fall risk with scoring on TUG and 5xSTS, decreased bil hip and knee strength however worse on Lt, pt does have quad activation but significantly weakened compared to Rt. Pt also has decreased ROM on lt LE. Pt reports mobility impairments have decreased his ability to walk, stand, navigate stairs and curbs in home and community, complete ADLs, community errands (groceries, appointments), and would benefit from skilled PT to address these deficits and impairments.   OBJECTIVE IMPAIRMENTS: Abnormal gait, decreased activity tolerance, decreased balance, decreased coordination, decreased endurance, decreased knowledge of use of DME, decreased mobility, difficulty walking, decreased ROM, decreased strength, increased fascial restrictions, impaired perceived functional ability, impaired flexibility, improper body mechanics, and postural dysfunction.   ACTIVITY LIMITATIONS: carrying, lifting, bending, sitting, standing, squatting, sleeping, stairs, transfers, bed mobility, bathing, dressing, hygiene/grooming, and locomotion level  PARTICIPATION LIMITATIONS: cleaning, laundry, shopping, and community activity  PERSONAL FACTORS: 1 comorbidity: medical history are also affecting patient's functional outcome.   REHAB POTENTIAL: Good  CLINICAL DECISION MAKING: Stable/uncomplicated  EVALUATION COMPLEXITY: Low   GOALS: Goals reviewed with patient? Yes  SHORT TERM GOALS: Target date: 02/07/25 Pt to be I with HEP for carry over and continuing recommendations for improved outcomes.   Baseline: Goal status: INITIAL  2.  Pt to  demonstrate improved heel toe gait pattern bil without compensation for at  least 100' to improve ability to ambulate safely in home.  Baseline:  Goal status: INITIAL  3.  Pt to demonstrate improved 5xsts to no more than 10s for decreased fall risk Baseline:  Goal status: INITIAL   LONG TERM GOALS: Target date: 03/07/25  Pt to be I with advanced HEP for carry over and continuing recommendations for improved outcomes.   Baseline:  Goal status: INITIAL  2.  Pt to complete 6 MWT within age related norm to improve tolerance to community ambulating.  Baseline:  Goal status: INITIAL  3.  Pt demonstrate x10 sit to stands without compensation for improved safety with transfers in home and car.  Baseline:  Goal status: INITIAL  4.  Pt to demonstrate improved posture without compensation to improve gait and standing mechanics to tolerate walking at least 30 mins.  Baseline:  Goal status: INITIAL  5.  Pt to demonstrate at least 120 degrees of knee flexion to improve I with stair navigation once cleared for this range of motion from MD.  Baseline:  Goal status: INITIAL  6.  Pt to demonstrate 5/5 knee strength bil for improved gait mechanics.  Baseline:  Goal status: INITIAL   PLAN:  PT FREQUENCY: 2x/week  PT DURATION: 8 weeks  PLANNED INTERVENTIONS: 97110-Therapeutic exercises, 97530- Therapeutic activity, 97112- Neuromuscular re-education, 97535- Self Care, [id number]- Manual therapy, [phone]- Gait training, [phone]- Canalith repositioning, [id number]- Aquatic Therapy, [phone]- Electrical stimulation (manual), [id number]- Vasopneumatic device, [id number]- Ultrasound, [id number]- Ionotophoresis 4mg /ml Dexamethasone, 20560 (1-2 muscles), 20561 (3+ muscles)- Dry Needling, Patient/Family education, Balance training, Stair training, Taping, Joint mobilization, Joint manipulation, Spinal manipulation, Spinal mobilization, Scar mobilization, DME instructions, Cryotherapy, Moist heat, and Biofeedback  PLAN FOR NEXT SESSION:  gait training, hip and knee stretching no more than 30 degrees knee flexion   Darryle Navy, PT, DPT 01/11/2609:48 AM  [facility] [address] [place], [place] [address] Phone # [phone] Fax [phone]  "

## 2025-01-11 ENCOUNTER — Encounter: Payer: Self-pay | Admitting: Cardiovascular Disease

## 2025-01-11 ENCOUNTER — Ambulatory Visit: Attending: Cardiovascular Disease | Admitting: Cardiovascular Disease

## 2025-01-11 VITALS — BP 100/70 | HR 56 | Ht 70.0 in | Wt 191.0 lb

## 2025-01-11 DIAGNOSIS — I82509 Chronic embolism and thrombosis of unspecified deep veins of unspecified lower extremity: Secondary | ICD-10-CM | POA: Insufficient documentation

## 2025-01-11 DIAGNOSIS — E782 Mixed hyperlipidemia: Secondary | ICD-10-CM | POA: Diagnosis present

## 2025-01-11 NOTE — Assessment & Plan Note (Signed)
 Tolerating atorvastatin .  LDL is 75.  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  Continue current management.

## 2025-01-11 NOTE — Patient Instructions (Signed)

## 2025-01-14 ENCOUNTER — Ambulatory Visit

## 2025-01-16 ENCOUNTER — Encounter: Payer: Self-pay | Admitting: Physical Therapy

## 2025-01-16 ENCOUNTER — Ambulatory Visit: Admitting: Physical Therapy

## 2025-01-16 DIAGNOSIS — M62838 Other muscle spasm: Secondary | ICD-10-CM

## 2025-01-16 DIAGNOSIS — M79605 Pain in left leg: Secondary | ICD-10-CM

## 2025-01-16 DIAGNOSIS — M6281 Muscle weakness (generalized): Secondary | ICD-10-CM

## 2025-01-16 DIAGNOSIS — R269 Unspecified abnormalities of gait and mobility: Secondary | ICD-10-CM

## 2025-01-16 DIAGNOSIS — R293 Abnormal posture: Secondary | ICD-10-CM

## 2025-01-16 DIAGNOSIS — R279 Unspecified lack of coordination: Secondary | ICD-10-CM

## 2025-01-16 NOTE — Therapy (Signed)
 " OUTPATIENT PHYSICAL THERAPY LOWER EXTREMITY TREATMENT   Patient Name: Andrew [name] MRN: [id number] DOB:1951-03-02, 74 y.o., male Today's Date: 01/16/2025  END OF SESSION:  PT End of Session - 01/16/25 1235     Visit Number 2    Date for Recertification  03/07/25    Authorization Type MEDICARE PART A AND B    PT Start Time 1236    PT Stop Time 1321    PT Time Calculation (min) 45 min    Activity Tolerance Patient tolerated treatment well    Behavior During Therapy [facility] for tasks assessed/performed           Past Medical History:  Diagnosis Date   Basal cell carcinoma    Chronic headaches    Colon polyps    DVT (deep venous thrombosis) (HCC)    right leg in 2011; prior injury to right foot   Echocardiogram 07/2021   Echocardiogram 8/22: EF 60-65, no RWMA, mild LVH, low normal RVSF, trivial AI, mild AV sclerosis w/o AS   HLD (hyperlipidemia)    Right foot injury    Past Surgical History:  Procedure Laterality Date   HERNIA REPAIR     NO PAST SURGERIES     Patient Active Problem List   Diagnosis Date Noted   BCC (basal cell carcinoma of skin) 07/04/2024   Small pupil 05/23/2024   Cortical age-related cataract of left eye 04/16/2024   Cortical age-related cataract of right eye 04/16/2024   Posterior subcapsular polar age-related cataract of left eye 04/16/2024   Posterior subcapsular polar age-related cataract of right eye 04/16/2024   Arthralgia of right knee 03/26/2024   Epiphora due to insufficient drainage of right side 03/02/2024   Posterior subcapsular age-related cataract of both eyes 03/02/2024   History of total left knee replacement 12/21/2023   Hamstring tendonitis 02/17/2023   Left patella fracture 05/27/2022   Acute bronchitis 02/18/2022   Loss of transverse plantar arch of right foot 12/17/2021   Arthralgia of left knee 12/02/2021   COVID-19 long hauler manifesting chronic cough 10/28/2021   History of COVID-19 10/28/2021   Effusion of left knee  09/09/2021   History of deep vein thrombosis (DVT) of lower extremity 05/13/2021   Degenerative cervical disc 01/16/2021   Right shoulder pain 01/16/2021   Degenerative arthritis of knee 05/28/2020   Spondylosis without myelopathy or radiculopathy, lumbar region 10/11/2016   Migraines 06/08/2016   Nuclear sclerotic cataract of both eyes 06/08/2016   Presbyopia 06/08/2016   Hypermetropia of both eyes 06/08/2016   Long term current use of anticoagulant 06/02/2016   Recurrent right inguinal hernia 12/05/2014   Chronic venous insufficiency 11/28/2014   Bradycardia 10/11/2012   Right leg DVT (HCC) 10/03/2012   HLD (hyperlipidemia) 07/25/2012    PCP: Cheryll Fausto Nasuti, MD  REFERRING PROVIDER:  Cheryll Fausto Nasuti, MD   REFERRING DIAG: [id number] (ICD-10-CM) - Presence of left artificial knee joint M87.08 (ICD-10-CM) - Idiopathic aseptic necrosis of bone, other site S76.112S (ICD-10-CM) - Strain of left quadriceps muscle, fascia and tendon, sequela  THERAPY DIAG:  Muscle weakness (generalized)  Pain in left leg  Abnormality of gait and mobility  Abnormal posture  Other muscle spasm  Unspecified lack of coordination  Rationale for Evaluation and Treatment: Rehabilitation  ONSET DATE: 10/28/24  SUBJECTIVE:   SUBJECTIVE STATEMENT: It's going slow Status post extensor mechanism reconstruction with patella bulk allograft 10/28/24. Status post 12 weeks cast immobilization full leg length, referral- work on quadriceps strength, hamstring and quadriceps isolation.  PERTINENT HISTORY: Lt TKA, patella fracture lt, DVT PAIN:  Are you having pain? 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 Heel toe gait training.  RED FLAGS: None   WEIGHT BEARING RESTRICTIONS: not listed  but per referral heel toe gait training and per pt and family as tolerated.   FALLS:  Has patient fallen in last 6 months? No  LIVING ENVIRONMENT: Lives with: lives with their family Lives in: Other main level - usually in recliner Stairs: 5to enter home but level for him inside with 2 handrails Has following equipment at home: Environmental Consultant - 2 wheeled  OCCUPATION: retired   PLOF: Independent  PATIENT GOALS: to walk more without having to stop, have better balance, go up and down stairs when able, feel better about walking out of the house or   NEXT MD VISIT: 02/20/25 (6 week follow up)  OBJECTIVE:  Note: Objective measures were completed at Evaluation unless otherwise noted.   PATIENT SURVEYS:  LEFS  Extreme difficulty/unable (0), Quite a bit of difficulty (1), Moderate difficulty (2), Little difficulty (3), No difficulty (4) Survey date:  01/10/25  Any of your usual work, housework or school activities 0  2. Usual hobbies, recreational or sporting activities   3. Getting into/out of the bath 1  4. Walking between rooms 1  5. Putting on socks/shoes 1  6. Squatting  0  7. Lifting an object, like a bag of groceries from the floor 1  8. Performing light activities around your home 0  9. Performing heavy activities around your home 0  10. Getting into/out of a car 1  11. Walking 2 blocks 0  12. Walking 1 mile 0  13. Going up/down 10 stairs (1 flight) 0  14. Standing for 1 hour 0  15.  sitting for 1 hour 2  16. Running on even ground 0  17. Running on uneven ground 0  18. Making sharp turns while running fast 0  19. Hopping  0  20. Rolling over in bed 2  Score total:  9/80   Lower Extremity Functional Score: 9 / 80 = 11.3 %  COGNITION: Overall cognitive status: Within functional limits for tasks assessed     SENSATION: WFL  EDEMA:  Mild swelling noted in Lt lower leg/ankle with skin indention from bracing but no swelling noted at lt knee and pt without pain, no redness  or warmth   POSTURE: rounded shoulders and forward head  PALPATION: No TTP  LOWER EXTREMITY ROM: RLE WFL   ROM Right eval Left eval  Hip flexion  90  Hip extension    Hip abduction    Hip adduction    Hip internal rotation    Hip external rotation    Knee flexion  30  Knee extension  0  Ankle dorsiflexion    Ankle plantarflexion    Ankle inversion    Ankle eversion     (Blank rows = not tested)  LOWER EXTREMITY MMT: RLE 4/5 throughout MMT Right eval Left eval  Hip flexion  3  Hip extension  3  Hip abduction  3+  Hip adduction  3+  Hip internal rotation    Hip external rotation    Knee flexion  2  Knee extension  2  Ankle dorsiflexion  3+  Ankle plantarflexion  3+  Ankle inversion  Ankle eversion     (Blank rows = not tested)   FUNCTIONAL TESTS:  5 times sit to stand: 19s Timed up and go (TUG): 22s  GAIT: Distance walked: 100' Assistive device utilized: None Level of assistance: SBA Comments: lateral trunk sway with limb advancement, decreased step length on Rt side, decreased cadence, trunk flexion, forward head, and decreased step height bil, poor heel toe pattern Lt                                                                                                                                TREATMENT DATE:  01/16/25 Seated heel slides to 30 deg on slider x 20 Heel raises x 20 at barre Weight shifts forward and lateral Gait to [employer] and back focusing on heel strike Gait with SPC - some trunk lean right Supine glute squeeze - x 20 Supine heel slide with brace x 20 Supine quad set 5 sec hold x 10 (fatigued) Ankle pumps x 20 Knee ext stretch on 1/2 foam roll 2 x  1 min Supine hip SLR x 10 Supine hip ABD x 10     01/10/25 Examination completed, findings reviewed, pt educated on POC, HEP. Pt motivated to participate in PT and agreeable to attempt recommendations.      PATIENT EDUCATION:  Education details: [id number] Person educated:  Patient Education method: Explanation, Demonstration, Tactile cues, Verbal cues, and Handouts Education comprehension: verbalized understanding, returned demonstration, verbal cues required, tactile cues required, and needs further education  HOME EXERCISE PROGRAM: Access Code: [facility] URL: [email or web address] Date: 01/16/2025 Prepared by: Mliss  Exercises - Heel slides with brace on - max 30 degrees  - 1 x daily - 7 x weekly - 2 sets - 10 reps - Long Sitting Quad Set  - 1 x daily - 7 x weekly - 2 sets - 10 reps - Long Sitting Calf Stretch with Strap  - 1 x daily - 7 x weekly - 1 sets - 3 reps - 30s holds - Seated heel slides with Towel - With Brace on  - 1 x daily - 7 x weekly - 2 sets - 10 reps - Quad Setting and Stretching  - 3 x daily - 7 x weekly - 1 sets - 3 reps - 1 min hold  ASSESSMENT:  CLINICAL IMPRESSION:  Gait pattern is slow, but with good form and heel strike. Would benefit from more gait training with [facility] for community. Fatigues quickly with quad sets and ext is somewhat limited. Stretch added to HEP. Sidelying hip ABD also challenging and required PT assist to maintain correct form. Pt denied ice today.   Eval: Patient is a 74 y.o. male  who was seen today for physical therapy evaluation and treatment status post extensor mechanism reconstruction with patella bulk allograft. Status post  12 weeks cast immobilization. Pt presents with Lt hinge brace in place with mobility locked at 30 knee flexion as per  MD precautions (listed above). Pt denies all pain and per pt and pt's brother - WBAT and per MD order work on heel toe gait training. Pt has FWW at home and has been using this until day of eval then came to clinic for eval without AD. Pt demonstrates impaired posture, balance, gait mechanics, increased fall risk with scoring on TUG and 5xSTS, decreased bil hip and knee strength however worse on Lt, pt does have quad activation but significantly weakened  compared to Rt. Pt also has decreased ROM on lt LE. Pt reports mobility impairments have decreased his ability to walk, stand, navigate stairs and curbs in home and community, complete ADLs, community errands (groceries, appointments), and would benefit from skilled PT to address these deficits and impairments.   OBJECTIVE IMPAIRMENTS: Abnormal gait, decreased activity tolerance, decreased balance, decreased coordination, decreased endurance, decreased knowledge of use of DME, decreased mobility, difficulty walking, decreased ROM, decreased strength, increased fascial restrictions, impaired perceived functional ability, impaired flexibility, improper body mechanics, and postural dysfunction.   ACTIVITY LIMITATIONS: carrying, lifting, bending, sitting, standing, squatting, sleeping, stairs, transfers, bed mobility, bathing, dressing, hygiene/grooming, and locomotion level  PARTICIPATION LIMITATIONS: cleaning, laundry, shopping, and community activity  PERSONAL FACTORS: 1 comorbidity: medical history are also affecting patient's functional outcome.   REHAB POTENTIAL: Good  CLINICAL DECISION MAKING: Stable/uncomplicated  EVALUATION COMPLEXITY: Low   GOALS: Goals reviewed with patient? Yes  SHORT TERM GOALS: Target date: 02/07/25 Pt to be I with HEP for carry over and continuing recommendations for improved outcomes.   Baseline: Goal status: INITIAL  2.  Pt to demonstrate improved heel toe gait pattern bil without compensation for at least 100' to improve ability to ambulate safely in home.  Baseline:  Goal status: INITIAL  3.  Pt to demonstrate improved 5xsts to no more than 10s for decreased fall risk Baseline:  Goal status: INITIAL   LONG TERM GOALS: Target date: 03/07/25  Pt to be I with advanced HEP for carry over and continuing recommendations for improved outcomes.   Baseline:  Goal status: INITIAL  2.  Pt to complete 6 MWT within age related norm to improve tolerance to  community ambulating.  Baseline:  Goal status: INITIAL  3.  Pt demonstrate x10 sit to stands without compensation for improved safety with transfers in home and car.  Baseline:  Goal status: INITIAL  4.  Pt to demonstrate improved posture without compensation to improve gait and standing mechanics to tolerate walking at least 30 mins.  Baseline:  Goal status: INITIAL  5.  Pt to demonstrate at least 120 degrees of knee flexion to improve I with stair navigation once cleared for this range of motion from MD.  Baseline:  Goal status: INITIAL  6.  Pt to demonstrate 5/5 knee strength bil for improved gait mechanics.  Baseline:  Goal status: INITIAL   PLAN:  PT FREQUENCY: 2x/week  PT DURATION: 8 weeks  PLANNED INTERVENTIONS: 97110-Therapeutic exercises, 97530- Therapeutic activity, [id number]- Neuromuscular re-education, 97535- Self Care, [id number]- Manual therapy, [phone]- Gait training, [phone]- Canalith repositioning, [id number]- Aquatic Therapy, [phone]- Electrical stimulation (manual), [id number]- Vasopneumatic device, [id number]- Ultrasound, [id number]- Ionotophoresis 4mg /ml Dexamethasone, [id number] (1-2 muscles), [id number] (3+ muscles)- Dry Needling, Patient/Family education, Balance training, Stair training, Taping, Joint mobilization, Joint manipulation, Spinal manipulation, Spinal mobilization, Scar mobilization, DME instructions, Cryotherapy, Moist heat, and Biofeedback  PLAN FOR NEXT SESSION: chronic DVT (no vaso), gait training, hip and knee stretching no more than 30 degrees knee flexion   Mliss Cummins, PT  02/04/261:26  PM  [facility] [address], [address] [place], [place] [address] Phone # [phone] Fax [phone]  "

## 2025-01-17 ENCOUNTER — Ambulatory Visit: Admitting: Family Medicine

## 2025-01-18 ENCOUNTER — Ambulatory Visit: Admitting: Physical Therapy

## 2025-01-18 ENCOUNTER — Encounter: Payer: Self-pay | Admitting: Physical Therapy

## 2025-01-18 DIAGNOSIS — R269 Unspecified abnormalities of gait and mobility: Secondary | ICD-10-CM

## 2025-01-18 DIAGNOSIS — M62838 Other muscle spasm: Secondary | ICD-10-CM

## 2025-01-18 DIAGNOSIS — R279 Unspecified lack of coordination: Secondary | ICD-10-CM

## 2025-01-18 DIAGNOSIS — M6281 Muscle weakness (generalized): Secondary | ICD-10-CM

## 2025-01-18 DIAGNOSIS — M79605 Pain in left leg: Secondary | ICD-10-CM

## 2025-01-18 DIAGNOSIS — R293 Abnormal posture: Secondary | ICD-10-CM

## 2025-01-18 NOTE — Therapy (Signed)
 " OUTPATIENT PHYSICAL THERAPY LOWER EXTREMITY TREATMENT   Patient Name: Andrew [name] MRN: [id number] DOB:1951-11-30, 74 y.o., male Today's Date: 01/18/2025  END OF SESSION:  PT End of Session - 01/18/25 0847     Visit Number 3    Date for Recertification  03/07/25    Authorization Type MEDICARE PART A AND B    PT Start Time 0847    PT Stop Time 0931    PT Time Calculation (min) 44 min    Activity Tolerance Patient tolerated treatment well    Behavior During Therapy [facility] for tasks assessed/performed            Past Medical History:  Diagnosis Date   Basal cell carcinoma    Chronic headaches    Colon polyps    DVT (deep venous thrombosis) (HCC)    right leg in 2011; prior injury to right foot   Echocardiogram 07/2021   Echocardiogram 8/22: EF 60-65, no RWMA, mild LVH, low normal RVSF, trivial AI, mild AV sclerosis w/o AS   HLD (hyperlipidemia)    Right foot injury    Past Surgical History:  Procedure Laterality Date   HERNIA REPAIR     NO PAST SURGERIES     Patient Active Problem List   Diagnosis Date Noted   BCC (basal cell carcinoma of skin) 07/04/2024   Small pupil 05/23/2024   Cortical age-related cataract of left eye 04/16/2024   Cortical age-related cataract of right eye 04/16/2024   Posterior subcapsular polar age-related cataract of left eye 04/16/2024   Posterior subcapsular polar age-related cataract of right eye 04/16/2024   Arthralgia of right knee 03/26/2024   Epiphora due to insufficient drainage of right side 03/02/2024   Posterior subcapsular age-related cataract of both eyes 03/02/2024   History of total left knee replacement 12/21/2023   Hamstring tendonitis 02/17/2023   Left patella fracture 05/27/2022   Acute bronchitis 02/18/2022   Loss of transverse plantar arch of right foot 12/17/2021   Arthralgia of left knee 12/02/2021   COVID-19 long hauler manifesting chronic cough 10/28/2021   History of COVID-19 10/28/2021   Effusion of left knee  09/09/2021   History of deep vein thrombosis (DVT) of lower extremity 05/13/2021   Degenerative cervical disc 01/16/2021   Right shoulder pain 01/16/2021   Degenerative arthritis of knee 05/28/2020   Spondylosis without myelopathy or radiculopathy, lumbar region 10/11/2016   Migraines 06/08/2016   Nuclear sclerotic cataract of both eyes 06/08/2016   Presbyopia 06/08/2016   Hypermetropia of both eyes 06/08/2016   Long term current use of anticoagulant 06/02/2016   Recurrent right inguinal hernia 12/05/2014   Chronic venous insufficiency 11/28/2014   Bradycardia 10/11/2012   Right leg DVT (HCC) 10/03/2012   HLD (hyperlipidemia) 07/25/2012    PCP: Cheryll Fausto Nasuti, MD  REFERRING PROVIDER:  Cheryll Fausto Nasuti, MD   REFERRING DIAG: [phone] (ICD-10-CM) - Presence of left artificial knee joint M87.08 (ICD-10-CM) - Idiopathic aseptic necrosis of bone, other site S76.112S (ICD-10-CM) - Strain of left quadriceps muscle, fascia and tendon, sequela  THERAPY DIAG:  Muscle weakness (generalized)  Pain in left leg  Abnormality of gait and mobility  Abnormal posture  Other muscle spasm  Unspecified lack of coordination  Rationale for Evaluation and Treatment: Rehabilitation  ONSET DATE: 10/28/24  SUBJECTIVE:   SUBJECTIVE STATEMENT: Increased swelling after last visit and some increased pain yesterday. Better today.  Status post extensor mechanism reconstruction with patella bulk allograft 10/28/24. Status post 12 weeks cast immobilization full  leg length, referral- work on quadriceps strength, hamstring and quadriceps isolation.  PERTINENT HISTORY: Lt TKA, patella fracture lt, DVT PAIN:  Are you having pain? 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  Heel toe gait training.  RED FLAGS: None   WEIGHT BEARING RESTRICTIONS: not listed but per referral heel toe gait training and per pt and family as tolerated.   FALLS:  Has patient fallen in last 6 months? No  LIVING ENVIRONMENT: Lives with: lives with their family Lives in: Other main level - usually in recliner Stairs: 5to enter home but level for him inside with 2 handrails Has following equipment at home: Environmental Consultant - 2 wheeled  OCCUPATION: retired   PLOF: Independent  PATIENT GOALS: to walk more without having to stop, have better balance, go up and down stairs when able, feel better about walking out of the house or   NEXT MD VISIT: 02/20/25 (6 week follow up)  OBJECTIVE:  Note: Objective measures were completed at Evaluation unless otherwise noted.   PATIENT SURVEYS:  LEFS  Extreme difficulty/unable (0), Quite a bit of difficulty (1), Moderate difficulty (2), Little difficulty (3), No difficulty (4) Survey date:  01/10/25  Any of your usual work, housework or school activities 0  2. Usual hobbies, recreational or sporting activities   3. Getting into/out of the bath 1  4. Walking between rooms 1  5. Putting on socks/shoes 1  6. Squatting  0  7. Lifting an object, like a bag of groceries from the floor 1  8. Performing light activities around your home 0  9. Performing heavy activities around your home 0  10. Getting into/out of a car 1  11. Walking 2 blocks 0  12. Walking 1 mile 0  13. Going up/down 10 stairs (1 flight) 0  14. Standing for 1 hour 0  15.  sitting for 1 hour 2  16. Running on even ground 0  17. Running on uneven ground 0  18. Making sharp turns while running fast 0  19. Hopping  0  20. Rolling over in bed 2  Score total:  9/80   Lower Extremity Functional Score: 9 / 80 = 11.3 %  COGNITION: Overall cognitive status: Within functional limits for tasks assessed     SENSATION: WFL  EDEMA:  Mild swelling noted in Lt lower leg/ankle with skin  indention from bracing but no swelling noted at lt knee and pt without pain, no redness or warmth   POSTURE: rounded shoulders and forward head  PALPATION: No TTP  LOWER EXTREMITY ROM: RLE WFL   ROM Right eval Left eval  Hip flexion  90  Hip extension    Hip abduction    Hip adduction    Hip internal rotation    Hip external rotation    Knee flexion  30  Knee extension  0  Ankle dorsiflexion    Ankle plantarflexion    Ankle inversion    Ankle eversion     (Blank rows = not tested)  LOWER EXTREMITY MMT: RLE 4/5 throughout MMT Right eval Left eval  Hip flexion  3  Hip extension  3  Hip abduction  3+  Hip adduction  3+  Hip internal rotation    Hip external rotation    Knee flexion  2  Knee extension  2  Ankle dorsiflexion  3+  Ankle plantarflexion  3+  Ankle inversion    Ankle eversion     (Blank rows = not tested)   FUNCTIONAL TESTS:  5 times sit to stand: 19s Timed up and go (TUG): 22s  GAIT: Distance walked: 100' Assistive device utilized: None Level of assistance: SBA Comments: lateral trunk sway with limb advancement, decreased step length on Rt side, decreased cadence, trunk flexion, forward head, and decreased step height bil, poor heel toe pattern Lt                                                                                                                                TREATMENT DATE:  01/18/25 Gait to Cancer gym and around the gym focusing on heel strike Seated heel slides to 30 deg on slider x 30 Heel raises x 20 at barre Toe raises with back against wall for quad set 2x10 Standing hip ABD 5 sec hold and ext 2x10 B Supine heel slide with brace x 20 Supine bridge with knees extended on peanut with brace x 10 Supine quad set 5 sec hold 2x 10 Passive Knee ext stretch on 1/2 foam roll 2 x  1 min    01/16/25 Seated heel slides to 30 deg on slider x 20 Heel raises x 20 at barre Weight shifts forward and lateral Gait to [facility] gym and  back focusing on heel strike Gait with SPC - some trunk lean right Supine glute squeeze - x 20 Supine heel slide with brace x 20 Supine quad set 5 sec hold x 10 (fatigued) Ankle pumps x 20 Knee ext stretch on 1/2 foam roll 2 x  1 min Supine hip SLR x 10 Supine hip ABD x 10     01/10/25 Examination completed, findings reviewed, pt educated on POC, HEP. Pt motivated to participate in PT and agreeable to attempt recommendations.      PATIENT EDUCATION:  Education details: [email or web address] Person educated: Patient Education method: Explanation, Demonstration, Tactile cues, Verbal cues, and Handouts Education comprehension: verbalized understanding, returned demonstration, verbal cues required, tactile cues required, and needs further education  HOME EXERCISE PROGRAM: Access Code: [facility] URL: [email or web address] Date: 01/18/2025 Prepared by: Mliss  Exercises - Heel slides with brace on - max 30 degrees  - 1 x daily - 7 x weekly - 2 sets - 10 reps - Long Sitting Quad Set  - 1 x daily - 7 x weekly - 2 sets - 10 reps - Long Sitting Calf Stretch with Strap  - 1 x daily - 7 x weekly - 1 sets - 3 reps - 30s holds - Seated heel slides with Towel - With Brace on  - 1 x daily - 7 x weekly - 2 sets - 10 reps - Quad Setting and Stretching  - 3 x daily - 7 x weekly - 1 sets - 3 reps - 1 min hold - Standing Bilateral  Ankle Dorsiflexion AROM  - 1-2 x daily - 7 x weekly - 2 sets - 10 reps  ASSESSMENT:  CLINICAL IMPRESSION:  Patient's son reports that patient had increased pain and swelling yesterday. He did not have any swelling after his last session. Upon discussion with the patient he reported that he was performing supine heel slides, SLR and S/L hip ABD at home with no brace yesterday. PT educated patient further on the importance of following his restrictions to avoid damaging his reconstruction. Patient tends to push hard and needs limits set to avoid overdoing things. He does  elevate the leg if it swells, but refuses to do ice. PT advised patient to only do prescribed exercises at this time and to stop if pain increases. He tolerated exercises well today and reported slight increase in pain at end of session to 6/10.    Eval: Patient is a 74 y.o. male  who was seen today for physical therapy evaluation and treatment status post extensor mechanism reconstruction with patella bulk allograft. Status post  12 weeks cast immobilization. Pt presents with Lt hinge brace in place with mobility locked at 30 knee flexion as per MD precautions (listed above). Pt denies all pain and per pt and pt's brother - WBAT and per MD order work on heel toe gait training. Pt has FWW at home and has been using this until day of eval then came to clinic for eval without AD. Pt demonstrates impaired posture, balance, gait mechanics, increased fall risk with scoring on TUG and 5xSTS, decreased bil hip and knee strength however worse on Lt, pt does have quad activation but significantly weakened compared to Rt. Pt also has decreased ROM on lt LE. Pt reports mobility impairments have decreased his ability to walk, stand, navigate stairs and curbs in home and community, complete ADLs, community errands (groceries, appointments), and would benefit from skilled PT to address these deficits and impairments.   OBJECTIVE IMPAIRMENTS: Abnormal gait, decreased activity tolerance, decreased balance, decreased coordination, decreased endurance, decreased knowledge of use of DME, decreased mobility, difficulty walking, decreased ROM, decreased strength, increased fascial restrictions, impaired perceived functional ability, impaired flexibility, improper body mechanics, and postural dysfunction.   ACTIVITY LIMITATIONS: carrying, lifting, bending, sitting, standing, squatting, sleeping, stairs, transfers, bed mobility, bathing, dressing, hygiene/grooming, and locomotion level  PARTICIPATION LIMITATIONS: cleaning,  laundry, shopping, and community activity  PERSONAL FACTORS: 1 comorbidity: medical history are also affecting patient's functional outcome.   REHAB POTENTIAL: Good  CLINICAL DECISION MAKING: Stable/uncomplicated  EVALUATION COMPLEXITY: Low   GOALS: Goals reviewed with patient? Yes  SHORT TERM GOALS: Target date: 02/07/25 Pt to be I with HEP for carry over and continuing recommendations for improved outcomes.   Baseline: Goal status: INITIAL  2.  Pt to demonstrate improved heel toe gait pattern bil without compensation for at least 100' to improve ability to ambulate safely in home.  Baseline:  Goal status: INITIAL  3.  Pt to demonstrate improved 5xsts to no more than 10s for decreased fall risk Baseline:  Goal status: INITIAL   LONG TERM GOALS: Target date: 03/07/25  Pt to be I with advanced HEP for carry over and continuing recommendations for improved outcomes.   Baseline:  Goal status: INITIAL  2.  Pt to complete 6 MWT within age related norm to improve tolerance to community ambulating.  Baseline:  Goal status: INITIAL  3.  Pt demonstrate x10 sit to stands without compensation for improved safety with transfers in home and car.  Baseline:  Goal status: INITIAL  4.  Pt to demonstrate improved posture without compensation to improve gait and standing mechanics to tolerate walking at least 30 mins.  Baseline:  Goal status: INITIAL  5.  Pt to demonstrate at least 120 degrees of knee flexion to improve I with stair navigation once cleared for this range of motion from MD.  Baseline:  Goal status: INITIAL  6.  Pt to demonstrate 5/5 knee strength bil for improved gait mechanics.  Baseline:  Goal status: INITIAL   PLAN:  PT FREQUENCY: 2x/week  PT DURATION: 8 weeks  PLANNED INTERVENTIONS: 97110-Therapeutic exercises, 97530- Therapeutic activity, [id number]- Neuromuscular re-education, 97535- Self Care, [id number]- Manual therapy, [id number]- Gait training, [phone]- Canalith  repositioning, [id number]- Aquatic Therapy, [phone]- Electrical stimulation (manual), [id number]- Vasopneumatic device, [id number]- Ultrasound, [id number]- Ionotophoresis 4mg /ml Dexamethasone, [id number] (1-2 muscles), [id number] (3+ muscles)- Dry Needling, Patient/Family education, Balance training, Stair training, Taping, Joint mobilization, Joint manipulation, Spinal manipulation, Spinal mobilization, Scar mobilization, DME instructions, Cryotherapy, Moist heat, and Biofeedback  PLAN FOR NEXT SESSION: Ensure patient is wearing his brace with exercise. chronic DVT (no vaso), gait training, hip and knee stretching no more than 30 degrees knee flexion   Mliss Cummins, PT  02/06/261:09 PM  [facility] [address] [place], [place] [address] Phone # [phone] Fax [phone]  "

## 2025-01-21 ENCOUNTER — Ambulatory Visit

## 2025-01-23 ENCOUNTER — Ambulatory Visit: Admitting: Orthopaedic Surgery

## 2025-01-24 ENCOUNTER — Ambulatory Visit

## 2025-01-28 ENCOUNTER — Ambulatory Visit

## 2025-01-30 ENCOUNTER — Ambulatory Visit: Admitting: Dermatology

## 2025-01-31 ENCOUNTER — Ambulatory Visit

## 2025-02-04 ENCOUNTER — Ambulatory Visit

## 2025-02-07 ENCOUNTER — Ambulatory Visit: Admitting: Physical Therapy

## 2025-02-11 ENCOUNTER — Ambulatory Visit

## 2025-02-18 ENCOUNTER — Ambulatory Visit

## 2025-02-21 ENCOUNTER — Ambulatory Visit

## 2025-02-28 ENCOUNTER — Ambulatory Visit

## 2025-03-04 ENCOUNTER — Ambulatory Visit

## 2025-03-07 ENCOUNTER — Ambulatory Visit
